# Patient Record
Sex: Female | Born: 1960 | Race: White | Hispanic: No | Marital: Married | Smoking: Current every day smoker
Health system: Southern US, Community
[De-identification: ages and names within clinical notes are randomized; demographics above are authoritative.]

## PROBLEM LIST (undated history)

## (undated) DIAGNOSIS — J449 Chronic obstructive pulmonary disease, unspecified: Secondary | ICD-10-CM

## (undated) DIAGNOSIS — R0600 Dyspnea, unspecified: Secondary | ICD-10-CM

## (undated) DIAGNOSIS — F32A Depression, unspecified: Secondary | ICD-10-CM

## (undated) DIAGNOSIS — Z923 Personal history of irradiation: Secondary | ICD-10-CM

## (undated) DIAGNOSIS — M810 Age-related osteoporosis without current pathological fracture: Secondary | ICD-10-CM

## (undated) DIAGNOSIS — T7840XA Allergy, unspecified, initial encounter: Secondary | ICD-10-CM

## (undated) DIAGNOSIS — C801 Malignant (primary) neoplasm, unspecified: Secondary | ICD-10-CM

## (undated) DIAGNOSIS — F419 Anxiety disorder, unspecified: Secondary | ICD-10-CM

## (undated) DIAGNOSIS — M797 Fibromyalgia: Secondary | ICD-10-CM

## (undated) DIAGNOSIS — K219 Gastro-esophageal reflux disease without esophagitis: Secondary | ICD-10-CM

## (undated) DIAGNOSIS — J439 Emphysema, unspecified: Secondary | ICD-10-CM

## (undated) DIAGNOSIS — M5136 Other intervertebral disc degeneration, lumbar region: Secondary | ICD-10-CM

## (undated) DIAGNOSIS — M419 Scoliosis, unspecified: Secondary | ICD-10-CM

## (undated) DIAGNOSIS — H269 Unspecified cataract: Secondary | ICD-10-CM

## (undated) DIAGNOSIS — C539 Malignant neoplasm of cervix uteri, unspecified: Secondary | ICD-10-CM

## (undated) DIAGNOSIS — F329 Major depressive disorder, single episode, unspecified: Secondary | ICD-10-CM

## (undated) DIAGNOSIS — M47812 Spondylosis without myelopathy or radiculopathy, cervical region: Secondary | ICD-10-CM

## (undated) DIAGNOSIS — M51369 Other intervertebral disc degeneration, lumbar region without mention of lumbar back pain or lower extremity pain: Secondary | ICD-10-CM

## (undated) DIAGNOSIS — G43019 Migraine without aura, intractable, without status migrainosus: Principal | ICD-10-CM

## (undated) DIAGNOSIS — M1990 Unspecified osteoarthritis, unspecified site: Secondary | ICD-10-CM

## (undated) DIAGNOSIS — I2510 Atherosclerotic heart disease of native coronary artery without angina pectoris: Secondary | ICD-10-CM

## (undated) HISTORY — DX: Chronic obstructive pulmonary disease, unspecified: J44.9

## (undated) HISTORY — PX: MULTIPLE TOOTH EXTRACTIONS: SHX2053

## (undated) HISTORY — PX: ABDOMINAL HYSTERECTOMY: SHX81

## (undated) HISTORY — DX: Age-related osteoporosis without current pathological fracture: M81.0

## (undated) HISTORY — PX: ULNAR NERVE TRANSPOSITION: SHX2595

## (undated) HISTORY — DX: Migraine without aura, intractable, without status migrainosus: G43.019

## (undated) HISTORY — DX: Major depressive disorder, single episode, unspecified: F32.9

## (undated) HISTORY — DX: Depression, unspecified: F32.A

## (undated) HISTORY — DX: Fibromyalgia: M79.7

## (undated) HISTORY — DX: Anxiety disorder, unspecified: F41.9

## (undated) HISTORY — PX: OTHER SURGICAL HISTORY: SHX169

## (undated) HISTORY — PX: COLONOSCOPY: SHX174

## (undated) HISTORY — DX: Unspecified cataract: H26.9

## (undated) HISTORY — PX: LUMBAR LAMINECTOMY: SHX95

## (undated) HISTORY — DX: Unspecified osteoarthritis, unspecified site: M19.90

## (undated) HISTORY — DX: Spondylosis without myelopathy or radiculopathy, cervical region: M47.812

## (undated) HISTORY — PX: JOINT REPLACEMENT: SHX530

## (undated) HISTORY — PX: TUBAL LIGATION: SHX77

## (undated) HISTORY — PX: CARPAL TUNNEL RELEASE: SHX101

## (undated) HISTORY — PX: SPINE SURGERY: SHX786

## (undated) HISTORY — DX: Malignant neoplasm of cervix uteri, unspecified: C53.9

## (undated) HISTORY — PX: NECK SURGERY: SHX720

## (undated) HISTORY — DX: Malignant (primary) neoplasm, unspecified: C80.1

## (undated) HISTORY — DX: Allergy, unspecified, initial encounter: T78.40XA

---

## 2001-08-16 ENCOUNTER — Ambulatory Visit (HOSPITAL_COMMUNITY): Admission: RE | Admit: 2001-08-16 | Discharge: 2001-08-16 | Payer: Self-pay | Admitting: Unknown Physician Specialty

## 2001-08-16 ENCOUNTER — Encounter: Payer: Self-pay | Admitting: Unknown Physician Specialty

## 2001-12-30 ENCOUNTER — Ambulatory Visit (HOSPITAL_COMMUNITY): Admission: RE | Admit: 2001-12-30 | Discharge: 2001-12-30 | Payer: Self-pay | Admitting: Family Medicine

## 2001-12-30 ENCOUNTER — Encounter: Payer: Self-pay | Admitting: Family Medicine

## 2003-05-16 ENCOUNTER — Encounter: Payer: Self-pay | Admitting: *Deleted

## 2003-05-16 ENCOUNTER — Emergency Department (HOSPITAL_COMMUNITY): Admission: EM | Admit: 2003-05-16 | Discharge: 2003-05-16 | Payer: Self-pay | Admitting: *Deleted

## 2004-03-17 ENCOUNTER — Inpatient Hospital Stay (HOSPITAL_COMMUNITY): Admission: RE | Admit: 2004-03-17 | Discharge: 2004-03-18 | Payer: Self-pay | Admitting: Specialist

## 2006-03-21 ENCOUNTER — Ambulatory Visit (HOSPITAL_BASED_OUTPATIENT_CLINIC_OR_DEPARTMENT_OTHER): Admission: RE | Admit: 2006-03-21 | Discharge: 2006-03-21 | Payer: Self-pay | Admitting: Orthopedic Surgery

## 2006-07-04 ENCOUNTER — Encounter (INDEPENDENT_AMBULATORY_CARE_PROVIDER_SITE_OTHER): Payer: Self-pay | Admitting: Specialist

## 2006-07-04 ENCOUNTER — Ambulatory Visit (HOSPITAL_COMMUNITY): Admission: RE | Admit: 2006-07-04 | Discharge: 2006-07-05 | Payer: Self-pay | Admitting: Specialist

## 2008-05-21 ENCOUNTER — Emergency Department (HOSPITAL_COMMUNITY): Admission: EM | Admit: 2008-05-21 | Discharge: 2008-05-21 | Payer: Self-pay | Admitting: Emergency Medicine

## 2009-03-03 ENCOUNTER — Ambulatory Visit: Admission: RE | Admit: 2009-03-03 | Discharge: 2009-03-03 | Payer: Self-pay | Admitting: Legal Medicine

## 2009-03-03 ENCOUNTER — Ambulatory Visit: Payer: Self-pay | Admitting: Vascular Surgery

## 2009-05-23 ENCOUNTER — Emergency Department (HOSPITAL_COMMUNITY): Admission: EM | Admit: 2009-05-23 | Discharge: 2009-05-24 | Payer: Self-pay | Admitting: Emergency Medicine

## 2010-03-11 ENCOUNTER — Encounter: Admission: RE | Admit: 2010-03-11 | Discharge: 2010-03-11 | Payer: Self-pay | Admitting: Specialist

## 2010-04-22 ENCOUNTER — Encounter: Admission: RE | Admit: 2010-04-22 | Discharge: 2010-04-22 | Payer: Self-pay | Admitting: Specialist

## 2010-05-25 ENCOUNTER — Ambulatory Visit (HOSPITAL_COMMUNITY): Admission: RE | Admit: 2010-05-25 | Discharge: 2010-05-27 | Payer: Self-pay | Admitting: Neurosurgery

## 2011-02-14 ENCOUNTER — Other Ambulatory Visit: Payer: Self-pay | Admitting: Neurosurgery

## 2011-02-14 DIAGNOSIS — M47812 Spondylosis without myelopathy or radiculopathy, cervical region: Secondary | ICD-10-CM

## 2011-02-14 DIAGNOSIS — M5412 Radiculopathy, cervical region: Secondary | ICD-10-CM

## 2011-02-17 ENCOUNTER — Ambulatory Visit
Admission: RE | Admit: 2011-02-17 | Discharge: 2011-02-17 | Disposition: A | Discharge status: Home / Self Care | Payer: BC Managed Care – PPO | Source: Ambulatory Visit | Attending: Neurosurgery | Admitting: Neurosurgery

## 2011-02-17 DIAGNOSIS — M47812 Spondylosis without myelopathy or radiculopathy, cervical region: Secondary | ICD-10-CM

## 2011-02-17 DIAGNOSIS — M5412 Radiculopathy, cervical region: Secondary | ICD-10-CM

## 2011-02-27 ENCOUNTER — Other Ambulatory Visit: Payer: Self-pay | Admitting: Neurosurgery

## 2011-02-27 DIAGNOSIS — M47812 Spondylosis without myelopathy or radiculopathy, cervical region: Secondary | ICD-10-CM

## 2011-02-27 DIAGNOSIS — M5412 Radiculopathy, cervical region: Secondary | ICD-10-CM

## 2011-03-08 ENCOUNTER — Other Ambulatory Visit: Payer: BC Managed Care – PPO

## 2011-03-13 ENCOUNTER — Ambulatory Visit
Admission: RE | Admit: 2011-03-13 | Discharge: 2011-03-13 | Disposition: A | Discharge status: Home / Self Care | Payer: BC Managed Care – PPO | Source: Ambulatory Visit | Attending: Neurosurgery | Admitting: Neurosurgery

## 2011-03-13 DIAGNOSIS — M47812 Spondylosis without myelopathy or radiculopathy, cervical region: Secondary | ICD-10-CM

## 2011-03-13 DIAGNOSIS — M5412 Radiculopathy, cervical region: Secondary | ICD-10-CM

## 2011-03-13 LAB — ABO/RH: ABO/RH(D): A POS

## 2011-03-13 LAB — TYPE AND SCREEN

## 2011-03-13 LAB — SURGICAL PCR SCREEN: Staphylococcus aureus: POSITIVE — AB

## 2011-03-30 ENCOUNTER — Other Ambulatory Visit: Payer: Self-pay | Admitting: Neurosurgery

## 2011-03-30 DIAGNOSIS — M47812 Spondylosis without myelopathy or radiculopathy, cervical region: Secondary | ICD-10-CM

## 2011-03-30 DIAGNOSIS — M5412 Radiculopathy, cervical region: Secondary | ICD-10-CM

## 2011-04-04 LAB — BASIC METABOLIC PANEL
BUN: 14 mg/dL (ref 6–23)
CO2: 22 mEq/L (ref 19–32)
Chloride: 107 mEq/L (ref 96–112)
Glucose, Bld: 72 mg/dL (ref 70–99)
Sodium: 138 mEq/L (ref 135–145)

## 2011-04-04 LAB — CBC
MCHC: 34.6 g/dL (ref 30.0–36.0)
Platelets: 284 10*3/uL (ref 150–400)
RBC: 3.89 MIL/uL (ref 3.87–5.11)
RDW: 14.7 % (ref 11.5–15.5)

## 2011-04-04 LAB — URINALYSIS, ROUTINE W REFLEX MICROSCOPIC
Bilirubin Urine: NEGATIVE
Protein, ur: NEGATIVE mg/dL
Specific Gravity, Urine: 1.013 (ref 1.005–1.030)

## 2011-04-04 LAB — DIFFERENTIAL
Basophils Absolute: 0.1 10*3/uL (ref 0.0–0.1)
Eosinophils Absolute: 0.1 10*3/uL (ref 0.0–0.7)
Eosinophils Relative: 1 % (ref 0–5)
Lymphocytes Relative: 22 % (ref 12–46)
Neutro Abs: 7.2 10*3/uL (ref 1.7–7.7)

## 2011-04-04 LAB — URINE MICROSCOPIC-ADD ON

## 2011-04-04 LAB — POCT PREGNANCY, URINE: Preg Test, Ur: NEGATIVE

## 2011-04-05 ENCOUNTER — Other Ambulatory Visit: Payer: Self-pay | Admitting: Gastroenterology

## 2011-04-07 ENCOUNTER — Ambulatory Visit
Admission: RE | Admit: 2011-04-07 | Discharge: 2011-04-07 | Disposition: A | Discharge status: Home / Self Care | Payer: BC Managed Care – PPO | Source: Ambulatory Visit | Attending: Gastroenterology | Admitting: Gastroenterology

## 2011-04-07 MED ORDER — IOHEXOL 300 MG/ML  SOLN
100.0000 mL | Freq: Once | INTRAMUSCULAR | Status: AC | PRN
  Administered 2011-04-07: 100 mL via INTRAVENOUS

## 2011-04-14 ENCOUNTER — Ambulatory Visit
Admission: RE | Admit: 2011-04-14 | Discharge: 2011-04-14 | Disposition: A | Discharge status: Home / Self Care | Payer: BC Managed Care – PPO | Source: Ambulatory Visit | Attending: Neurosurgery | Admitting: Neurosurgery

## 2011-04-14 DIAGNOSIS — M5412 Radiculopathy, cervical region: Secondary | ICD-10-CM

## 2011-04-14 DIAGNOSIS — M47812 Spondylosis without myelopathy or radiculopathy, cervical region: Secondary | ICD-10-CM

## 2011-05-12 NOTE — Op Note (Signed)
NAMEMILIA, WARTH                  ACCOUNT NO.:  [id number]   MEDICAL RECORD NO.:  [id number]          PATIENT TYPE:  OIB   LOCATION:  1608                         FACILITY:  [facility]   PHYSICIAN:  Jene Every, M.D.    DATE OF BIRTH:  Aug 15, 1961   DATE OF PROCEDURE:  07/04/2006  DATE OF DISCHARGE:                                 OPERATIVE REPORT   PREOPERATIVE DIAGNOSES:  1.  Spinal stenosis.  2.  Herniated nucleus pulposus at L4-5, L5-S1.   POSTOPERATIVE DIAGNOSES:  1.  Spinal stenosis.  2.  Herniated nucleus pulposus at L4-5, L5-S1.   PROCEDURE PERFORMED:  Decompression L4-5, L5-S1 right with hemilaminectomy  of L5, foraminotomies L4 and L5, microdiskectomy L4-5.   ANESTHESIA:  General.   ASSISTANT:  Roma Schanz, P.A.   BRIEF HISTORY INDICATION:  This is a 50 year old with right lower extremity  radicular pain L5 nerve root distribution, HNP noted on MRI.  She initially  responded to epidural steroid injections then had a worsening of her  condition and despite conservative treatment and an escalation of pain on  medication, she had continued pain and a positive straight leg raise and EHL  weakness.  Decreased sensation in the L5 dermatome.  MRI indicated  significant disk degeneration 4-5 and 5-1, a small fragment migrating caudal  from the 4-5 disk down to the foramen of 5.  She had minimal low back,  interestingly, had no fevers or chills.  I discussed decompression by  microdiskectomy and removal of the fragment, it was predominantly L5 leg  pain, but I did indicate she may require a decompression and a fusion at two  levels in the future, though she has disk degeneration at 3-4 and is a  smoker as well, and we elected again just to decompress it for 5, but I  indicated to Ms. Bain that decompression will most likely require evaluation  of L5 S1 as well as L4-5 due to the caudad migration of the fragment of 5.  Risks and benefits also discussed including bleeding,  infection, damage to  nerve structures, CSF leakage, epidural fibrosis, adjacent segment disease,  need for fusion in the future, and anesthetic complications, etc.   TECHNIQUE:  With the patient in the supine position, after induction of  adequate general anesthesia and 1 g of  Kefzol, she was placed prone on the  [place] frame, __________ position, all bony prominences were well padded.  The lumbar region was thoroughly prepped and draped in the usual sterile  fashion.  Two 18 gauge spinal needles were utilized to localize the 4-5 and  5-1 interspace, confirmed with an x-ray.  An incision was made from the  spinous process of 4 to just below 5.  Subcutaneous tissue was dissected.  Electrocautery was utilized to achieve hemostasis.  Dorsal lumbar fascia  identified, divided in line with the skin incision.  Just prior to this,  0.25% Marcaine with epinephrine was infiltrated in the paraspinous  musculature.  The paraspinous musculature elevated from the lamina L4 and  L5.  McCullough retractor was placed.  Operating microscope  was then  straightened, brought into the surgical field and a Penfield 4 was placed  and the interspace of 4-5 and 5-1 confirmed with x-ray.  Notable is a small  intraluminal space.  First, hemilaminotomy of the caudad edge of 5 was  performed with a 2 mm Kerrison.  Detaching the ligamentum flavum, there was  hypertrophic ligamentum and facet hypertrophy.  I performed a hemilaminotomy  of the caudad edge of 4 as well, removed the ligamentum flavum from the  interspace.  There was severe lateral recess stenosis noted due to a  combination of ligamentum flavum hypertrophy, facet hypertrophy and a  fragment that appeared to be migrating caudad.  To expose the fragment, we  placed neuropatties beneath the 5 remaining lamina and continued caudad with  the 2 mm Kerrison to expose the caudad migration.  This required the  hemilaminectomy at L5, just to trace the L5 nerve  root out.  I mobilized the  fragment with a nerve hook and a micropituitary and retrieved multiple  fragments from beneath the nerve root and out into the foramen of L5.  There  was epidural venous plexus and electrocautery was utilized to achieve  hemostasis.  At the disk space of 5 there was rupture as well and the  annular tear was noted, I completed this with an annulotomy and removed disk  material from the subannular, subposterior longitudinal ligament with a  nerve hook.  I retrieved the fragments and sent them to pathology.  This was  also retrieved from out of the foramen of L4.  The disk space was  significantly narrowed.  Some was retrieved from the disk space.  There was  no residual disk herniation after the diskectomy.  I did remove some of the  ligamentum flavum from out of the foramen of 4.  Following the foraminotomy  of 4 and 5, the hockey stick probe placed freely on the foramen of 4 and 5.  There was good excursion of the 5, at least 1 cm medial to the pedicle.  There was no stenosis cephalad.  We copiously irrigated the disk space with  antibiotic irrigation.  Ligamentum flavum was detached from the lateral edge  of the space of 5-1 as well.  We placed a hockey stick probe down the  foramen of S1.  It was found to be widely patent.  Some ligamentum flavum  inflowed within the 5 foramen and this was decompressed with a 2 mm  Kerrison, again over the root of 5, protecting the root at all times.  Again  the wound copiously irrigated.  All 4, 5 and S1 foramen were widely patent.  No evidence of active bleeding, no evidence of CSF leakage.  Thrombin-soaked  Gelfoam was placed in laminotomy defect.  McCullough retractor was removed,  paraspinous muscles inspected and with no active bleeding.  Dorsolumbar  fascia reapproximated with #1 Vicryl and interrupted figure-of-eight  sutures.  The subcutaneous tissue reapproximated with #2-0 Vicryl simple sutures.  Skin was  reapproximated with #4-0 subcuticular Prolene and  reinforced with Steri-Strips.  A sterile dressing applied.  Placed supine on  the hospital bed, extubated without difficulty and transported to the  recovery room in satisfactory condition.   The patient tolerated the procedure well, there were no complications.   SPECIMEN:  Disk material from L4-5 space.      Jene Every, M.D.  Electronically Signed     JB/MEDQ  D:  07/04/2006  T:  07/04/2006  Job:  [id number] 

## 2011-05-12 NOTE — Op Note (Signed)
[name], [name]                  ACCOUNT NO.:  [id number]   MEDICAL RECORD NO.:  [id number]          PATIENT TYPE:  AMB   LOCATION:  DSC                          FACILITY:  [facility]   PHYSICIAN:  Cindee Salt, M.D.       DATE OF BIRTH:  1961/09/04   DATE OF PROCEDURE:  03/21/2006  DATE OF DISCHARGE:                                 OPERATIVE REPORT   PREOPERATIVE DIAGNOSIS:  Fracture-dislocation, proximal interphalangeal  joint, left ring finger.   POSTOPERATIVE DIAGNOSIS:  Fracture-dislocation, proximal interphalangeal  joint, left ring finger.   OPERATION:  Hemi-hamate arthroplasty, left ring finger.   SURGEON:  Cindee Salt, M.D.   ASSISTANT:  Artist Pais. Mina Marble, M.D.   ANESTHESIA:  Axillary general.   HISTORY:  The patient is a 50 year old female who suffered a fracture-  dislocation to the PIP joint, left ring finger.  This went undiagnosed.  She  was seen approximately three months after injury.  She is admitted for hemi-  hamate arthroplasty, left ring finger.   PROCEDURE:  The patient was seen in the preoperative area, questions  answered, the area marked by both patient and surgeon, and she was taken to  the operating room after an axillary block was carried.  A general  anesthetic was then used.  She was prepped and draped using DuraPrep, supine  position, left arm free.  The limb was exsanguinated with an Esmarch  bandage.  A tourniquet placed high on the arm was inflated to 250 mmHg.  A  volar Brunner incision was made over the PIP joint, carried down through  subcutaneous tissue.  Neurovascular structures identified.  Significant  scarring near the volar plate was noted after opening the C3 pulley.  The  flexor tendons were then retracted.  The collateral ligaments were incised.  The volar plate was dissected free from the healed volar plate fracture.  This allowed the joint to be opened.  Significant scarring was present about  the entire volar aspect of the finger.   The neurovascular bundles where  released, allowing them to be retracted along with the flexor tendons, and  the joint was opened in a shotgun manner allowing visualization of the  healed volar fracture.  There were changes on the distal portion of the  proximal phalanx., But it was deemed to attempt reconstruction rather than  joint replacement.  The old fractured healed bone was removed with a small  rongeur.  The defect was then measured, found to be 1 x 5 x 3 mm in depth.  A separate incision was then made over the hamate-metacarpal articulation  transversely through the skin, the subcutaneous tissue was spread,  neurovascular structures identified and protected, retractors placed,  dissection carried between the extensor digiti quinti and extensor of the  little and ring fingers.  An incision was made longitudinally directly over  the hamate.  The hamate-metacarpal articulation was immediately apparent.  The junction between the ring and little finger metacarpal bases was  identified.  An oscillating saw was then used to cut a 1 x 5 x 3 mm  depth  graft.  The proximal aspect was then undermined making a trough allowing a  curved osteotome to be placed, and the graft was removed without difficulty.  This measured approximately 5 mm in depth.  A rongeur was then used to  remove the 2 mm, squaring this off for acceptance into the trough of the  middle phalanx.  This was placed and pinned with a 0.028 K-wire.  X-rays  confirmed positioning, and this was pinned so as to allow the articular  cartilage to be the equal but the bone to be unequal on x-ray.  This was  found to be acceptable position.  The finger was able to be put through a  range of motion, appeared stable.  Drill holes were then made for a modular  handset 1.1 mm screw.  Two 8 and one 9 mm screws were then inserted after  removal of the central K-wire.  This was removed as the last 9 mm screw.  X-  rays confirmed positioning  of the fracture fragment in good position.  The  articular surface looked very good and the joint articulated well with  approximately 100 degrees of flexion, full extension.  There was no  subluxation.  The distal aspect of the bone graft was trimmed to allow a  smoother contour to the flexor tendons.  These were repositioned.  The area  was then copiously irrigated with saline and the skin closed with  interrupted 5-0 nylon sutures.  The donor site periosteum was closed with  figure-of-eight 4-0 Vicryl and the skin with interrupted 5-0 nylon sutures.  Sterile compressive dressing, dorsal splint applied maintaining the finger  in 15 degrees of flexion at the PIP joint.  On deflation of the tourniquet,  finger immediately pinked.  She was taken to the recovery room for  observation in satisfactory condition.  She is discharged home to return to  the [facility] in one week on Vicodin and Medrol Dosepak.           ______________________________  Cindee Salt, M.D.     GK/MEDQ  D:  03/21/2006  T:  03/22/2006  Job:  [id number]

## 2011-05-12 NOTE — Op Note (Signed)
NAMECINDA, HARA                            ACCOUNT NO.:  [id number]   MEDICAL RECORD NO.:  [id number]                   PATIENT TYPE:  INP   LOCATION:  0001                                 FACILITY:  [facility]   PHYSICIAN:  Kerrin Champagne, M.D.                DATE OF BIRTH:  February 01, 1961   DATE OF PROCEDURE:  03/17/2004  DATE OF DISCHARGE:                                 OPERATIVE REPORT   PREOPERATIVE DIAGNOSES:  Left sided C5-6 and C6-7 foraminal entrapment of  the left C6 and C7 nerve roots due to spondylosis changes and degenerative  disk disease.   POSTOPERATIVE DIAGNOSES:  Left sided C5-6 and C6-7 foraminal entrapment of  the left C6 and C7 nerve roots due to spondylosis changes and degenerative  disk disease.   PROCEDURE:  Left C5-6 and left C6-7 [place] foraminotomies.   SURGEON:  Kerrin Champagne, M.D.   ASSISTANT:  Wende Neighbors, P.A.   ANESTHESIA:  GOT, Dr. Almeta Monas.   ESTIMATED BLOOD LOSS:  100 mL   DRAINS:  Foley to straight drain.   BRIEF CLINICAL NOTE:  This patient is a 50 year old female who presents with  chief complaint of severe neck pain with radiation in the left upper  extremity.  She has undergone attempts at conservative management and indeed  has undergone extensive workup.  Initially seen by Dr. Jene Every and  evaluated, underwent trigger point injection and workup of her shoulder;  however, with persistent pain, MRI scan showed apparent protrusion of the  disk at C3-4 but more significant findings of foraminal entrapment on the  left side C5-6 and C6-7.  The patient was felt to have left sided  neuroforaminal narrowing at C6-7 as well as C5-6.  She underwent EMG's and  nerve conduction studies which were inconclusive.  She had persistent  disabling axial cervical pain in the left C7 radiculopathy.  After attempts  at selective nerve root block and only temporized discomfort is brought to  the operating room to undergo left C5-6 and left C6-7  [place]  foraminotomies for decompression of the left C6 and C7 nerve roots.   FINDINGS:  The patient was found to have significant foraminal entrapment  affecting the left C6 and C7 nerve roots.   DESCRIPTION OF PROCEDURE:  After adequate general anesthesia, the patient in  a prone position, chest rolls, pressure points well-padded, a Foley catheter  placed prior to turning to a prone position and she was turned following  intubation. The neck in slight flexion of about 20-30 degrees, the face  balanced on Mayfield horseshoe well padded and well away from the orbits.  Shoulders placed backwards slightly into slight military posture with pads  under the anterior shoulders.  Draped circumferentially padding the elbows  and arms appropriately.  The patient had TED hose preoperatively to prevent  DVT.  Standard preoperative antibiotics of Ancef.  A  standard prep over the  posterior cervical spine extending from the lower occiput to T3, draped in  the usual manner, iodine Vidrape was used.  An incision in the midline from  the expected C7 spinous process cranially approximately 2-1/2 inches in  length after infiltration with Marcaine 0.5% with 1:200,000 epinephrine 7  mL.  The incision was carried sharply through the skin and subcutaneous  layers directly down to the ligament of nuchae and this was incised along  the left side of the spinous process at the expected C7, C6, C5 levels.  A  towel clip placed over the expected spinous process of C6 and an Allis clamp  over the spinous process of C5.  Intraoperative lateral radiograph was able  to demonstrate the Allis clamp on C5 and the towel clip on the C6 spinous  process. These areas were marked with colored #0 Vicryl stitch for continued  identification throughout the procedure. Carefully the paracervical muscles  were elevated off the left side of the lamina of C5, C6 and C7 and divided  off of their insertion into the inferior aspect of  the lamina of C5, C6 and  C7 exposing the left C5-6 and C6-7 posterior interlaminar regions.   Bleeders controlled using bipolar and monopolar electrocautery. McCullough  retractor inserted.  Loupe magnification and a knife was used during this  portion of the procedure. A 4 mm diamond tip bur was then used to carefully  perform small partial inferomedial laminotomy over the inferomedial aspect  of the lamina of C5 exposing the superior articular process of C6 on the  left side.  This was then carefully thinned and similarly the high speed bur  used to perform small opening over the inferomedial aspect of the lamina of  C6 at the C6-7 level.  Again taking this down to expose the superior  articular process of C7 on the left side at its superior lateral extent of  the superior lamina of C7.  A 1 mm Kerrison then able to be introduced and  used to debride a small portion of the lamina of C5 inferiorly and laterally  to the level of the C5-6 foramen.  A small portion of the superior aspect of  the lamina of C6 was resected as well to the border of the medial aspect of  the C6 pedicle.  The superior articular process of C6 then resected medially  over approximately 30-40% allowing for a decompression of the neuroforamen  on the left side at C5-6. Carefully the ligamentum flavum that had been  freed up with the partial laminectomies at each level was then lifted using  a small titanium nerve hook. The operating room microscope was draped and  brought in the field. Under direct visualization with lifting the ligamentum  flavum, the dural veins were cauterized and the ligamentum flavum carefully  elevated superiorly. This was still left in place, allowed to remain in  place as a protection from scar.  The epidural veins were then carefully  elevated, cauterized and divided over the lateral aspect of the thecal sac extending out over the left C6 nerve root dividing the fibrovascular leash,  which  was preventing the nerve from floating posterior to osteophytes within  the left C5-6 neuroforamen.   This then completed, a titanium nerve hook was then carefully used to probe  along the superomedial border of the C6 pedicle feeling the uncovertebral  osteophytes and noted no soft disk protrusion felt to be present.   The  epidural veins were cauterized using bipolar electrocautery. Thrombin  soaked Gelfoam and bone wax applied to the bleeding cancellous bone  surfaces.  This then completed, it was felt that adequate foraminotomy was  completed at C5-6, attention turned to the C6-7 level with the microscope.   The high speed bur was used to further thin the inferomedial aspect of the  inferior articular process of C6.  One and two millimeter Kerrison's then  used to perform foraminotomy on the left side resecting the superior  articular process of C7 on the left side over 30-40% of its medial portion  allowing for decompression of the left C6-7 neuroforamen. Carefully the  ligamentum flavum on the left side was then elevated using a titanium nerve  hook and bipolar electrocautery used to cauterize the epidural veins along  the left side carefully lifting and freeing the ligamentum flavum cranially.  The interval between the thecal sac and the epidural veins then developed  using titanium nerve hook and then cauterizing and dividing out over the  nerve root into the neural foramen resecting and dividing the fibrovascular  leash holding the nerve against the uncovertebral joint anteriorly.  Following this the C7 nerve root was felt to be adequately decompressed, a  large regular nerve hook could be passed easily out the neuroforamen over  the posterior aspect of the nerve root at C6 and C7.  Bleeders controlled  using electrocautery and then when these were completed bone wax applied to  bleeding cancellous bone surfaces and excess bone wax removed. Thrombin  soaked Gelfoam then placed  over both foraminotomy at these levels.  Irrigation performed.  Following irrigation, excess Gelfoam was removed.  The retractor removed, inspection demonstrated some small bleeders in the  areas of the attachment of the paracervical muscle and these were cauterized  appropriately. When no bleeding was evident, the closure was performed by  approximating the ligament of nuchae in the midline with interrupted #1  Ethibond sutures in inverted and simple fashion.  The layer just superficial  to the ligament of nuchae was also approximated with #1 Ethibond sutures.  The deep subcu layers approximated with a #0 Vicryl stitch using the UR-6  needle.  The subcu layers approximated with interrupted 2-0 Vicryl sutures,  and skin closed with inverted sutures of 4-0 Vicryl. Tincture of Benzoin and  Steri-Strips applied.   The patient had 4 x 4's affixed to the skin with Hypafix tape. She was then returned to a supine position, reactivated, extubated and returned to the  recovery room in satisfactory condition.  All instrument and sponge counts  were correct.                                               Kerrin Champagne, M.D.    Myra Rude  D:  03/17/2004  T:  03/17/2004  Job:  [id number]

## 2011-09-20 LAB — CBC
HCT: 37.7
Hemoglobin: 13
MCHC: 34.4
MCV: 94.3
Platelets: 370
RDW: 14.4

## 2011-09-20 LAB — DIFFERENTIAL
Basophils Absolute: 0
Basophils Relative: 1
Lymphocytes Relative: 28
Monocytes Absolute: 0.4
Monocytes Relative: 5
Neutro Abs: 4.9
Neutrophils Relative %: 61

## 2011-09-20 LAB — COMPREHENSIVE METABOLIC PANEL
Albumin: 3.3 — ABNORMAL LOW
Alkaline Phosphatase: 71
BUN: 6
Creatinine, Ser: 0.64
Glucose, Bld: 84
Total Protein: 5.5 — ABNORMAL LOW

## 2011-10-23 ENCOUNTER — Other Ambulatory Visit: Payer: Self-pay | Admitting: Family Medicine

## 2011-10-25 ENCOUNTER — Other Ambulatory Visit: Payer: BC Managed Care – PPO

## 2012-10-23 ENCOUNTER — Ambulatory Visit: Payer: BC Managed Care – PPO

## 2012-10-23 ENCOUNTER — Ambulatory Visit (INDEPENDENT_AMBULATORY_CARE_PROVIDER_SITE_OTHER): Payer: BC Managed Care – PPO | Admitting: Family Medicine

## 2012-10-23 VITALS — BP 128/78 | HR 85 | Temp 98.0°F | Resp 17 | Ht 64.5 in | Wt 139.0 lb

## 2012-10-23 DIAGNOSIS — M549 Dorsalgia, unspecified: Secondary | ICD-10-CM

## 2012-10-23 DIAGNOSIS — IMO0002 Reserved for concepts with insufficient information to code with codable children: Secondary | ICD-10-CM

## 2012-10-23 DIAGNOSIS — M542 Cervicalgia: Secondary | ICD-10-CM

## 2012-10-23 DIAGNOSIS — Z79899 Other long term (current) drug therapy: Secondary | ICD-10-CM

## 2012-10-23 DIAGNOSIS — Z23 Encounter for immunization: Secondary | ICD-10-CM

## 2012-10-23 LAB — POCT UA - MICROSCOPIC ONLY: Crystals, Ur, HPF, POC: NEGATIVE

## 2012-10-23 LAB — POCT URINALYSIS DIPSTICK
Bilirubin, UA: NEGATIVE
Leukocytes, UA: NEGATIVE
Spec Grav, UA: 1.025

## 2012-10-23 MED ORDER — KETOROLAC TROMETHAMINE 60 MG/2ML IM SOLN
60.0000 mg | Freq: Once | INTRAMUSCULAR | Status: AC
  Administered 2012-10-23: 60 mg via INTRAMUSCULAR

## 2012-10-23 MED ORDER — TETANUS-DIPHTH-ACELL PERTUSSIS 5-2.5-18.5 LF-MCG/0.5 IM SUSP: 0.5000 mL | Freq: Once | INTRAMUSCULAR | Status: DC

## 2012-10-23 MED ORDER — PREDNISONE 10 MG PO TABS: ORAL_TABLET | ORAL | Status: DC

## 2012-10-23 MED ORDER — TRAMADOL HCL 50 MG PO TABS: 50.0000 mg | ORAL_TABLET | Freq: Three times a day (TID) | ORAL | Status: DC | PRN

## 2012-10-23 NOTE — Progress Notes (Signed)
Subjective:    Patient ID: [name], female    DOB: [birth year], 51 y.o.   MRN: [id number] Chief Complaint  Patient presents with  . Fall  . Neck Pain    previous neck surgery  . Back Pain  . Flank Pain    HPI    Review of Systems    BP 128/78  Pulse 85  Temp(Src) 98 F (36.7 C) (Oral)  Resp 17  Ht 5' 4.5" (1.638 m)  Wt 139 lb (63.05 kg)  BMI 23.5 kg/m2  SpO2 96% Objective:   Physical Exam    [facility] reading (PRIMARY) by  Dr. Clelia Croft.     C-spine xray: loss of nml c-spine lordosis.  S/p lower c-spine fusion. No acute abnormalities seen. L-spine xray - multilever deg disc disease. No acute abnormality. UDS: + THC and opiates and benzos. Results for orders placed in visit on 10/23/12  POCT UA - MICROSCOPIC ONLY      Component Value Range   WBC, Ur, HPF, POC 2-4     RBC, urine, microscopic 1-7     Bacteria, U Microscopic trace     Mucus, UA trace     Epithelial cells, urine per micros 1-5     Crystals, Ur, HPF, POC neg     Casts, Ur, LPF, POC neg     Yeast, UA neg    POCT URINALYSIS DIPSTICK      Component Value Range   Color, UA amber     Clarity, UA clear     Glucose, UA neg     Bilirubin, UA neg     Ketones, UA trace     Spec Grav, UA 1.025     Blood, UA neg     pH, UA 6.0     Protein, UA neg     Urobilinogen, UA 1.0     Nitrite, UA neg     Leukocytes, UA Negative       Assessment & Plan:  Laceration - Plan: TDaP (BOOSTRIX) injection 0.5 mL  Neck pain - Plan: DG Cervical Spine Complete, ketorolac (TORADOL) injection 60 mg  Back pain - Plan: POCT UA - Microscopic Only, POCT urinalysis dipstick, DG Lumbar Spine Complete  Encounter for long-term (current) use of other medications  Meds ordered this encounter  Medications  . DISCONTD: SUMAtriptan (IMITREX) 5 MG/ACT nasal spray    Sig: Place 1 spray into the nose every 2 (two) hours as needed.  . gabapentin (NEURONTIN) 800 MG tablet    Sig: Take 800 mg by mouth 3 (three) times daily.  .  metaxalone (SKELAXIN) 800 MG tablet    Sig: Take 800 mg by mouth 3 (three) times daily.  . DULoxetine (CYMBALTA) 60 MG capsule    Sig: Take 60 mg by mouth daily.  Marland Kitchen zonisamide (ZONEGRAN) 100 MG capsule    Sig: Take 100 mg by mouth 2 (two) times daily.   Marland Kitchen LORazepam (ATIVAN) 2 MG tablet    Sig: Take 2 mg by mouth every 6 (six) hours as needed.  Marland Kitchen aspirin 81 MG tablet    Sig: Take 81 mg by mouth daily.  . budesonide-formoterol (SYMBICORT) 160-4.5 MCG/ACT inhaler    Sig: Inhale 2 puffs into the lungs 2 (two) times daily.  Marland Kitchen albuterol (PROVENTIL) (5 MG/ML) 0.5% nebulizer solution    Sig: Take 2.5 mg by nebulization every 6 (six) hours as needed.  . TDaP (BOOSTRIX) injection 0.5 mL    Sig:   . DISCONTD: predniSONE (DELTASONE)  10 MG tablet    Sig: Take 6 tabs po qd on d1 and 2, then 5 tabs po qd d3 and 4, 4 tabs po qd d5 and 6, 3 tabs po qd d7 and 8, 2 tabs po qd d9 and 10, 1 tab po qd d11 and 12    Dispense:  42 tablet    Refill:  0  . traMADol (ULTRAM) 50 MG tablet    Sig: Take 1 tablet (50 mg total) by mouth every 8 (eight) hours as needed for pain.    Dispense:  20 tablet    Refill:  0  . ketorolac (TORADOL) injection 60 mg    Sig:

## 2013-03-13 ENCOUNTER — Ambulatory Visit (INDEPENDENT_AMBULATORY_CARE_PROVIDER_SITE_OTHER): Payer: BC Managed Care – PPO | Admitting: Neurology

## 2013-03-13 ENCOUNTER — Encounter: Payer: Self-pay | Admitting: Neurology

## 2013-03-13 VITALS — BP 103/71 | HR 98 | Ht 63.75 in | Wt 133.0 lb

## 2013-03-13 DIAGNOSIS — G43019 Migraine without aura, intractable, without status migrainosus: Secondary | ICD-10-CM | POA: Insufficient documentation

## 2013-03-13 HISTORY — DX: Migraine without aura, intractable, without status migrainosus: G43.019

## 2013-03-13 MED ORDER — ONDANSETRON HCL 4 MG PO TABS: 4.0000 mg | ORAL_TABLET | Freq: Three times a day (TID) | ORAL | Status: DC | PRN

## 2013-03-13 NOTE — Progress Notes (Signed)
   Reason for visit: Headache  [name] is an 52 y.o. female  History of present illness:  [name] is a 52 year old right-handed white female with a history of intractable headaches. The patient indicates that she is having about 10 days without any headache every month. The patient believes that this is an improvement from what she was doing. The patient is getting some Botox injections, with the last injection on 01/13/2013. The patient may have headaches lasting up to 3 days. The patient may have nausea and vomiting with the headache. The patient is having some difficulty with sleeping. The patient returns for an evaluation.   ROS:  Out of a complete 14 system review of symptoms, the patient complains only of the following symptoms, and all other reviewed systems are negative.  Fatigue Diarrhea Constipation Easy bruising Feeling hot and cold Increased thirst Joint pain Aching muscles Allergies Runny nose Headache Insomnia    Blood pressure 103/71, pulse 98, height 5' 3.75" (1.619 m), weight 133 lb (60.328 kg).  Physical Exam  General: The patient is alert and cooperative at the time of the examination.  Skin: No significant peripheral edema is noted.   Neurologic Exam  Cranial nerves: Facial symmetry is present. Speech is normal, no aphasia or dysarthria is noted. Extraocular movements are full. Visual fields are full.  Motor: The patient has good strength in all 4 extremities.  Coordination: The patient has good finger-nose-finger and heel-to-shin bilaterally.  Gait and station: The patient has a normal gait. Tandem gait is normal. Romberg is negative. No drift is seen.  Reflexes: Deep tendon reflexes are symmetric.   Assessment/Plan:  One. Intractable headache  The patient will be maintained on Zonegran, and Imitrex. The patient was given a prescription for Zofran. The patient will continue with the Botox injections, and she will followup in 6  months.   Marlan Palau MD 03/13/2013 2:15 PM

## 2013-03-13 NOTE — Patient Instructions (Signed)
  Need to start Magnesium and riboflavin supplementation.

## 2013-03-27 ENCOUNTER — Telehealth: Payer: Self-pay | Admitting: *Deleted

## 2013-03-27 NOTE — Telephone Encounter (Signed)
Patient called stating she can't sleep and she thinks its the new muscle relaxer medication that is causing her the problem.

## 2013-03-28 NOTE — Telephone Encounter (Signed)
I called the patient multiple times on multiple days. I am unable to reach her. I have left messages. The patient will contact her office again and gives a number that she can be reached at if she still needs assistance.

## 2013-04-10 ENCOUNTER — Other Ambulatory Visit: Payer: Self-pay | Admitting: Neurology

## 2013-04-25 ENCOUNTER — Other Ambulatory Visit: Payer: Self-pay | Admitting: Neurology

## 2013-05-02 ENCOUNTER — Telehealth: Payer: Self-pay | Admitting: Neurology

## 2013-05-02 ENCOUNTER — Encounter: Payer: Self-pay | Admitting: Neurology

## 2013-05-02 NOTE — Telephone Encounter (Signed)
Patient called and wanted to provide information on efficacy of Botox. I called patient back and left message on her VM. I will try to contact her again on Monday or she can try to call me. Su Ley BS RN

## 2013-05-06 ENCOUNTER — Telehealth: Payer: Self-pay | Admitting: Neurology

## 2013-05-06 NOTE — Telephone Encounter (Signed)
I spoke with patient today to review effectiveness of Botox on migraine symptoms.   Patient reports that:  Frequency:      Before Botox: Patient was having about fifteen migraines per month that would last for days.    After Botox: She is having about half as many migraines. They may or may not last in to the next day.  Intensity:      Before Botox: Pain was sustained at a 10/10.     After Botox: Pain less than or equal to 5/10  Other Symptoms:     Before Botox: Nausea that would incapacitate her for days, keeping her in bed and unable to perform any    ADLs    After Botox: No nausea. Not confined to bed. May not be able to perform as fully as when no migraine but,    able to care for self.  When patient was taking Botox injections, she was feeling much better and thought that she might be able to manage with only her oral migraine medications. She realized that all of her previous symptoms returned with the previous severe intensity and duration. She recognized that the Botox injections significantly improved her overall health and quality of life. She returned to getting her Botox injections with very good results.   Dr. Janeal Holmes BS RN

## 2013-05-22 NOTE — Telephone Encounter (Signed)
This encounter was created in error - please disregard.

## 2013-05-26 ENCOUNTER — Telehealth: Payer: Self-pay | Admitting: Neurology

## 2013-05-26 MED ORDER — TRAZODONE HCL 100 MG PO TABS: 100.0000 mg | ORAL_TABLET | Freq: Every day | ORAL | Status: DC

## 2013-05-26 NOTE — Telephone Encounter (Signed)
Patient is requesting we prescribe Ativan.  Please advise.  Thank you.

## 2013-05-26 NOTE — Telephone Encounter (Signed)
I called patient. She was taken off of Ativan 2 weeks ago. The patient has problems sleeping. The patient was on a 2 mg tablet, taking 1 in the morning, and 2 in evening. The patient wants our office to prescribe the Ativan. I think it is best that she stay off the medication. I will call in trazodone for sleep.

## 2013-06-02 ENCOUNTER — Telehealth: Payer: Self-pay | Admitting: Neurology

## 2013-06-02 MED ORDER — ESZOPICLONE 3 MG PO TABS: 3.0000 mg | ORAL_TABLET | Freq: Every day | ORAL | Status: DC

## 2013-06-02 NOTE — Telephone Encounter (Signed)
I called the patient. The trazodone caused night mares. I will change her to lunesta.

## 2013-06-02 NOTE — Telephone Encounter (Signed)
I called pt and LMVM for her that received her message and Dr. Anne Hahn out today.  Will send message.

## 2013-06-03 ENCOUNTER — Telehealth: Payer: Self-pay | Admitting: Neurology

## 2013-06-03 MED ORDER — TIZANIDINE HCL 2 MG PO TABS: 2.0000 mg | ORAL_TABLET | Freq: Three times a day (TID) | ORAL | Status: DC

## 2013-06-03 NOTE — Telephone Encounter (Signed)
Needs her prescription called to the pharmacy.  Does not know the name of the medication, only knows it is a muscle relaxer.  Please call it to [facility] on [address].

## 2013-06-10 ENCOUNTER — Telehealth: Payer: Self-pay | Admitting: *Deleted

## 2013-06-10 MED ORDER — ZOLPIDEM TARTRATE 10 MG PO TABS: 10.0000 mg | ORAL_TABLET | Freq: Every evening | ORAL | Status: DC | PRN

## 2013-06-10 NOTE — Telephone Encounter (Signed)
Patient stated that she cannot tolerate Lunesta as it causes severe nausea.  She has stopped taking the [facility] and has slept only a couple of hours in the past 2 days. She would like to return to Lorazepam for her sleep problems.  She stated that she has no adverse reactions and she is able to sleep with Lorazepam.  Please advise.

## 2013-06-10 NOTE — Telephone Encounter (Signed)
I called patient. The patient could not tolerate the Lunesta secondary to the bad taste in the mouth. I'll try Ambien instead.

## 2013-06-10 NOTE — Telephone Encounter (Signed)
Message copied by Avie Echevaria on Tue Jun 10, 2013  4:21 PM ------      Message from: Wallace Going B      Created: Mon Jun 09, 2013  3:41 PM      Contact: PATIENT       SWITCHED MEDICATION TO ESZOPICLONE--LEAVES BAD  TASTE IN MOUTH FOR DAYS--MAKING PT SICK TO STOMACH ------

## 2013-06-18 ENCOUNTER — Encounter: Payer: Self-pay | Admitting: Neurology

## 2013-06-18 ENCOUNTER — Ambulatory Visit: Payer: BC Managed Care – PPO | Admitting: Neurology

## 2013-06-20 ENCOUNTER — Telehealth: Payer: Self-pay | Admitting: Neurology

## 2013-06-20 NOTE — Telephone Encounter (Signed)
Patient states she has had a migraine since Tuesday without no relive. Patient states its coming from the fall she took about a week ago which effected her neck. The patient wants to come in and have a xray done on her neck i let her know we don't do xray's in this office . I let the patient know Dr. Anne Hahn is out of the office and will not return un til Monday after noon, Patiented wanted to know can someone  [name] xray her here th e

## 2013-06-20 NOTE — Telephone Encounter (Signed)
Patient states she has had a migraine since Tuesday without no relive. Patient states its coming from the fall she took about a week ago which effected her neck. The patient wants to come in and have a xray done on her neck i let her know we don't do xray's in this office . I let the patient know Dr. Anne Hahn is out of the office and will not return until Monday after noon, Patient wanted to know can someone Karolee Ohs xray her here I let her know we do not xray patients here. The patient then stated can we see her for everything i let her know the doctors here will only she her for neurologic problems. She will go to urgent care to have a xray done and to get her headache addressed. Please advise on rather you would like to send her in a medication for pain.

## 2013-06-26 ENCOUNTER — Telehealth: Payer: Self-pay | Admitting: *Deleted

## 2013-06-26 DIAGNOSIS — M47812 Spondylosis without myelopathy or radiculopathy, cervical region: Secondary | ICD-10-CM

## 2013-06-26 NOTE — Telephone Encounter (Signed)
I called patient. The patient is having a lot of neck discomfort, she has gotten epidural steroid injections in the past, the last injection was in 2012. The patient has been seen by Dr. Franky Macho in the past for this. I will get another cervical epidural steroid injection set up.

## 2013-06-26 NOTE — Telephone Encounter (Signed)
Please advise 

## 2013-06-26 NOTE — Telephone Encounter (Signed)
Message copied by Hermenia Fiscal on Thu Jun 26, 2013 11:02 AM ------      Message from: Seth Bake      Created: Thu Jun 26, 2013 10:03 AM      Contact: patient       Wants to know if Dr. Anne Hahn can order an xray and a pain block shot.  Called on June 27.  Please call this morning.  In a lot of pain.  Has had a headache for over a week.  Would like to get shot today.   ------

## 2013-07-01 ENCOUNTER — Other Ambulatory Visit: Payer: Self-pay | Admitting: Neurology

## 2013-07-01 DIAGNOSIS — M47812 Spondylosis without myelopathy or radiculopathy, cervical region: Secondary | ICD-10-CM

## 2013-07-04 ENCOUNTER — Ambulatory Visit
Admission: RE | Admit: 2013-07-04 | Discharge: 2013-07-04 | Disposition: A | Discharge status: Home / Self Care | Payer: BC Managed Care – PPO | Source: Ambulatory Visit | Attending: Neurology | Admitting: Neurology

## 2013-07-04 DIAGNOSIS — M47812 Spondylosis without myelopathy or radiculopathy, cervical region: Secondary | ICD-10-CM

## 2013-07-04 MED ORDER — TRIAMCINOLONE ACETONIDE 40 MG/ML IJ SUSP (RADIOLOGY)
60.0000 mg | Freq: Once | INTRAMUSCULAR | Status: AC
  Administered 2013-07-04: 60 mg via EPIDURAL

## 2013-07-04 MED ORDER — IOHEXOL 300 MG/ML  SOLN
1.0000 mL | Freq: Once | INTRAMUSCULAR | Status: AC | PRN
  Administered 2013-07-04: 1 mL via EPIDURAL

## 2013-07-09 ENCOUNTER — Encounter: Payer: Self-pay | Admitting: Neurology

## 2013-07-09 ENCOUNTER — Ambulatory Visit (INDEPENDENT_AMBULATORY_CARE_PROVIDER_SITE_OTHER): Payer: BC Managed Care – PPO | Admitting: Neurology

## 2013-07-09 VITALS — Ht 63.0 in | Wt 128.0 lb

## 2013-07-09 DIAGNOSIS — G43019 Migraine without aura, intractable, without status migrainosus: Secondary | ICD-10-CM

## 2013-07-09 DIAGNOSIS — G43719 Chronic migraine without aura, intractable, without status migrainosus: Secondary | ICD-10-CM

## 2013-07-09 MED ORDER — ONABOTULINUMTOXINA 100 UNITS IJ SOLR
150.0000 [IU] | Freq: Once | INTRAMUSCULAR | Status: AC
  Administered 2013-07-09: 150 [IU] via INTRAMUSCULAR

## 2013-07-09 NOTE — Progress Notes (Signed)
History of Present Illness:    [name] is a 52 year old right-handed white female with a history of irritable bowel syndrome, fibromyalgia, and migraine headache. cervical spondylosis, with prior cervical spine surgery.   She is referred by Dr. Anne Green for evaluation of Botox injection  for her chronic chronic migraine prevention   She reported a history of migraine headache since 52 years old, increase over his past 6 years, she can have more than 20 migraine headaches in a month, she used up all her insurance allowed Imitrex tablet, and injection each month.   Her typical migraine are bilateral retro-orbital area severe pounding headache was associated light, noise sensitivity, nauseous, lasting half-day, or even longer.  Over the years, she has tried and failed multiple different preventative medications, Topamax, causing rash, beta blocker, with increased headaches, zonogram, Cymbalta, Wellbutrin, gabapentin, no significant improvement on her headaches.  UPDATE 01/13/2013:  she has mild improvement with previous BOTOX injection in 09/02/2012.  There was no signficant side effect noticed.  UPDATE July 16th 2014:   She reported significant improvement 80% improvement with previous Botox injection, but because of financial reasons, she was delayed coming back   Neurologic Exam  Mental Status: pleasant, awake, alert, cooperative to history, talking, and casual conversation. Cranial Nerves: Facial symmetry is present.  Speech is normal, no aphasia or dysarthria is noted.  Extraocular movements are full.  Visual fields are full. Pupils are equal, round, and reactive to light. Discs are flat bilaterally.   Motor: Patient has good strength in all 4 extremities. Sensory: Normal to light touch, pinprick, proprioception, and vibratory sensation. Coordination: Patient has good finger-to-nose and heel-to-shin bilaterally. Gait and Station: Patient has a normal gait.  Tandem gait is normal.  Romberg  is negative.  No drift is seen.   Reflexes: Deep tendon reflexes are symmetric.   Assessment and Plan: BOTOX injection was performed according to protocol by [employer]. 100 units of BOTOX was dissolved into 2 cc NS. (Lot [id number] Jan 2017)  Total of 150 units,     Corrugator 2 sites, 10 units Frontalis 4 sites,  20 units, Temporalis 8 sites,  40 units  Occipitalis 6 sites, 30 units Cervical Paraspinal, 4 sites, 20 units Trapezius, 6 sites, 30 units  Patient tolerate the injection well. Will return for repeat injection in 3 months.

## 2013-08-04 ENCOUNTER — Other Ambulatory Visit: Payer: Self-pay | Admitting: Neurology

## 2013-08-04 ENCOUNTER — Encounter: Payer: Self-pay | Admitting: Neurology

## 2013-08-04 ENCOUNTER — Ambulatory Visit (INDEPENDENT_AMBULATORY_CARE_PROVIDER_SITE_OTHER): Payer: BC Managed Care – PPO | Admitting: Neurology

## 2013-08-04 VITALS — BP 131/81 | HR 106 | Wt 125.0 lb

## 2013-08-04 DIAGNOSIS — M47812 Spondylosis without myelopathy or radiculopathy, cervical region: Secondary | ICD-10-CM | POA: Insufficient documentation

## 2013-08-04 DIAGNOSIS — G43019 Migraine without aura, intractable, without status migrainosus: Secondary | ICD-10-CM

## 2013-08-04 HISTORY — DX: Spondylosis without myelopathy or radiculopathy, cervical region: M47.812

## 2013-08-04 MED ORDER — TIZANIDINE HCL 4 MG PO TABS: 4.0000 mg | ORAL_TABLET | Freq: Three times a day (TID) | ORAL | Status: DC

## 2013-08-04 NOTE — Progress Notes (Signed)
Reason for visit: Chronic pain syndrome  [name] is an 52 y.o. female  History of present illness:  [name] is a 52 year old right-handed white female with a history of irritable bowel syndrome, fibromyalgia, migraine headache, and cervical spondylosis, status post surgery. The patient has been getting Botox injections for her frequent headaches. The patient has gained minimal benefit so far with these injections. The patient takes Imitrex if needed, and she indicates that this does help her headache. The patient has ongoing neck discomfort. The patient has previously been seen by Dr. Franky Macho, but she does not wish to go back to see him. The patient has chronic insomnia, and she has not responded to trazodone or to Lolo. The patient indicates that she does have some neck stiffness. The patient got an epidural steroid injection with transient benefit. The patient wants another injection.   Past Medical History  Diagnosis Date  . Arthritis   . Cancer   . Depression   . Anxiety   . Osteoporosis   . COPD (chronic obstructive pulmonary disease)   . Migraine without aura, with intractable migraine, so stated, without mention of status migrainosus 03/13/2013  . Fibromyalgia   . Cancer of cervix   . Cervical spondylosis without myelopathy 08/04/2013    Past Surgical History  Procedure Laterality Date  . Spine surgery    . Cesarean section    . Abdominal hysterectomy    . Tubal ligation    . Ulnar nerve transposition Left   . Lumbar laminectomy      Family History  Problem Relation Age of Onset  . Cancer Father   . Cancer Mother     Social history:  reports that she has been smoking Cigarettes.  She has a 20 pack-year smoking history. She has never used smokeless tobacco. She reports that she does not drink alcohol. Her drug history is not on file.  Allergies:  Allergies  Allergen Reactions  . Codeine Nausea Only  . Topamax (Topiramate) Hives  . Lyrica (Pregabalin)  Swelling    Medications:  Current Outpatient Prescriptions on File Prior to Visit  Medication Sig Dispense Refill  . albuterol (PROVENTIL) (5 MG/ML) 0.5% nebulizer solution Take 2.5 mg by nebulization every 6 (six) hours as needed.      . budesonide-formoterol (SYMBICORT) 160-4.5 MCG/ACT inhaler Inhale 2 puffs into the lungs 2 (two) times daily.      . DULoxetine (CYMBALTA) 60 MG capsule Take 60 mg by mouth daily.      . Magnesium Oxide -Mg Supplement 250 MG TABS Take 250 mg by mouth daily.      . ondansetron (ZOFRAN) 4 MG tablet Take 1 tablet (4 mg total) by mouth every 8 (eight) hours as needed for nausea.  30 tablet  3  . Riboflavin 400 MG TABS Take 400 mg by mouth daily.      . SUMAtriptan (IMITREX) 100 MG tablet take 1 tablet by mouth twice a day if needed as directed  9 tablet  6  . SUMAtriptan (IMITREX) 6 MG/0.5ML SOLN injection inject 1 dose subcutaneously for migraines may repeat in 1 hour IF migraines PERSISTS  4 vial  6  . vitamin C (ASCORBIC ACID) 500 MG tablet Take 500 mg by mouth daily.      Marland Kitchen zonisamide (ZONEGRAN) 100 MG capsule Take 2 capsules (200 mg total) by mouth every evening.  60 capsule  6  . zolpidem (AMBIEN) 10 MG tablet Take 1 tablet (10 mg total) by mouth  at bedtime as needed for sleep.  30 tablet  1   No current facility-administered medications on file prior to visit.    ROS:  Out of a complete 14 system review of symptoms, the patient complains only of the following symptoms, and all other reviewed systems are negative.  Weight loss, fatigue Ringing in the ears Easy bruising Feeling hot, cold Joint pain, muscle cramps Memory loss, confusion, headache, numbness, difficulty swallowing Depression, anxiety, insomnia, change in appetite, disinterest in activities, racing thoughts Restless legs  Blood pressure 131/81, pulse 106, weight 125 lb (56.7 kg).  Physical Exam  General: The patient is alert and cooperative at the time of the examination.The  patient has a flat affect.   Neuromuscular: The patient lacks about 10 of lateral rotation of the cervical spine bilaterally.   Skin: No significant peripheral edema is noted.   Neurologic Exam  Cranial nerves: Facial symmetry is present. Speech is normal, no aphasia or dysarthria is noted. Extraocular movements are full. Visual fields are full.  Motor: The patient has good strength in all 4 extremities.  Coordination: The patient has good finger-nose-finger and heel-to-shin bilaterally.  Gait and station: The patient has a normal gait. Tandem gait is normal. Romberg is negative. No drift is seen.  Reflexes: Deep tendon reflexes are symmetric.   Assessment/Plan:  One. Migraine headache  2. Fibromyalgia  3. Cervical spondylosis  The patient will be set up again for an epidural steroid injection of the cervical spine. The tizanidine will be increased to 4 mg 3 times daily. The patient has gone off of the gabapentin, and she indicates that this made no real difference with her pain. The patient likely has significant underlying depression. The patient will followup in 4 months. Botox injections will be continued.   Marlan Palau MD 08/04/2013 7:06 PM  [facility] [address] [place], [place] [address]  Phone [phone] Fax [phone]

## 2013-08-05 ENCOUNTER — Other Ambulatory Visit: Payer: Self-pay

## 2013-08-05 MED ORDER — ZOLPIDEM TARTRATE 10 MG PO TABS: 10.0000 mg | ORAL_TABLET | Freq: Every evening | ORAL | Status: DC | PRN

## 2013-08-05 NOTE — Telephone Encounter (Signed)
Rx signed and faxed.

## 2013-08-06 ENCOUNTER — Telehealth: Payer: Self-pay | Admitting: Neurology

## 2013-08-08 NOTE — Telephone Encounter (Signed)
Patient just wanted to update Dr. Anne Hahn that she takes 2 Lyrica 60mg  caps daily instead of 1 cap, and she has been having HA's since she was 52yrs old not since 4 yrs ago. No returned call needed.

## 2013-08-08 NOTE — Telephone Encounter (Signed)
Information noted. Lyrica does not come in a 60 mg tablet. I assume that she meant a 50 mg tablet.

## 2013-08-12 ENCOUNTER — Ambulatory Visit
Admission: RE | Admit: 2013-08-12 | Discharge: 2013-08-12 | Disposition: A | Discharge status: Home / Self Care | Payer: BC Managed Care – PPO | Source: Ambulatory Visit | Attending: Neurology | Admitting: Neurology

## 2013-08-12 DIAGNOSIS — M47812 Spondylosis without myelopathy or radiculopathy, cervical region: Secondary | ICD-10-CM

## 2013-08-12 MED ORDER — TRIAMCINOLONE ACETONIDE 40 MG/ML IJ SUSP (RADIOLOGY)
60.0000 mg | Freq: Once | INTRAMUSCULAR | Status: AC
  Administered 2013-08-12: 60 mg via EPIDURAL

## 2013-08-12 MED ORDER — IOHEXOL 300 MG/ML  SOLN
1.0000 mL | Freq: Once | INTRAMUSCULAR | Status: AC | PRN
  Administered 2013-08-12: 1 mL via EPIDURAL

## 2013-09-01 ENCOUNTER — Telehealth: Payer: Self-pay | Admitting: Neurology

## 2013-09-01 DIAGNOSIS — M47812 Spondylosis without myelopathy or radiculopathy, cervical region: Secondary | ICD-10-CM

## 2013-09-01 NOTE — Telephone Encounter (Signed)
I called the patient. The patient has had 2 cervical epidural steroid injection the summer. I'll get a third set up. Once this is done, the patient may have to wait 5 or 6 months to have injections done again.

## 2013-09-03 ENCOUNTER — Other Ambulatory Visit: Payer: Self-pay | Admitting: Neurology

## 2013-09-03 DIAGNOSIS — M47812 Spondylosis without myelopathy or radiculopathy, cervical region: Secondary | ICD-10-CM

## 2013-09-12 ENCOUNTER — Ambulatory Visit: Payer: BC Managed Care – PPO | Admitting: Neurology

## 2013-09-18 ENCOUNTER — Ambulatory Visit
Admission: RE | Admit: 2013-09-18 | Discharge: 2013-09-18 | Disposition: A | Discharge status: Home / Self Care | Payer: BC Managed Care – PPO | Source: Ambulatory Visit | Attending: Neurology | Admitting: Neurology

## 2013-09-18 DIAGNOSIS — M47812 Spondylosis without myelopathy or radiculopathy, cervical region: Secondary | ICD-10-CM

## 2013-09-18 MED ORDER — IOHEXOL 300 MG/ML  SOLN
1.0000 mL | Freq: Once | INTRAMUSCULAR | Status: AC | PRN
  Administered 2013-09-18: 1 mL via INTRAVENOUS

## 2013-09-18 MED ORDER — TRIAMCINOLONE ACETONIDE 40 MG/ML IJ SUSP (RADIOLOGY)
60.0000 mg | Freq: Once | INTRAMUSCULAR | Status: AC
  Administered 2013-09-18: 60 mg via EPIDURAL

## 2013-10-15 ENCOUNTER — Encounter: Payer: Self-pay | Admitting: Neurology

## 2013-10-15 ENCOUNTER — Ambulatory Visit (INDEPENDENT_AMBULATORY_CARE_PROVIDER_SITE_OTHER): Payer: BC Managed Care – PPO | Admitting: Neurology

## 2013-10-15 VITALS — BP 115/72 | HR 65 | Ht 63.0 in | Wt 131.0 lb

## 2013-10-15 DIAGNOSIS — G43909 Migraine, unspecified, not intractable, without status migrainosus: Secondary | ICD-10-CM

## 2013-10-15 DIAGNOSIS — G43719 Chronic migraine without aura, intractable, without status migrainosus: Secondary | ICD-10-CM

## 2013-10-15 MED ORDER — ONABOTULINUMTOXINA 100 UNITS IJ SOLR
150.0000 [IU] | Freq: Once | INTRAMUSCULAR | Status: AC
  Administered 2013-10-15: 150 [IU] via INTRAMUSCULAR

## 2013-10-15 NOTE — Progress Notes (Signed)
History of Present Illness:    [name] is a 52 year old right-handed white female with a history of irritable bowel syndrome, fibromyalgia, and migraine headache. cervical spondylosis, with prior cervical spine surgery.   She is referred by Dr. Anne Hahn for evaluation of Botox injection  for her chronic chronic migraine prevention   She reported a history of migraine headache since 52 years old, increase over his past 6 years, she can have more than 20 migraine headaches in a month, she used up all her insurance allowed Imitrex tablet, and injection each month.   Her typical migraine are bilateral retro-orbital area severe pounding headache was associated light, noise sensitivity, nauseous, lasting half-day, or even longer.  Over the years, she has tried and failed multiple different preventative medications, Topamax, causing rash, beta blocker, with increased headaches, zonogram, Cymbalta, Wellbutrin, gabapentin, no significant improvement on her headaches.  UPDATE 01/13/2013:  she has mild improvement with previous BOTOX injection in 09/02/2012.  There was no signficant side effect noticed.  UPDATE Oct 15 2013   She reported only mild improvement  with   Botox injection in July 2014, she has almost daily headaches in past two weeks.   Neurologic Exam  Mental Status: pleasant, awake, alert, cooperative to history, talking, and casual conversation. Cranial Nerves: Facial symmetry is present.  Speech is normal, no aphasia or dysarthria is noted.  Extraocular movements are full.  Visual fields are full. Pupils are equal, round, and reactive to light. Discs are flat bilaterally.   Motor: Patient has good strength in all 4 extremities. Sensory: Normal to light touch, pinprick, proprioception, and vibratory sensation. Coordination: Patient has good finger-to-nose and heel-to-shin bilaterally. Gait and Station: Patient has a normal gait.  Tandem gait is normal.  Romberg is negative.  No drift is  seen.   Reflexes: Deep tendon reflexes are symmetric.   Assessment and Plan: BOTOX injection was performed according to protocol by [employer]. 100 units of BOTOX was dissolved into 2 cc NS. (Lot [id number] Exp Nov 2016)  Total of 150 units,     Corrugator 2 sites, 10 units Frontalis 4 sites,  20 units, Temporalis 8 sites,  40 units  Occipitalis 6 sites, 30 units Cervical Paraspinal, 4 sites, 20 units Trapezius, 6 sites, 30 units  Patient tolerate the injection well. Will return for repeat injection in 3 months.

## 2013-10-23 ENCOUNTER — Telehealth: Payer: Self-pay | Admitting: Neurology

## 2013-10-23 DIAGNOSIS — M47812 Spondylosis without myelopathy or radiculopathy, cervical region: Secondary | ICD-10-CM

## 2013-10-23 DIAGNOSIS — G43019 Migraine without aura, intractable, without status migrainosus: Secondary | ICD-10-CM

## 2013-10-23 NOTE — Telephone Encounter (Signed)
Patient left message requesting a MRI or CT head? Because she wants to switch eye? Doctors and needs a recent scan for the referral, and she stated it has to be done at [facility] where she has an account.  She has had as many epidural as she can in last 6 months, and headaches are more frequent even with Botox.  She is switching to Dr. Channing Mutters? in [place] because her neck pain is unbearable.  I have tried to call patient to get clarity on which doctor she is seeing, but unable to reach patient.  [phone]

## 2013-10-23 NOTE — Telephone Encounter (Signed)
I called the patient and I left a message. If she has not had a recent MRI of the cervical spine, we will consider this. If a surgical amenable lesion is noted, a referral to Dr. Channing Mutters may be indicated. If not, chiropractic treatments may be recommended. The patient is not getting better with epidural steroid injections of the cervical spine and with Botox injections. The patient will contact me concerning the MRI evaluations.

## 2013-10-30 ENCOUNTER — Other Ambulatory Visit: Payer: Self-pay

## 2013-11-07 ENCOUNTER — Telehealth: Payer: Self-pay | Admitting: Neurology

## 2013-11-11 ENCOUNTER — Telehealth: Payer: Self-pay | Admitting: Neurology

## 2013-11-11 MED ORDER — MELOXICAM 15 MG PO TABS: 15.0000 mg | ORAL_TABLET | Freq: Every day | ORAL | Status: DC

## 2013-11-11 MED ORDER — SUMATRIPTAN SUCCINATE 6 MG/0.5ML ~~LOC~~ SOLN: 6.0000 mg | SUBCUTANEOUS | Status: DC | PRN

## 2013-11-11 MED ORDER — METHOCARBAMOL 500 MG PO TABS: 500.0000 mg | ORAL_TABLET | Freq: Three times a day (TID) | ORAL | Status: DC

## 2013-11-11 NOTE — Telephone Encounter (Signed)
I have just regained access to Epic.  Rx has been sent.

## 2013-11-11 NOTE — Telephone Encounter (Signed)
I called the patient. The patient wants a prescription for her meloxicam, I will call this in. She is not doing well with tizanidine, will switch to Robaxin at this point.

## 2013-11-19 ENCOUNTER — Telehealth: Payer: Self-pay | Admitting: Neurology

## 2013-11-19 NOTE — Telephone Encounter (Signed)
I called patient. The patient indicates that all time upsets her stomach. The patient has Mobic to take, and she is on Lyrica. We may need to increase the Lyrica. I cannot call in Any medication such as hydrocodone for pain. I left a message.

## 2013-11-20 ENCOUNTER — Other Ambulatory Visit: Payer: Self-pay | Admitting: Neurology

## 2013-11-29 ENCOUNTER — Other Ambulatory Visit: Payer: Self-pay | Admitting: Neurology

## 2013-12-04 ENCOUNTER — Encounter: Payer: Self-pay | Admitting: Nurse Practitioner

## 2013-12-04 ENCOUNTER — Ambulatory Visit (INDEPENDENT_AMBULATORY_CARE_PROVIDER_SITE_OTHER): Payer: BC Managed Care – PPO | Admitting: Nurse Practitioner

## 2013-12-04 VITALS — BP 122/87 | HR 109 | Ht 64.0 in | Wt 135.0 lb

## 2013-12-04 DIAGNOSIS — M47812 Spondylosis without myelopathy or radiculopathy, cervical region: Secondary | ICD-10-CM

## 2013-12-04 DIAGNOSIS — G43019 Migraine without aura, intractable, without status migrainosus: Secondary | ICD-10-CM

## 2013-12-04 MED ORDER — ZONISAMIDE 100 MG PO CAPS: 200.0000 mg | ORAL_CAPSULE | Freq: Every day | ORAL | Status: DC

## 2013-12-04 MED ORDER — TIZANIDINE HCL 4 MG PO TABS: 4.0000 mg | ORAL_TABLET | Freq: Three times a day (TID) | ORAL | Status: DC

## 2013-12-04 NOTE — Patient Instructions (Signed)
Stopped Robaxin due to stomach issues Restart tizanidine Botox every 3 months Continue Imitrex as ordered Continue Zonegran as ordered Followup with me in 4 months Given list of all migraine triggers

## 2013-12-04 NOTE — Progress Notes (Signed)
[facility]  PATIENT: [name] DOB: 1961/10/27   REASON FOR VISIT: For migraine   HISTORY OF PRESENT ILLNESS: [name], 52 year old female returns for followup she was last seen by Dr. Anne Green 08/04/2013 and had Botox by Dr. Terrace Green 10/22/ 2014. She felt the Botox is beneficial, Imitrex injections also beneficial. She went off of her tizanidine because she did not feel it was beneficial was tried on Robaxin and had stomach issues and is now back on Mobic. She wants to restart the Tizanidine.   HISTORY:  of irritable bowel syndrome, fibromyalgia, migraine headache, and cervical spondylosis, status post surgery. The patient has been getting Botox injections for her frequent headaches. The patient has gained minimal benefit so far with these injections. The patient takes Imitrex if needed, and she indicates that this does help her headache. The patient has ongoing neck discomfort. The patient has previously been seen by [name], but she does not wish to go back to see him. The patient has chronic insomnia, and she has not responded to trazodone or to [name]. The patient indicates that she does have some neck stiffness. The patient got an epidural steroid injection with transient benefit.  REVIEW OF SYSTEMS: Full 14 system review of systems performed and notable only for those listed, all others are neg:  Constitutional: fatigue Cardiovascular: N/A  Ear/Nose/Throat: neck stiffness Skin: N/A  Eyes: N/A  Respiratory: N/A  Gastroitestinal: Abdominal pain, diarrhea  Hematology/Lymphatic:  easy bruising  Endocrine: Excessive thirst Musculoskeletal:N/A  Allergy/Immunology: N/A  Neurological: Headache Psychiatric: Depression anxiety agitation  ALLERGIES: Allergies  Allergen Reactions  . Codeine Nausea Only  . Topamax [Topiramate] Hives  . Lyrica [Pregabalin] Swelling    HOME MEDICATIONS: Outpatient Prescriptions Prior to Visit  Medication Sig Dispense Refill  .  albuterol (PROVENTIL) (5 MG/ML) 0.5% nebulizer solution Take 2.5 mg by nebulization every 6 (six) hours as needed.      . budesonide-formoterol (SYMBICORT) 160-4.5 MCG/ACT inhaler Inhale 2 puffs into the lungs 2 (two) times daily.      . DULoxetine (CYMBALTA) 60 MG capsule Take 120 mg by mouth daily.       . hydrOXYzine (VISTARIL) 50 MG capsule Take 50 mg by mouth daily.      . Magnesium Oxide -Mg Supplement 250 MG TABS Take 250 mg by mouth daily.      . meloxicam (MOBIC) 15 MG tablet Take 1 tablet (15 mg total) by mouth daily.  30 tablet  1  . methocarbamol (ROBAXIN) 500 MG tablet Take 1 tablet (500 mg total) by mouth 3 (three) times daily.  90 tablet  1  . mirtazapine (REMERON) 15 MG tablet Take 15 mg by mouth daily.      . ondansetron (ZOFRAN) 4 MG tablet Take 1 tablet (4 mg total) by mouth every 8 (eight) hours as needed for nausea.  30 tablet  3  . pregabalin (LYRICA) 50 MG capsule Take 50 mg by mouth 2 (two) times daily.      . Riboflavin 400 MG TABS Take 400 mg by mouth daily.      . SUMAtriptan (IMITREX) 100 MG tablet take 1 tablet by mouth twice a day if needed as directed  9 tablet  6  . SUMAtriptan (IMITREX) 6 MG/0.5ML SOLN injection Inject 0.5 mLs (6 mg total) into the skin as needed for migraine or headache. May repeat in 2 hours if headache persists or recurs.  4 vial  6  . vitamin C (ASCORBIC ACID) 500  MG tablet Take 500 mg by mouth daily.      Marland Kitchen zonisamide (ZONEGRAN) 100 MG capsule take 2 capsules by mouth every evening  60 capsule  6  . zolpidem (AMBIEN) 10 MG tablet Take 1 tablet (10 mg total) by mouth at bedtime as needed for sleep.  30 tablet  3   No facility-administered medications prior to visit.    PAST MEDICAL HISTORY: Past Medical History  Diagnosis Date  . Arthritis   . Cancer   . Depression   . Anxiety   . Osteoporosis   . COPD (chronic obstructive pulmonary disease)   . Migraine without aura, with intractable migraine, so stated, without mention of status  migrainosus 03/13/2013  . Fibromyalgia   . Cancer of cervix   . Cervical spondylosis without myelopathy 08/04/2013    PAST SURGICAL HISTORY: Past Surgical History  Procedure Laterality Date  . Spine surgery    . Cesarean section    . Abdominal hysterectomy    . Tubal ligation    . Ulnar nerve transposition Left   . Lumbar laminectomy      FAMILY HISTORY: Family History  Problem Relation Age of Onset  . Cancer Father   . Cancer Mother     SOCIAL HISTORY: History   Social History  . Marital Status: Married    Spouse Name: [name]     Number of Children: 1  . Years of Education: 9   Occupational History  . Not on file.   Social History Main Topics  . Smoking status: Current Every Day Smoker -- 0.50 packs/day for 40 years    Types: Cigarettes  . Smokeless tobacco: Never Used  . Alcohol Use: No  . Drug Use: No  . Sexual Activity: Not on file   Other Topics Concern  . Not on file   Social History Narrative   Patient lives at home [name] her husband.    Patient has 1 child.    Patient has 9 th grade education.    Patient is currently not working.            PHYSICAL EXAM  Filed Vitals:   12/04/13 1002  BP: 122/87  Pulse: 109  Height: 5\' 4"  (1.626 m)  Weight: 135 lb (61.236 kg)   Body mass index is 23.16 kg/(m^2).  Generalized: Well developed, in no acute distress  Head: normocephalic and atraumatic,. Oropharynx benign  Neck:  no carotid bruits  Cardiac: Regular rate rhythm, no murmur  Musculoskeletal: No deformity   Neurological examination   Mentation: Alert oriented to time, place, history taking. Follows all commands speech and language fluent  Cranial nerve II-XII: Pupils were equal round reactive to light extraocular movements were full, visual field were full on confrontational test. Facial sensation and strength were normal. hearing was intact to finger rubbing bilaterally. Uvula tongue midline. head turning and shoulder shrug were normal and  symmetric.Tongue protrusion into cheek strength was normal. Motor: normal bulk and tone, full strength in the BUE, BLE, fine finger movements normal, no pronator drift. No focal weakness Coordination: finger-nose-finger, heel-to-shin bilaterally, no dysmetria Reflexes: Brachioradialis 2/2, biceps 2/2, triceps 2/2, patellar 2/2, Achilles 2/2, plantar responses were flexor bilaterally. Gait and Station: Rising up from seated position without assistance, normal stance,  moderate stride, good arm swing, smooth turning, able to perform tiptoe, and heel walking without difficulty. Tandem gait is steady  DIAGNOSTIC DATA (LABS, IMAGING, TESTING) -None to review   ASSESSMENT AND PLAN  52 y.o. year old  female  has a past medical history of Arthritis;  Depression; Anxiety; Osteoporosis; COPD (chronic obstructive pulmonary disease); Migraine without aura, with intractable migraine, so stated, without mention of status migrainosus (03/13/2013); Fibromyalgia;  and Cervical spondylosis without myelopathy (08/04/2013). here to follow up. Stopped Robaxin due to stomach issues   Restart tizanidine, will refill Botox every 3 months Continue Imitrex as ordered does not need refills  Continue Zonegran as ordered, will refill Followup with me in 4 months Given list of all migraine triggers  Nilda Riggs, [facility], [facility], APRN  [facility] [address] [place], [place] [address] [phone]

## 2013-12-04 NOTE — Progress Notes (Signed)
I have read the note, and I agree with the clinical assessment and plan.  [name] KEITH   

## 2014-01-21 ENCOUNTER — Encounter: Payer: Self-pay | Admitting: Neurology

## 2014-01-21 ENCOUNTER — Ambulatory Visit (INDEPENDENT_AMBULATORY_CARE_PROVIDER_SITE_OTHER): Payer: BC Managed Care – PPO | Admitting: Neurology

## 2014-01-21 VITALS — BP 111/73 | HR 94 | Ht 64.0 in | Wt 143.0 lb

## 2014-01-21 DIAGNOSIS — G43019 Migraine without aura, intractable, without status migrainosus: Secondary | ICD-10-CM

## 2014-01-21 DIAGNOSIS — G43719 Chronic migraine without aura, intractable, without status migrainosus: Secondary | ICD-10-CM

## 2014-01-21 MED ORDER — ONABOTULINUMTOXINA 100 UNITS IJ SOLR
150.0000 [IU] | Freq: Once | INTRAMUSCULAR | Status: AC
  Administered 2014-01-21: 150 [IU] via INTRAMUSCULAR

## 2014-01-21 NOTE — Progress Notes (Signed)
History of Present Illness:    [name] is a 53 year old right-handed white female with a history of irritable bowel syndrome, fibromyalgia, and migraine headache. cervical spondylosis, with prior cervical spine surgery.   She is referred by Dr. Jannifer Franklin for evaluation of Botox injection  for her chronic chronic migraine prevention   She reported a history of migraine headache since 53 years old, increase over his past 6 years, she can have more than 20 migraine headaches in a month, she used up all her insurance allowed Imitrex tablet, and injection each month.   Her typical migraine are bilateral retro-orbital area severe pounding headache was associated light, noise sensitivity, nauseous, lasting half-day, or even longer.  Over the years, she has tried and failed multiple different preventative medications, Topamax, causing rash, beta blocker, with increased headaches, zonogram, Cymbalta, Wellbutrin, gabapentin, no significant improvement on her headaches.  She began BOTOX injection since 2013. She noticed moderate to significant improvement. There was no signficant side effect noticed.  UPDATE January 28th 2015   She reported only moderate to significant improvement  with   Botox injection in October 2014, her headache is less, shorter lasting, responding well to Imitrex injection or tablets.   Neurologic Exam  Mental Status: pleasant, awake, alert, cooperative to history, talking, and casual conversation. Cranial Nerves: Facial symmetry is present.  Speech is normal, no aphasia or dysarthria is noted.  Extraocular movements are full.  Visual fields are full. Pupils are equal, round, and reactive to light. Discs are flat bilaterally.   Motor: Patient has good strength in all 4 extremities. Sensory: Normal to light touch, pinprick, proprioception, and vibratory sensation. Coordination: Patient has good finger-to-nose and heel-to-shin bilaterally. Gait and Station: Patient has a normal  gait.  Tandem gait is normal.  Romberg is negative.  No drift is seen.   Reflexes: Deep tendon reflexes are symmetric.   Assessment and Plan: BOTOX injection was performed according to protocol by [employer]. (Lot [id number], Exp August 2017, 100 units of BOTOX /2 cc NS. )  Total of 150 units,     Corrugator 2 sites, 10 units Frontalis 4 sites,  20 units, Temporalis 8 sites,  40 units  Occipitalis 6 sites, 30 units Cervical Paraspinal, 4 sites, 20 units Trapezius, 6 sites, 30 units  Patient tolerate the injection well. Will return for repeat injection in 3 months.

## 2014-01-26 ENCOUNTER — Other Ambulatory Visit: Payer: Self-pay

## 2014-01-26 DIAGNOSIS — Z1231 Encounter for screening mammogram for malignant neoplasm of breast: Secondary | ICD-10-CM

## 2014-02-10 ENCOUNTER — Ambulatory Visit: Payer: BC Managed Care – PPO

## 2014-03-16 ENCOUNTER — Other Ambulatory Visit: Payer: Self-pay

## 2014-03-16 DIAGNOSIS — Z803 Family history of malignant neoplasm of breast: Secondary | ICD-10-CM

## 2014-03-16 DIAGNOSIS — Z1231 Encounter for screening mammogram for malignant neoplasm of breast: Secondary | ICD-10-CM

## 2014-03-19 ENCOUNTER — Ambulatory Visit: Payer: BC Managed Care – PPO

## 2014-03-19 ENCOUNTER — Other Ambulatory Visit: Payer: Self-pay | Admitting: Family Medicine

## 2014-03-19 DIAGNOSIS — Z139 Encounter for screening, unspecified: Secondary | ICD-10-CM

## 2014-03-26 ENCOUNTER — Ambulatory Visit: Payer: BC Managed Care – PPO

## 2014-03-27 ENCOUNTER — Ambulatory Visit: Payer: BC Managed Care – PPO

## 2014-04-02 ENCOUNTER — Ambulatory Visit
Admission: RE | Admit: 2014-04-02 | Discharge: 2014-04-02 | Disposition: A | Discharge status: Home / Self Care | Payer: BC Managed Care – PPO | Source: Ambulatory Visit | Attending: Family Medicine | Admitting: Family Medicine

## 2014-04-02 DIAGNOSIS — Z139 Encounter for screening, unspecified: Secondary | ICD-10-CM

## 2014-04-09 ENCOUNTER — Telehealth: Payer: Self-pay | Admitting: Nurse Practitioner

## 2014-04-09 ENCOUNTER — Ambulatory Visit: Payer: BC Managed Care – PPO | Admitting: Nurse Practitioner

## 2014-04-09 NOTE — Telephone Encounter (Signed)
No showed for scheduled appointment 

## 2014-04-12 ENCOUNTER — Other Ambulatory Visit: Payer: Self-pay | Admitting: Neurology

## 2014-04-13 NOTE — Telephone Encounter (Signed)
Rx signed and faxed.

## 2014-04-28 ENCOUNTER — Ambulatory Visit: Payer: BC Managed Care – PPO

## 2014-04-29 ENCOUNTER — Ambulatory Visit: Payer: BC Managed Care – PPO

## 2014-05-06 ENCOUNTER — Ambulatory Visit (INDEPENDENT_AMBULATORY_CARE_PROVIDER_SITE_OTHER): Payer: BC Managed Care – PPO | Admitting: Neurology

## 2014-05-06 ENCOUNTER — Encounter: Payer: Self-pay | Admitting: Neurology

## 2014-05-06 VITALS — BP 137/88 | HR 93 | Ht 64.0 in | Wt 141.0 lb

## 2014-05-06 DIAGNOSIS — G43719 Chronic migraine without aura, intractable, without status migrainosus: Secondary | ICD-10-CM

## 2014-05-06 DIAGNOSIS — G43019 Migraine without aura, intractable, without status migrainosus: Secondary | ICD-10-CM

## 2014-05-06 DIAGNOSIS — M47812 Spondylosis without myelopathy or radiculopathy, cervical region: Secondary | ICD-10-CM

## 2014-05-06 MED ORDER — ONABOTULINUMTOXINA 100 UNITS IJ SOLR
150.0000 [IU] | Freq: Once | INTRAMUSCULAR | Status: AC
  Administered 2014-05-06: 150 [IU] via INTRAMUSCULAR

## 2014-05-06 MED ORDER — MELOXICAM 15 MG PO TABS: 15.0000 mg | ORAL_TABLET | Freq: Every day | ORAL | Status: DC

## 2014-05-06 MED ORDER — SUMATRIPTAN SUCCINATE 100 MG PO TABS: 100.0000 mg | ORAL_TABLET | ORAL | Status: DC | PRN

## 2014-05-06 MED ORDER — SUMATRIPTAN SUCCINATE 6 MG/0.5ML ~~LOC~~ SOLN: 6.0000 mg | SUBCUTANEOUS | Status: DC | PRN

## 2014-05-06 NOTE — Progress Notes (Signed)
History of Present Illness:    [name] is a 53 year old right-handed white female with a history of irritable bowel syndrome, fibromyalgia, and migraine headache. cervical spondylosis, with prior cervical spine surgery.   She is referred by Dr. Jannifer Franklin for evaluation of Botox injection  for her chronic chronic migraine prevention   She reported a history of migraine headache since 53 years old, increase over his past 6 years, she can have more than 20 migraine headaches in a month, she used up all her insurance allowed Imitrex tablet, and injection each month.   Her typical migraine are bilateral retro-orbital area severe pounding headache was associated light, noise sensitivity, nauseous, lasting half-day, or even longer.  Over the years, she has tried and failed multiple different preventative medications, Topamax, causing rash, beta blocker, with increased headaches, zonogram, Cymbalta, Wellbutrin, gabapentin, no significant improvement on her headaches.  She began BOTOX injection since 2013. She noticed moderate to significant improvement. There was no signficant side effect noticed.  UPDATE May 13th 2015   Last injection was in Jan 2015, she reported significant improvement for 2 and 1/2 month, she noticed recurrent of her headache in past one month, she is taking something almost daily for headaches, either  Imitrex tablet, or Imitrex injection, she is also taking Mobic 15 mg every day for joint pain     Neurologic Exam  Mental Status: pleasant, awake, alert, cooperative to history, talking, and casual conversation. Cranial Nerves: Facial symmetry is present.  Speech is normal, no aphasia or dysarthria is noted.  Extraocular movements are full.  Visual fields are full. Pupils are equal, round, and reactive to light. Discs are flat bilaterally.   Motor: Patient has good strength in all 4 extremities. Sensory: Normal to light touch, pinprick, proprioception, and vibratory  sensation. Coordination: Patient has good finger-to-nose and heel-to-shin bilaterally. Gait and Station: Patient has a normal gait.  Tandem gait is normal.  Romberg is negative.  No drift is seen.   Reflexes: Deep tendon reflexes are symmetric.   Assessment and Plan: BOTOX injection was performed according to protocol by [employer].   Total of 150 units,       Frontalis 6 sites,30 units, Temporalis 8 sites,  40 units  Occipitalis 6 sites, 30 units Cervical Paraspinal, 4 sites, 20 units Trapezius, 6 sites, 30 units  Patient tolerate the injection well. Will return for repeat injection in 3 months.

## 2014-05-06 NOTE — Addendum Note (Signed)
Addended by: Marcial Pacas on: 05/06/2014 02:31 PM   Modules accepted: Orders

## 2014-06-14 ENCOUNTER — Other Ambulatory Visit: Payer: Self-pay | Admitting: Neurology

## 2014-07-15 ENCOUNTER — Ambulatory Visit
Admission: RE | Admit: 2014-07-15 | Discharge: 2014-07-15 | Disposition: A | Discharge status: Home / Self Care | Payer: BC Managed Care – PPO | Source: Ambulatory Visit

## 2014-07-15 DIAGNOSIS — Z0289 Encounter for other administrative examinations: Secondary | ICD-10-CM

## 2014-07-15 DIAGNOSIS — Z1231 Encounter for screening mammogram for malignant neoplasm of breast: Secondary | ICD-10-CM

## 2014-07-15 DIAGNOSIS — Z803 Family history of malignant neoplasm of breast: Secondary | ICD-10-CM

## 2014-10-20 ENCOUNTER — Other Ambulatory Visit: Payer: Self-pay

## 2014-10-20 MED ORDER — TIZANIDINE HCL 4 MG PO TABS: 4.0000 mg | ORAL_TABLET | Freq: Three times a day (TID) | ORAL | Status: DC

## 2014-11-29 ENCOUNTER — Other Ambulatory Visit: Payer: Self-pay

## 2014-11-29 NOTE — Telephone Encounter (Signed)
error 

## 2014-12-29 ENCOUNTER — Other Ambulatory Visit: Payer: Self-pay

## 2014-12-29 MED ORDER — TIZANIDINE HCL 4 MG PO TABS: 4.0000 mg | ORAL_TABLET | Freq: Three times a day (TID) | ORAL | Status: DC

## 2014-12-29 MED ORDER — ZONISAMIDE 100 MG PO CAPS: 200.0000 mg | ORAL_CAPSULE | Freq: Every day | ORAL | Status: DC

## 2014-12-29 MED ORDER — SUMATRIPTAN SUCCINATE 100 MG PO TABS: 100.0000 mg | ORAL_TABLET | ORAL | Status: DC | PRN

## 2015-01-11 ENCOUNTER — Other Ambulatory Visit: Payer: Self-pay | Admitting: Neurology

## 2015-01-28 ENCOUNTER — Other Ambulatory Visit: Payer: Self-pay | Admitting: Neurology

## 2015-01-28 NOTE — Telephone Encounter (Signed)
Patient has not been seen for regular OV in over 1 year.  No showed last OV scheduled.  Called, got no answer.  Left message.

## 2015-02-21 ENCOUNTER — Other Ambulatory Visit: Payer: Self-pay | Admitting: Neurology

## 2015-03-02 ENCOUNTER — Other Ambulatory Visit: Payer: Self-pay | Admitting: Neurology

## 2015-06-29 ENCOUNTER — Other Ambulatory Visit: Payer: Self-pay | Admitting: Neurology

## 2015-11-16 ENCOUNTER — Ambulatory Visit: Payer: Self-pay | Admitting: Neurology

## 2018-10-17 ENCOUNTER — Ambulatory Visit: Payer: 59 | Admitting: Physician Assistant

## 2018-10-17 ENCOUNTER — Encounter: Payer: Self-pay | Admitting: Physician Assistant

## 2018-10-17 ENCOUNTER — Other Ambulatory Visit: Payer: Self-pay

## 2018-10-17 ENCOUNTER — Ambulatory Visit (INDEPENDENT_AMBULATORY_CARE_PROVIDER_SITE_OTHER): Payer: 59

## 2018-10-17 VITALS — BP 136/82 | HR 96 | Temp 99.0°F | Resp 16 | Ht 63.0 in | Wt 143.0 lb

## 2018-10-17 DIAGNOSIS — M25551 Pain in right hip: Secondary | ICD-10-CM

## 2018-10-17 NOTE — Patient Instructions (Addendum)
Your x-ray shows arthritis of your hip.   You will receive a phone call to schedule an appointment with orthopedics.   Thank you for coming in today. I hope you feel we met your needs.  Feel free to call PCP if you have any questions or further requests.  Please consider signing up for MyChart if you do not already have it, as this is a great way to communicate with me.  Best,  Whitney McVey, PA-C  IF you received an x-ray today, you will receive an invoice from [facility]. Please contact [facility] at [phone] with questions or concerns regarding your invoice.   IF you received labwork today, you will receive an invoice from [facility]. Please contact [facility] at [phone] with questions or concerns regarding your invoice.   Our billing staff will not be able to assist you with questions regarding bills from these companies.  You will be contacted with the lab results as soon as they are available. The fastest way to get your results is to activate your My Chart account. Instructions are located on the last page of this paperwork. If you have not heard from [place] regarding the results in 2 weeks, please contact this office.

## 2018-10-17 NOTE — Progress Notes (Signed)
[name]  MRN: [id number] DOB: 1961/05/05  PCP: Leonard Downing, MD  Subjective:  Pt is a 57 year old female who presents to clinic for right hip pain x 3 weeks. She is here today with her husband.  Pain is of her groin and radiates to low right back No MOI. She is using cane to walk.  She has been dragging/not picking up her feet as well in the past several weeks. She has fallen 2x in the past few weeks.   Sees Dr. Deirdre Evener neurology q 3 months. Next appt is next month.  She takes oxycodone for pain. Rx from Neurologist.   Review of Systems  Genitourinary: Negative for dysuria, flank pain, frequency, urgency and vaginal discharge.  Musculoskeletal: Positive for arthralgias (right hip).    Patient Active Problem List   Diagnosis Date Noted  . Cervical spondylosis without myelopathy 08/04/2013  . Migraine without aura, with intractable migraine, so stated, without mention of status migrainosus 03/13/2013    Current Outpatient Medications on File Prior to Visit  Medication Sig Dispense Refill  . albuterol (PROVENTIL) (5 MG/ML) 0.5% nebulizer solution Take 2.5 mg by nebulization every 6 (six) hours as needed.    . budesonide-formoterol (SYMBICORT) 160-4.5 MCG/ACT inhaler Inhale 2 puffs into the lungs 2 (two) times daily.    . diclofenac (VOLTAREN) 75 MG EC tablet Take 75 mg by mouth 2 (two) times daily.    Eduard Roux (AIMOVIG, 140 MG DOSE, [place]) Inject into the skin.    . hydrOXYzine (VISTARIL) 50 MG capsule Take 50 mg by mouth daily.    Marland Kitchen lamoTRIgine (LAMICTAL) 100 MG tablet Take 100 mg by mouth daily.    . nortriptyline (PAMELOR) 75 MG capsule Take 75 mg by mouth at bedtime.    Marland Kitchen omeprazole (PRILOSEC) 20 MG capsule Take 20 mg by mouth daily.    Marland Kitchen oxyCODONE (OXY IR/ROXICODONE) 5 MG immediate release tablet     . promethazine (PHENERGAN) 25 MG tablet Take 25 mg by mouth every 6 (six) hours as needed for nausea or vomiting.    . SUMAtriptan (IMITREX) 100 MG tablet  TAKE 1 TABLET (100 MG TOTAL) BY MOUTH AS NEEDED FOR MIGRAINE. 9 tablet 0  . SUMAtriptan (IMITREX) 6 MG/0.5ML SOLN injection Inject 0.5 mLs (6 mg total) into the skin as needed for migraine or headache. May repeat in 2 hours if headache persists or recurs. 8 vial 12  . BUTRANS 15 MCG/HR PTWK     . diazepam (VALIUM) 5 MG tablet     . DULoxetine (CYMBALTA) 60 MG capsule Take 120 mg by mouth daily.     . fentaNYL (DURAGESIC - DOSED MCG/HR) 25 MCG/HR patch     . Magnesium Oxide -Mg Supplement 250 MG TABS Take 250 mg by mouth daily.    . meloxicam (MOBIC) 15 MG tablet Take 1 tablet (15 mg total) by mouth daily. (Patient not taking: Reported on 10/17/2018) 30 tablet 12  . ondansetron (ZOFRAN) 4 MG tablet Take 1 tablet (4 mg total) by mouth every 8 (eight) hours as needed for nausea. (Patient not taking: Reported on 10/17/2018) 30 tablet 3  . Riboflavin 400 MG TABS Take 400 mg by mouth daily.    Marland Kitchen tiZANidine (ZANAFLEX) 4 MG tablet Take 1 tablet (4 mg total) by mouth 3 (three) times daily. 90 tablet 0  . vitamin C (ASCORBIC ACID) 500 MG tablet Take 500 mg by mouth daily.    Marland Kitchen zonisamide (ZONEGRAN) 100 MG capsule TAKE  2 CAPSULES (200 MG TOTAL) BY MOUTH DAILY. 30 capsule 0   No current facility-administered medications on file prior to visit.     Allergies  Allergen Reactions  . Codeine Nausea Only  . Topamax [Topiramate] Hives  . Lyrica [Pregabalin] Swelling     Objective:  BP 136/82 (BP Location: Left Arm, Patient Position: Sitting, Cuff Size: Normal)   Pulse 96   Temp 99 F (37.2 C) (Oral)   Resp 16   Ht 5\' 3"  (1.6 m)   Wt 143 lb (64.9 kg)   SpO2 99%   BMI 25.33 kg/m   Physical Exam Vitals signs reviewed.  Constitutional:      Appearance: Normal appearance.  Abdominal:     Tenderness: There is no right CVA tenderness or left CVA tenderness.  Musculoskeletal:     Right hip: She exhibits tenderness and bony tenderness. She exhibits normal range of motion and normal strength.      Lumbar back: She exhibits normal range of motion, no tenderness and no bony tenderness.  Neurological:     Mental Status: She is alert.     Dg Hip Unilat W Or W/o Pelvis 2-3 Views Right  Result Date: 10/17/2018 CLINICAL DATA:  RIGHT hip pain for 3 weeks. No known injury. Initial encounter. EXAM: DG HIP (WITH OR WITHOUT PELVIS) 2-3V RIGHT COMPARISON:  None. FINDINGS: Mild SUPERIOR joint space narrowing and juxta-articular sclerosis of the RIGHT hip noted. No acute fracture, subluxation or dislocation. No suspicious focal bony lesions are identified. Degenerative changes in the LOWER lumbar spine are noted. IMPRESSION: 1. No acute abnormality 2. Mild degenerative changes in the RIGHT hip 3. Degenerative changes in the LOWER lumbar spine. Electronically Signed   By: Margarette Canada M.D.   On: 10/17/2018 14:57    Assessment and Plan :  1. Right hip pain - pt presents with worsening right hip pain. No red flags. X-ray significant for DDD of right hip and lumbar spine. Plan to refer to ortho for evaluation.  - DG HIP UNILAT W OR W/O PELVIS 2-3 VIEWS RIGHT; Future - Ambulatory referral to Orthopedic Surgery   Mercer Pod, PA-C  [facility] 10/17/2018 2:20 PM  Please note: Portions of this report may have been transcribed using dragon voice recognition software. Every effort was made to ensure accuracy; however, inadvertent computerized transcription errors may be present.

## 2018-11-01 ENCOUNTER — Ambulatory Visit (INDEPENDENT_AMBULATORY_CARE_PROVIDER_SITE_OTHER): Payer: 59 | Admitting: Orthopaedic Surgery

## 2018-11-04 ENCOUNTER — Ambulatory Visit (INDEPENDENT_AMBULATORY_CARE_PROVIDER_SITE_OTHER): Payer: 59 | Admitting: Orthopaedic Surgery

## 2018-11-18 ENCOUNTER — Ambulatory Visit (INDEPENDENT_AMBULATORY_CARE_PROVIDER_SITE_OTHER): Payer: 59 | Admitting: Orthopaedic Surgery

## 2018-12-02 ENCOUNTER — Encounter (INDEPENDENT_AMBULATORY_CARE_PROVIDER_SITE_OTHER): Payer: Self-pay

## 2018-12-02 ENCOUNTER — Encounter (INDEPENDENT_AMBULATORY_CARE_PROVIDER_SITE_OTHER): Payer: Self-pay | Admitting: Orthopaedic Surgery

## 2018-12-02 ENCOUNTER — Ambulatory Visit (INDEPENDENT_AMBULATORY_CARE_PROVIDER_SITE_OTHER): Payer: 59 | Admitting: Orthopaedic Surgery

## 2018-12-02 VITALS — BP 122/84 | HR 103 | Ht 64.0 in | Wt 143.0 lb

## 2018-12-02 DIAGNOSIS — M25551 Pain in right hip: Secondary | ICD-10-CM

## 2018-12-02 NOTE — Progress Notes (Signed)
Office Visit Note   Patient: [name]           Date of Birth: 01-19-1961           MRN: [id number] Visit Date: 12/02/2018              Requested by: Dorise Hiss, PA-C [place], [place] [address] PCP: Leonard Downing, MD   Assessment & Plan: Visit Diagnoses:  1. Pain in right hip   2. Pain of right hip joint     Plan: Primary osteoarthritis right hip.  Long discussion regarding her arthritis and treatment options including use of NSAIDs.  Will order intra-articular cortisone injection and re evaluate in 1 month.  Possible that some of her pain could be referred from her back Follow-Up Instructions: Return in about 1 month (around 01/02/2019).   Orders:  Orders Placed This Encounter  Procedures  . DL FLUORO GUIDED NEEDLE PLC ASPIRATION / INJECTTION/LOC   No orders of the defined types were placed in this encounter.     Procedures: No procedures performed   Clinical Data: No additional findings.   Subjective: Chief Complaint  Patient presents with  . Right Hip - Pain  . Hip Pain    Right hip pain, pain and burning sensation in groin. No radiating pain. Previously had 3 falls before pain started 2 months ago. Dilaudid.   [name] is accompanied by her husband and here for evaluation of right groin pain.  She first noted insidious onset about 2 months ago 3 or trauma.  She has had films of her right hip through her primary care physician revealing mild degenerative changes in the right hip.  There are mild degenerative changes in the lumbar spine.  She has tried over-the-counter NSAIDs with good relief.  No numbness or tingling. Did review the films of her right hip on the PACS system.  There were some cystic changes on both sides of the right hip joint with some mild narrowing superiorly.  There are also areas of subchondral sclerosis.  There is similar findings on the left side to a lesser extent which is asymptomatic.  There are degenerative  changes of the lumbar spine  HPI  Review of Systems   Objective: Vital Signs: BP 122/84 (BP Location: Left Arm, Patient Position: Sitting, Cuff Size: Normal)   Pulse (!) 103   Ht 5\' 4"  (1.626 m)   Wt 143 lb (64.9 kg)   BMI 24.55 kg/m   Physical Exam  Constitutional: She is oriented to person, place, and time. She appears well-developed and well-nourished.  HENT:  Mouth/Throat: Oropharynx is clear and moist.  Eyes: Pupils are equal, round, and reactive to light. EOM are normal.  Pulmonary/Chest: Effort normal.  Neurological: She is alert and oriented to person, place, and time.  Skin: Skin is warm and dry.  Psychiatric: She has a normal mood and affect. Her behavior is normal.    Ortho Exam awake alert and oriented x3.  Comfortable sitting.  Walks with a very minimal limp referable to her right hip.  She does have some pain in the extreme of external rotation of her right hip.  No thigh pain.  No knee pain.  Straight leg raise negative.  Neurovascular exam intact.  Specialty Comments:  No specialty comments available.  Imaging: No results found.   PMFS History: Patient Active Problem List   Diagnosis Date Noted  . Cervical spondylosis without myelopathy 08/04/2013  . Migraine without aura,  with intractable migraine, so stated, without mention of status migrainosus 03/13/2013   Past Medical History:  Diagnosis Date  . Anxiety   . Arthritis   . Cancer ([place])   . Cancer of cervix ([place])   . Cervical spondylosis without myelopathy 08/04/2013  . COPD (chronic obstructive pulmonary disease) ([place])   . Depression   . Fibromyalgia   . Migraine without aura, with intractable migraine, so stated, without mention of status migrainosus 03/13/2013  . Osteoporosis     Family History  Problem Relation Age of Onset  . Cancer Father   . Cancer Mother     Past Surgical History:  Procedure Laterality Date  . ABDOMINAL HYSTERECTOMY    . CESAREAN SECTION    . LUMBAR LAMINECTOMY      . SPINE SURGERY    . TUBAL LIGATION    . ULNAR NERVE TRANSPOSITION Left    Social History   Occupational History    Comment: Does not work  Tobacco Use  . Smoking status: Current Every Day Smoker    Packs/day: 0.50    Years: 40.00    Pack years: 20.00    Types: Cigarettes  . Smokeless tobacco: Never Used  Substance and Sexual Activity  . Alcohol use: No  . Drug use: No  . Sexual activity: Not on file     Garald Balding, MD   Note - This record has been created using [employer].  Chart creation errors have been sought, but may not always  have been located. Such creation errors do not reflect on  the standard of medical care.

## 2018-12-16 ENCOUNTER — Ambulatory Visit
Admission: RE | Admit: 2018-12-16 | Discharge: 2018-12-16 | Disposition: A | Discharge status: Home / Self Care | Payer: 59 | Source: Ambulatory Visit | Attending: Orthopaedic Surgery | Admitting: Orthopaedic Surgery

## 2018-12-16 DIAGNOSIS — M25551 Pain in right hip: Secondary | ICD-10-CM

## 2018-12-16 MED ORDER — IOPAMIDOL (ISOVUE-M 200) INJECTION 41%
1.0000 mL | Freq: Once | INTRAMUSCULAR | Status: AC
  Administered 2018-12-16: 1 mL via INTRA_ARTICULAR

## 2018-12-16 MED ORDER — METHYLPREDNISOLONE ACETATE 40 MG/ML INJ SUSP (RADIOLOG
120.0000 mg | Freq: Once | INTRAMUSCULAR | Status: AC
  Administered 2018-12-16: 120 mg via INTRA_ARTICULAR

## 2018-12-31 ENCOUNTER — Ambulatory Visit (INDEPENDENT_AMBULATORY_CARE_PROVIDER_SITE_OTHER): Payer: 59 | Admitting: Orthopaedic Surgery

## 2019-03-26 ENCOUNTER — Telehealth (INDEPENDENT_AMBULATORY_CARE_PROVIDER_SITE_OTHER): Payer: Self-pay | Admitting: Orthopaedic Surgery

## 2019-03-26 NOTE — Telephone Encounter (Signed)
[name] left a voicemail stating she had right hip injection at [facility] in December.  [name] requested a return call to let her know if Dr. Durward Fortes can send another referral or if she needs to be seen in the office first.

## 2019-03-27 ENCOUNTER — Other Ambulatory Visit: Payer: Self-pay | Admitting: *Deleted

## 2019-03-27 DIAGNOSIS — M25551 Pain in right hip: Secondary | ICD-10-CM

## 2019-03-27 NOTE — Telephone Encounter (Signed)
Order placed for right hip injection at [place], [facility] for [place], I called patient. Patient is on Medrol dose pack for another illness. Dr. Durward Fortes advised patient to take Aleve or Advil.

## 2019-10-16 ENCOUNTER — Institutional Professional Consult (permissible substitution): Payer: 59 | Admitting: Pulmonary Disease

## 2019-10-28 ENCOUNTER — Other Ambulatory Visit: Payer: Self-pay

## 2019-10-28 ENCOUNTER — Ambulatory Visit (INDEPENDENT_AMBULATORY_CARE_PROVIDER_SITE_OTHER): Payer: 59

## 2019-10-28 ENCOUNTER — Ambulatory Visit: Payer: 59 | Admitting: Pulmonary Disease

## 2019-10-28 ENCOUNTER — Encounter: Payer: Self-pay | Admitting: Pulmonary Disease

## 2019-10-28 DIAGNOSIS — J449 Chronic obstructive pulmonary disease, unspecified: Secondary | ICD-10-CM | POA: Diagnosis not present

## 2019-10-28 DIAGNOSIS — Z720 Tobacco use: Secondary | ICD-10-CM | POA: Insufficient documentation

## 2019-10-28 MED ORDER — BUPROPION HCL ER (XL) 150 MG PO TB24: 150.0000 mg | ORAL_TABLET | Freq: Two times a day (BID) | ORAL | 0 refills | Status: DC

## 2019-10-28 MED ORDER — SPIRIVA RESPIMAT 1.25 MCG/ACT IN AERS: 2.0000 | INHALATION_SPRAY | Freq: Every day | RESPIRATORY_TRACT | 0 refills | Status: DC

## 2019-10-28 NOTE — Progress Notes (Signed)
Subjective:    Patient ID: [name], female    DOB: 1961-11-13, 58 y.o.   MRN: [id number]  HPI  Chief Complaint  Patient presents with  . Consult    Consult for COPD. Reports he was dx with COPD about 5 years ago. She reports coughing up alot of mucous and alot fo wheezing. Using symbicort daily and ventolin. Reports she smokes but she does not inhaler and she vapes from time to time.    58 year old smoker presents for evaluation of COPD to establish care. She reports repeated episodes of bronchitis every 6 to 8 weeks for the past 6 months, she has required Z-Pak and steroids to overcome this.  Steroids to work but she does not like the feeling it gives her.  She denies sputum production, reports class II dyspnea, sometimes on daily activities such as making of her bed. She smokes about a pack per day and states that she does not inhale.  Her husband also smokes and they smoke inside the house.  She has been maintained on a regimen of Symbicort for many years.  She has chronic pain in her neck and back due to which she is disabled for the last 8 years.  She was recently taken off oxycodone and changed to Dilaudid.  She is not taking fentanyl patch and Butrans patches anymore and I discontinued this from the [facility].   She is hesitant to take the flu shot.  She is somewhat concerned about lung cancer. She is also maintain albuterol nebs which she thinks helps but does not feel that albuterol MDI helps much       Past Medical History:  Diagnosis Date  . Anxiety   . Arthritis   . Cancer ([place])   . Cancer of cervix ([place])   . Cervical spondylosis without myelopathy 08/04/2013  . COPD (chronic obstructive pulmonary disease) ([place])   . Depression   . Fibromyalgia   . Migraine without aura, with intractable migraine, so stated, without mention of status migrainosus 03/13/2013  . Osteoporosis    Past Surgical History:  Procedure Laterality Date  . ABDOMINAL HYSTERECTOMY    . CESAREAN  SECTION    . LUMBAR LAMINECTOMY    . SPINE SURGERY    . TUBAL LIGATION    . ULNAR NERVE TRANSPOSITION Left     Allergies  Allergen Reactions  . Codeine Nausea Only  . Topamax [Topiramate] Hives  . Lyrica [Pregabalin] Swelling    Social History   Socioeconomic History  . Marital status: Married    Spouse name: Francee Piccolo   . Number of children: 1  . Years of education: 16  . Highest education level: Not on file  Occupational History    Comment: Does not work  Social Needs  . Financial resource strain: Not on file  . Food insecurity    Worry: Not on file    Inability: Not on file  . Transportation needs    Medical: Not on file    Non-medical: Not on file  Tobacco Use  . Smoking status: Current Every Day Smoker    Packs/day: 0.50    Years: 40.00    Pack years: 20.00    Types: Cigarettes  . Smokeless tobacco: Never Used  Substance and Sexual Activity  . Alcohol use: No  . Drug use: No  . Sexual activity: Not on file  Lifestyle  . Physical activity    Days per week: Not on file    Minutes per  session: Not on file  . Stress: Not on file  Relationships  . Social Herbalist on phone: Not on file    Gets together: Not on file    Attends religious service: Not on file    Active member of club or organization: Not on file    Attends meetings of clubs or organizations: Not on file    Relationship status: Not on file  . Intimate partner violence    Fear of current or ex partner: Not on file    Emotionally abused: Not on file    Physically abused: Not on file    Forced sexual activity: Not on file  Other Topics Concern  . Not on file  Social History Narrative   Patient lives at home Francee Piccolo her husband.    Patient has 1 child.    Patient has 9 th grade education.    Patient is currently not currently working.               Family History  Problem Relation Age of Onset  . Cancer Father   . Cancer Mother      Review of Systems Constitutional:  negative for anorexia, fevers and sweats  Eyes: negative for irritation, redness and visual disturbance  Ears, nose, mouth, throat, and face: negative for earaches, epistaxis, nasal congestion and sore throat  Respiratory: negative for sputum and wheezing occasional white sputum Cardiovascular: negative for chest pain, lower extremity edema, orthopnea, palpitations and syncope  Gastrointestinal: negative for abdominal pain, constipation, diarrhea, melena, nausea and vomiting  Genitourinary:negative for dysuria, frequency and hematuria  Hematologic/lymphatic: negative for bleeding, easy bruising and lymphadenopathy  Musculoskeletal:negative for arthralgias, muscle weakness and stiff joints  Neurological: negative for coordination problems, gait problems and weakness positive for headaches Endocrine: negative for diabetic symptoms including polydipsia, polyuria and weight loss     Objective:   Physical Exam  Gen. Pleasant, well-nourished, in no distress, anxious affect ENT - no pallor,icterus, no post nasal drip Neck: No JVD, no thyromegaly, no carotid bruits Lungs: no use of accessory muscles, no dullness to percussion, clear without rales or rhonchi  Cardiovascular: Rhythm regular, heart sounds  normal, no murmurs or gallops, no peripheral edema Abdomen: soft and non-tender, no hepatosplenomegaly, BS normal. Musculoskeletal: No deformities, no cyanosis or clubbing Neuro:  alert, non focal       Assessment & Plan:

## 2019-10-28 NOTE — Assessment & Plan Note (Signed)
Smoking cessation is the most important intervention for you. Trial of Wellbutrin 150 twice daily # 30 tabs -call for refill if this works or help to cut down smoking.

## 2019-10-28 NOTE — Patient Instructions (Signed)
Smoking cessation is the most important intervention for you. Trial of Wellbutrin 150 twice daily # 30 tabs -call for refill if this works or help to cut down smoking.  Flu shot advised  Trial of Spiriva once daily-call for prescription if this works.  Chest x-ray today.  Referred to lung cancer screening program

## 2019-10-28 NOTE — Assessment & Plan Note (Signed)
Flu shot advised  Trial of Spiriva once daily-call for prescription if this works.  Chest x-ray today.  Referred to lung cancer screening program PFTs

## 2019-11-25 ENCOUNTER — Telehealth: Payer: Self-pay | Admitting: Acute Care

## 2019-11-25 ENCOUNTER — Telehealth: Payer: Self-pay | Admitting: Pulmonary Disease

## 2019-11-25 MED ORDER — SPIRIVA RESPIMAT 1.25 MCG/ACT IN AERS: 2.0000 | INHALATION_SPRAY | Freq: Every day | RESPIRATORY_TRACT | 5 refills | Status: DC

## 2019-11-25 NOTE — Telephone Encounter (Signed)
I called and spoke with the patient and she states that [facility] told her husband when he went to get her scripts that all three(Ventolin, Symbicort and Spiriva) has interactions when taken together. I advised her that this is fine as the doctor is aware of this when they are prescribed and would not place her in any danger. I advised her that the Ventolin is as needed. She verbalized understanding and states that she didn't think it was a problem but wanted to double check. Nothing further is needed.

## 2019-11-25 NOTE — Telephone Encounter (Signed)
Pt needs to speak a nurse about her inhaler medications. Stated that the pharmacy told her that there might be a drug interaction with some of the meds. Please advise

## 2019-11-25 NOTE — Telephone Encounter (Signed)
Called and spoke with pt who is requesting to have an Rx of spiriva sent to pharmacy due to it working well for her. Verified pt's preferred pharmacy and sent Rx in. Nothing further needed.

## 2019-11-26 NOTE — Telephone Encounter (Signed)
LMTC x 1  

## 2019-11-26 NOTE — Telephone Encounter (Signed)
[place] x 1 - will close this message and refer to other telephone note from 11/25/19

## 2019-12-01 ENCOUNTER — Telehealth: Payer: Self-pay | Admitting: Primary Care

## 2019-12-01 NOTE — Telephone Encounter (Signed)
LMTC x 1 - Will close this message and refer to referral notes 

## 2019-12-02 NOTE — Telephone Encounter (Signed)
LMTC x 1  

## 2019-12-08 NOTE — Telephone Encounter (Signed)
[name] does this pt need to be scheduled for lung cancer screening? Please advise.

## 2019-12-08 NOTE — Telephone Encounter (Signed)
LMTC x 1 - Will close this message and refer to referral notes 

## 2019-12-09 ENCOUNTER — Telehealth: Payer: Self-pay | Admitting: Primary Care

## 2019-12-09 DIAGNOSIS — F17210 Nicotine dependence, cigarettes, uncomplicated: Secondary | ICD-10-CM

## 2019-12-10 NOTE — Telephone Encounter (Signed)
Spoke with pt and scheduled SDMV 12/29/19 1:30 CT ordered Nothing further needed

## 2019-12-29 ENCOUNTER — Encounter: Payer: 59 | Admitting: Acute Care

## 2019-12-29 ENCOUNTER — Inpatient Hospital Stay: Admission: RE | Admit: 2019-12-29 | Payer: 59 | Source: Ambulatory Visit

## 2020-01-01 ENCOUNTER — Telehealth: Payer: Self-pay | Admitting: Acute Care

## 2020-01-01 NOTE — Telephone Encounter (Signed)
LMTC x 1  

## 2020-01-05 NOTE — Telephone Encounter (Signed)
Spoke with pt and rescheduled [facility] 01/19/20 2:30.  CT will be rescheduled Nothing further needed

## 2020-01-16 ENCOUNTER — Telehealth: Payer: Self-pay | Admitting: Acute Care

## 2020-01-16 NOTE — Telephone Encounter (Signed)
Called pt today to do her COVID screening. States that she has been exposed to someone who tested positive for COVID. Pt is going today to be tested.  Judson Roch - please advise what we need to do about her SDMV and Lung screening CT. Thanks.

## 2020-01-19 ENCOUNTER — Encounter: Payer: Self-pay | Admitting: Acute Care

## 2020-01-19 ENCOUNTER — Telehealth (INDEPENDENT_AMBULATORY_CARE_PROVIDER_SITE_OTHER): Payer: Managed Care, Other (non HMO) | Admitting: Acute Care

## 2020-01-19 ENCOUNTER — Inpatient Hospital Stay: Admission: RE | Admit: 2020-01-19 | Payer: 59 | Source: Ambulatory Visit

## 2020-01-19 DIAGNOSIS — F17210 Nicotine dependence, cigarettes, uncomplicated: Secondary | ICD-10-CM | POA: Diagnosis not present

## 2020-01-19 NOTE — Telephone Encounter (Signed)
Spoke to pt and advised that we will change her shared decision visit today to a virtual visit with a text link to connect.  Pt reports that her Covid test came back negative but her last exposure to the covid positive person was on 01/14/20.  I advised pt that we will reschedule her CT appt for 10 days after this exposure and she will get a call regarding this appt.  Pt verbalized understanding. Nothing further needed at this time.

## 2020-01-19 NOTE — Progress Notes (Signed)
Shared Decision Making Visit Lung Cancer Screening Program [phone])   Eligibility:  Age 59 y.o.  Pack Years Smoking History Calculation 34 pack year smoking history (# packs/per year x # years smoked)  Recent History of coughing up blood  no  Unexplained weight loss? no ( >Than 15 pounds within the last 6 months )  Prior History Lung / other cancer no (Diagnosis within the last 5 years already requiring surveillance chest CT Scans).  Smoking Status Current Smoker  Former Smokers: Years since quit: NA  Quit Date: NA  Visit Components:  Discussion included one or more decision making aids. yes  Discussion included risk/benefits of screening. yes  Discussion included potential follow up diagnostic testing for abnormal scans. yes  Discussion included meaning and risk of over diagnosis. yes  Discussion included meaning and risk of False Positives. yes  Discussion included meaning of total radiation exposure. yes  Counseling Included:  Importance of adherence to annual lung cancer LDCT screening. yes  Impact of comorbidities on ability to participate in the program. yes  Ability and willingness to under diagnostic treatment. yes  Smoking Cessation Counseling:  Current Smokers:   Discussed importance of smoking cessation. yes  Information about tobacco cessation classes and interventions provided to patient. yes  Patient provided with "ticket" for LDCT Scan. yes  Symptomatic Patient. no  Counseling  Diagnosis Code: Tobacco Use Z72.0  Asymptomatic Patient yes  Counseling (Intermediate counseling: > three minutes counseling) [id number]  Former Smokers:   Discussed the importance of maintaining cigarette abstinence. yes  Diagnosis Code: Personal History of Nicotine Dependence. [id number]  Information about tobacco cessation classes and interventions provided to patient. Yes  Patient provided with "ticket" for LDCT Scan. yes  Written Order for Lung Cancer  Screening with LDCT placed in Epic. Yes (CT Chest Lung Cancer Screening Low Dose W/O CM) [id number] Z12.2-Screening of respiratory organs Z87.891-Personal history of nicotine dependence  This visit was done as a virtual visit as patient had a COVID exposure 01/14/2020.  Her scan will be delayed until 2 weeks post exposure date , unless patient develop symptoms,and a positive COVID test,  in which case it will be delayed until patient is symptom free x 10 days after testing negative for COVID.   I have spent 25 minutes of face to face time with [name] discussing the risks and benefits of lung cancer screening. We viewed a power point together that explained in detail the above noted topics. We paused at intervals to allow for questions to be asked and answered to ensure understanding.We discussed that the single most powerful action that she can take to decrease her risk of developing lung cancer is to quit smoking. We discussed whether or not she is ready to commit to setting a quit date. We discussed options for tools to aid in quitting smoking including nicotine replacement therapy, non-nicotine medications, support groups, Quit Smart classes, and behavior modification. We discussed that often times setting smaller, more achievable goals, such as eliminating 1 cigarette a day for a week and then 2 cigarettes a day for a week can be helpful in slowly decreasing the number of cigarettes smoked. This allows for a sense of accomplishment as well as providing a clinical benefit. I gave her the " Be Stronger Than Your Excuses" card with contact information for community resources, classes, free nicotine replacement therapy, and access to mobile apps, text messaging, and on-line smoking cessation help. I have also given her my card and contact information in  the event she needs to contact me. We discussed the time and location of the scan, and that either Doroteo Glassman RN or I will call with the results within  24-48 hours of receiving them. I have offered her  a copy of the power point we viewed  as a resource in the event they need reinforcement of the concepts we discussed today in the office. The patient verbalized understanding of all of  the above and had no further questions upon leaving the office. They have my contact information in the event they have any further questions.  I spent 4 minutes counseling on smoking cessation and the health risks of continued tobacco abuse.  I explained to the patient that there has been a high incidence of coronary artery disease noted on these exams. I explained that this is a non-gated exam therefore degree or severity cannot be determined. This patient is not on statin therapy. I have asked the patient to follow-up with their PCP regarding any incidental finding of coronary artery disease and management with diet or medication as their PCP  feels is clinically indicated. The patient verbalized understanding of the above and had no further questions upon completion of the visit.   Scan will be scheduled in 10 days if she remains symptom free.  She had a negative COVID test 1/20 /2021   Lindsay Spatz, NP 01/19/2020 2:52 PM

## 2020-01-19 NOTE — Patient Instructions (Signed)

## 2020-01-19 NOTE — Telephone Encounter (Signed)
The SDMV  Can be virtual. We need to know date of exposure and she will need to wait 10 days after exposure before she can get the CT scan. Please determine date of exposure. Thanks

## 2020-01-24 ENCOUNTER — Other Ambulatory Visit (HOSPITAL_COMMUNITY): Payer: 59

## 2020-01-26 ENCOUNTER — Other Ambulatory Visit (HOSPITAL_COMMUNITY)
Admission: RE | Admit: 2020-01-26 | Discharge: 2020-01-26 | Disposition: A | Discharge status: Home / Self Care | Payer: Managed Care, Other (non HMO) | Source: Ambulatory Visit | Attending: Pulmonary Disease | Admitting: Pulmonary Disease

## 2020-01-26 DIAGNOSIS — Z20822 Contact with and (suspected) exposure to covid-19: Secondary | ICD-10-CM | POA: Diagnosis not present

## 2020-01-26 DIAGNOSIS — Z01812 Encounter for preprocedural laboratory examination: Secondary | ICD-10-CM | POA: Diagnosis present

## 2020-01-26 LAB — SARS CORONAVIRUS 2 (TAT 6-24 HRS): SARS Coronavirus 2: NEGATIVE

## 2020-01-28 ENCOUNTER — Ambulatory Visit (INDEPENDENT_AMBULATORY_CARE_PROVIDER_SITE_OTHER): Payer: Managed Care, Other (non HMO) | Admitting: Pulmonary Disease

## 2020-01-28 ENCOUNTER — Other Ambulatory Visit: Payer: Self-pay

## 2020-01-28 ENCOUNTER — Ambulatory Visit: Payer: 59 | Admitting: Primary Care

## 2020-01-28 ENCOUNTER — Ambulatory Visit: Payer: 59 | Admitting: Pulmonary Disease

## 2020-01-28 ENCOUNTER — Ambulatory Visit: Payer: Self-pay | Admitting: Pulmonary Disease

## 2020-01-28 DIAGNOSIS — J449 Chronic obstructive pulmonary disease, unspecified: Secondary | ICD-10-CM

## 2020-01-28 LAB — PULMONARY FUNCTION TEST
DL/VA % pred: 79 %
DL/VA: 3.37 ml/min/mmHg/L
DLCO unc % pred: 76 %
DLCO unc: 15.22 ml/min/mmHg
FEF 25-75 Post: 1.08 L/sec
FEF 25-75 Pre: 0.88 L/sec
FEF2575-%Change-Post: 23 %
FEF2575-%Pred-Post: 46 %
FEF2575-%Pred-Pre: 37 %
FEV1-%Change-Post: 5 %
FEV1-%Pred-Post: 64 %
FEV1-%Pred-Pre: 60 %
FEV1-Post: 1.62 L
FEV1-Pre: 1.53 L
FEV1FVC-%Change-Post: -3 %
FEV1FVC-%Pred-Pre: 86 %
FEV6-%Change-Post: 9 %
FEV6-%Pred-Post: 78 %
FEV6-%Pred-Pre: 71 %
FEV6-Post: 2.45 L
FEV6-Pre: 2.24 L
FEV6FVC-%Change-Post: 0 %
FEV6FVC-%Pred-Post: 101 %
FEV6FVC-%Pred-Pre: 102 %
FVC-%Change-Post: 9 %
FVC-%Pred-Post: 77 %
FVC-%Pred-Pre: 70 %
FVC-Post: 2.5 L
FVC-Pre: 2.27 L
Post FEV1/FVC ratio: 65 %
Post FEV6/FVC ratio: 98 %
Pre FEV1/FVC ratio: 67 %
Pre FEV6/FVC Ratio: 99 %
RV % pred: 142 %
RV: 2.79 L
TLC % pred: 108 %
TLC: 5.42 L

## 2020-01-28 NOTE — Progress Notes (Signed)
PFT done today. 

## 2020-02-04 ENCOUNTER — Ambulatory Visit (INDEPENDENT_AMBULATORY_CARE_PROVIDER_SITE_OTHER)
Admission: RE | Admit: 2020-02-04 | Discharge: 2020-02-04 | Disposition: A | Discharge status: Home / Self Care | Payer: Managed Care, Other (non HMO) | Source: Ambulatory Visit | Attending: Acute Care | Admitting: Acute Care

## 2020-02-04 ENCOUNTER — Other Ambulatory Visit: Payer: Self-pay

## 2020-02-04 DIAGNOSIS — Z87891 Personal history of nicotine dependence: Secondary | ICD-10-CM

## 2020-02-04 DIAGNOSIS — F17210 Nicotine dependence, cigarettes, uncomplicated: Secondary | ICD-10-CM

## 2020-02-04 NOTE — Progress Notes (Signed)
LMTCB

## 2020-02-05 ENCOUNTER — Telehealth: Payer: Self-pay | Admitting: Pulmonary Disease

## 2020-02-05 NOTE — Telephone Encounter (Signed)
Received call from [place] at [facility] with call report on lung CA screening CT from 02/04/2020  Impression:   IMPRESSION: 1. Lung-RADS 4A, suspicious. Follow up low-dose chest CT without contrast in 3 months (please use the following order, "CT CHEST LCS NODULE FOLLOW-UP W/O CM") is recommended. Alternatively, PET may be considered when there is a solid component 52mm or larger. 2. Aortic Atherosclerosis (ICD10-I70.0) and Emphysema (ICD10-J43.9).  These results will be called to the ordering clinician or representative by the Radiologist Assistant, and communication documented in the PACS or zVision Dashboard.   Electronically Signed   By: Ilona Sorrel M.D.   On: 02/05/2020 08:13  Forwarding to Judson Roch to be made aware, marked as urgent

## 2020-02-05 NOTE — Telephone Encounter (Signed)
SG please review.

## 2020-02-06 NOTE — Progress Notes (Signed)
LMTCB

## 2020-02-09 ENCOUNTER — Ambulatory Visit: Payer: Managed Care, Other (non HMO) | Admitting: Acute Care

## 2020-02-09 ENCOUNTER — Encounter: Payer: Self-pay | Admitting: Acute Care

## 2020-02-09 ENCOUNTER — Other Ambulatory Visit: Payer: Self-pay

## 2020-02-09 ENCOUNTER — Other Ambulatory Visit: Payer: Self-pay | Admitting: Acute Care

## 2020-02-09 VITALS — BP 126/84 | HR 99 | Temp 97.2°F | Ht 64.5 in | Wt 147.8 lb

## 2020-02-09 DIAGNOSIS — Z23 Encounter for immunization: Secondary | ICD-10-CM

## 2020-02-09 DIAGNOSIS — J449 Chronic obstructive pulmonary disease, unspecified: Secondary | ICD-10-CM | POA: Diagnosis not present

## 2020-02-09 DIAGNOSIS — R9389 Abnormal findings on diagnostic imaging of other specified body structures: Secondary | ICD-10-CM | POA: Diagnosis not present

## 2020-02-09 DIAGNOSIS — F17210 Nicotine dependence, cigarettes, uncomplicated: Secondary | ICD-10-CM | POA: Diagnosis not present

## 2020-02-09 DIAGNOSIS — Z720 Tobacco use: Secondary | ICD-10-CM

## 2020-02-09 DIAGNOSIS — Z0000 Encounter for general adult medical examination without abnormal findings: Secondary | ICD-10-CM | POA: Insufficient documentation

## 2020-02-09 MED ORDER — AZITHROMYCIN 250 MG PO TABS: ORAL_TABLET | ORAL | 0 refills | Status: DC

## 2020-02-09 MED ORDER — FLUTTER DEVI: 0 refills | Status: DC

## 2020-02-09 MED ORDER — SPIRIVA RESPIMAT 1.25 MCG/ACT IN AERS: 2.0000 | INHALATION_SPRAY | Freq: Every day | RESPIRATORY_TRACT | 5 refills | Status: DC

## 2020-02-09 NOTE — Telephone Encounter (Signed)
This patient has an appointment with me today. Thanks so much

## 2020-02-09 NOTE — Assessment & Plan Note (Signed)
Flu shot today 

## 2020-02-09 NOTE — Progress Notes (Addendum)
History of Present Illness [name] is a 59 y.o. female current every day smokier with COPD and chronic bronchitis. She is followed by Dr. Elsworth Soho.    02/09/2020  Follow up PFT's and LDCT Pt. Presents for follow up. She was last seen for a virtual Shared Decision Making Visit  on 01/19/2020. She presents for follow up of PFT's . She states she has not been taking her Symbicort x 1 week. She is almost out of her Spiriva. She does have her Ventolin. She states she uses her rescue 2-3 times daily.She states she cannot afford the Symbicort she is on.She continues to smoke. She has stopped her Wellbutrin, which was prescribed to assist with smoking cessation.  She states this made her dizzy.  She states she is coughing up secretions. She states they are clear. She does not have fever. She has exertional dyspnea. She does not want a pred taper. Upon auscultation she has rhonchi bilaterally , I suspect early flare. We discussed the importance of using maintenance inhaler every day without fail.. Additionally we discussed that she needs to really work on quitting smoking as the results of smoking are now impacting her ability to complete her acts of daily living.   We discussed that her LDCT scan was abnormal. We discussed that it was read as a Lung RADS 4 A : suspicious findings, either short term follow up in 3 months or alternatively  PET Scan evaluation may be considered when there is a solid component of  8 mm or larger. We will schedule  a follow up scan in 3 months. She verbalized understanding and and is in agreement with a 3 month follow up.   Test Results: 02/04/2020: Low Dose CT Chest Lungs/Pleura: No pneumothorax. No pleural effusion. Mild centrilobular emphysema with mild diffuse bronchial wall thickening. No acute consolidative airspace disease or lung masses. There are several solid and non solid pulmonary nodules scattered in both lungs, largest in the peripheral right upper lobe measuring  8.3 mm in volume derived mean diameter (series 3/image 97).  Lung-RADS 4A, suspicious. Follow up low-dose chest CT without contrast in 3 months (please use the following order, "CT CHEST LCS NODULE FOLLOW-UP W/O CM") is recommended. Alternatively, PET may be considered when there is a solid component 2mm or larger. Aortic Atherosclerosis (ICD10-I70.0) and Emphysema (ICD10-J43.9).  I have reviewed this scan with Dr. Erskine Emery whi isin agreement with a 3 month follow up scan.   PFT's 01/28/2020 Moderate Obstruction, Normal DLCO , no significant BD response     CBC Latest Ref Rng & Units 05/25/2010 05/23/2009 05/21/2008  WBC 4.0 - 10.5 K/uL 7.9 10.3 7.9  Hemoglobin 12.0 - 15.0 g/dL 14.0 12.9 13.0  Hematocrit 36.0 - 46.0 % 40.0 37.3 37.7  Platelets 150 - 400 K/uL 269 284 370    BMP Latest Ref Rng & Units 05/23/2009 05/21/2008  Glucose 70 - 99 mg/dL 72 84  BUN 6 - 23 mg/dL 14 6  Creatinine 0.4 - 1.2 mg/dL 0.72 0.64  Sodium 135 - 145 mEq/L 138 137  Potassium 3.5 - 5.1 mEq/L 3.0(L) 3.1(L)  Chloride 96 - 112 mEq/L 107 108  CO2 19 - 32 mEq/L 22 22  Calcium 8.4 - 10.5 mg/dL 8.9 8.0(L)    BNP No results found for: BNP  ProBNP No results found for: PROBNP  PFT    Component Value Date/Time   FEV1PRE 1.53 01/28/2020 0913   FEV1POST 1.62 01/28/2020 0913   FVCPRE 2.27 01/28/2020  0913   FVCPOST 2.50 01/28/2020 0913   TLC 5.42 01/28/2020 0913   DLCOUNC 15.22 01/28/2020 0913   PREFEV1FVCRT 67 01/28/2020 0913   PSTFEV1FVCRT 65 01/28/2020 0913    CT CHEST LUNG CA SCREEN LOW DOSE W/O CM  Result Date: 02/05/2020 CLINICAL DATA:  59 year old asymptomatic female current smoker with 34 pack-year smoking history. EXAM: CT CHEST WITHOUT CONTRAST LOW-DOSE FOR LUNG CANCER SCREENING TECHNIQUE: Multidetector CT imaging of the chest was performed following the standard protocol without IV contrast. COMPARISON:  None. FINDINGS: Cardiovascular: Normal heart size. No significant pericardial  effusion/thickening. Mildly atherosclerotic nonaneurysmal thoracic aorta. Normal caliber pulmonary arteries. Mediastinum/Nodes: No discrete thyroid nodules. Unremarkable esophagus. No pathologically enlarged axillary, mediastinal or hilar lymph nodes, noting limited sensitivity for the detection of hilar adenopathy on this noncontrast study. Lungs/Pleura: No pneumothorax. No pleural effusion. Mild centrilobular emphysema with mild diffuse bronchial wall thickening. No acute consolidative airspace disease or lung masses. There are several solid and non solid pulmonary nodules scattered in both lungs, largest in the peripheral right upper lobe measuring 8.3 mm in volume derived mean diameter (series 3/image 97). Upper abdomen: No acute abnormality. Musculoskeletal: No aggressive appearing focal osseous lesions. Partially visualized surgical hardware from ACDF. Mild thoracic spondylosis. IMPRESSION: 1. Lung-RADS 4A, suspicious. Follow up low-dose chest CT without contrast in 3 months (please use the following order, "CT CHEST LCS NODULE FOLLOW-UP W/O CM") is recommended. Alternatively, PET may be considered when there is a solid component 59mm or larger. 2. Aortic Atherosclerosis (ICD10-I70.0) and Emphysema (ICD10-J43.9). These results will be called to the ordering clinician or representative by the Radiologist Assistant, and communication documented in the PACS or zVision Dashboard. Electronically Signed   By: Ilona Sorrel M.D.   On: 02/05/2020 08:13     Past medical hx Past Medical History:  Diagnosis Date  . Anxiety   . Arthritis   . Cancer ([place])   . Cancer of cervix ([place])   . Cervical spondylosis without myelopathy 08/04/2013  . COPD (chronic obstructive pulmonary disease) ([place])   . Depression   . Fibromyalgia   . Migraine without aura, with intractable migraine, so stated, without mention of status migrainosus 03/13/2013  . Osteoporosis      Social History   Tobacco Use  . Smoking status:  Current Every Day Smoker    Packs/day: 0.75    Years: 46.00    Pack years: 34.50    Types: Cigarettes  . Smokeless tobacco: Never Used  Substance Use Topics  . Alcohol use: No  . Drug use: No    Ms.Resurreccion reports that she has been smoking cigarettes. She has a 34.50 pack-year smoking history. She has never used smokeless tobacco. She reports that she does not drink alcohol or use drugs.  Tobacco Cessation: Current Every Day Smoker with a 34.5 pack year smoking history  Past surgical hx, Family hx, Social hx all reviewed.  Current Outpatient Medications on File Prior to Visit  Medication Sig  . albuterol (PROVENTIL) (5 MG/ML) 0.5% nebulizer solution Take 2.5 mg by nebulization every 6 (six) hours as needed.  . budesonide-formoterol (SYMBICORT) 160-4.5 MCG/ACT inhaler Inhale 2 puffs into the lungs 2 (two) times daily.  . diclofenac (VOLTAREN) 75 MG EC tablet Take 75 mg by mouth 2 (two) times daily.  Eduard Roux (AIMOVIG, 140 MG DOSE, [place]) Inject into the skin.  Marland Kitchen HYDROmorphone (DILAUDID) 4 MG tablet Take by mouth every 4 (four) hours as needed for severe pain.  . hydrOXYzine (VISTARIL) 50 MG capsule  Take 50 mg by mouth daily.  Marland Kitchen lamoTRIgine (LAMICTAL) 100 MG tablet Take 100 mg by mouth daily.  . Magnesium Oxide -Mg Supplement 250 MG TABS Take 250 mg by mouth daily.  . meloxicam (MOBIC) 15 MG tablet Take 1 tablet (15 mg total) by mouth daily.  . nortriptyline (PAMELOR) 75 MG capsule Take 75 mg by mouth at bedtime.  Marland Kitchen omeprazole (PRILOSEC) 20 MG capsule Take 20 mg by mouth daily.  . ondansetron (ZOFRAN) 4 MG tablet Take 1 tablet (4 mg total) by mouth every 8 (eight) hours as needed for nausea.  . promethazine (PHENERGAN) 25 MG tablet Take 25 mg by mouth every 6 (six) hours as needed for nausea or vomiting.  . Riboflavin 400 MG TABS Take 400 mg by mouth daily.  . SUMAtriptan (IMITREX) 100 MG tablet TAKE 1 TABLET (100 MG TOTAL) BY MOUTH AS NEEDED FOR MIGRAINE.  Marland Kitchen SUMAtriptan (IMITREX) 6  MG/0.5ML SOLN injection Inject 0.5 mLs (6 mg total) into the skin as needed for migraine or headache. May repeat in 2 hours if headache persists or recurs.  Marland Kitchen tiZANidine (ZANAFLEX) 4 MG tablet Take 1 tablet (4 mg total) by mouth 3 (three) times daily.  . vitamin C (ASCORBIC ACID) 500 MG tablet Take 500 mg by mouth daily.  Marland Kitchen zonisamide (ZONEGRAN) 100 MG capsule TAKE 2 CAPSULES (200 MG TOTAL) BY MOUTH DAILY.  Marland Kitchen buPROPion (WELLBUTRIN XL) 150 MG 24 hr tablet Take 1 tablet (150 mg total) by mouth 2 (two) times daily.   No current facility-administered medications on file prior to visit.     Allergies  Allergen Reactions  . Codeine Nausea Only  . Topamax [Topiramate] Hives  . Lyrica [Pregabalin] Swelling    Review Of Systems:  Constitutional:   No  weight loss, night sweats,  Fevers, chills, fatigue, or  lassitude.  HEENT:   No headaches,  Difficulty swallowing,  Tooth/dental problems, or  Sore throat,                No sneezing, itching, ear ache, nasal congestion, post nasal drip,   CV:  No chest pain,  Orthopnea, PND, swelling in lower extremities, anasarca, dizziness, palpitations, syncope.   GI  No heartburn, indigestion, abdominal pain, nausea, vomiting, diarrhea, change in bowel habits, loss of appetite, bloody stools.   Resp: + shortness of breath with exertion less at rest.  + baseline  excess mucus, + productive cough,  No non-productive cough,  No coughing up of blood.  No change in color of mucus.  Occasional  wheezing.  No chest wall deformity  Skin: no rash or lesions.  GU: no dysuria, change in color of urine, no urgency or frequency.  No flank pain, no hematuria   MS:  No joint pain or swelling.  No decreased range of motion.  No back pain.  Psych:  No change in mood or affect. No depression or anxiety.  No memory loss.   Vital Signs BP 126/84 (BP Location: Left Arm, Cuff Size: Normal)   Pulse 99   Temp (!) 97.2 F (36.2 C) (Temporal)   Ht 5' 4.5" (1.638 m)   Wt  147 lb 12.8 oz (67 kg)   SpO2 97%   BMI 24.98 kg/m    Physical Exam:  General- No distress,  A&Ox3, pleasant  ENT: No sinus tenderness, TM clear, pale nasal mucosa, no oral exudate,no post nasal drip, no LAN Cardiac: S1, S2, regular rate and rhythm, no murmur Chest: No wheeze/ coarse rhonchi  throughout, ; no accessory muscle use, no nasal flaring, no sternal retractions Abd.: Soft Non-tender, ND, BS +, Body mass index is 24.98 kg/m. Ext: No clubbing cyanosis, edema Neuro:  normal strength, MAE x 4, A&O x 3 Skin: No rashes, No lesions , warm and dry Psych: normal mood and behavior   Assessment/Plan  COPD with chronic bronchitis and emphysema (HCC) Suspect minor flare Continues to smoke Ran out of Symbicort x 1 week Plan We will send in a prescription today for your Spiriva. Call your insurance company to ensure you are on the preferred inhaler for your coverage.  Please work on quitting smoking. This is the single most powerful action you can take to decrease your risk of lung cancer and heart disease.  We will prescribe a z pack today. Use as prescribed. Start Mucinex 1200 mg once daily for mucus clearance.  Take with a full glass of water We will prescribe  a flutter valve Use twice daily, four puffs each time.  Watch U tube for instructions on use . Note your daily symptoms > remember "red flags" for COPD:  Increase in cough, increase in sputum production, increase in shortness of breath or activity intolerance. If you notice these symptoms, please call to be seen.  Flu shot today. ( regular dose)  Follow up in 3 months with Cj Beecher or Dr. Elsworth Soho Please contact office for sooner follow up if symptoms do not improve or worsen or seek emergency care     Tobacco abuse Counseled on quitting smoking Gave patient the Be Stronger than your excuses card.  Abnormal screening CT of chest Lung RADS 4 A : suspicious findings, either short term follow up in 3 months or  alternatively  PET Scan evaluation may be considered when there is a solid component of  8 mm or larger. Plan Scan reviewed with Dr. Erskine Emery We will schedule a follow up CT in 3 months   Healthcare maintenance Flu shot today  We have asked the patient to contact her insurance company to see which inhaler is preferred on her plan to replace the Symbicort that she can no longer afford. She is able to get her Spriiva. We will order the preferred medication to replace the Symbicort as soon as she lets [place] know what it is.   Magdalen Spatz, NP 02/09/2020  4:38 PM

## 2020-02-09 NOTE — Assessment & Plan Note (Signed)
Suspect minor flare Continues to smoke Ran out of Symbicort x 1 week Plan We will send in a prescription today for your Spiriva. Call your insurance company to ensure you are on the preferred inhaler for your coverage.  Please work on quitting smoking. This is the single most powerful action you can take to decrease your risk of lung cancer and heart disease.  We will prescribe a z pack today. Use as prescribed. Start Mucinex 1200 mg once daily for mucus clearance.  Take with a full glass of water We will prescribe  a flutter valve Use twice daily, four puffs each time.  Watch U tube for instructions on use . Note your daily symptoms > remember "red flags" for COPD:  Increase in cough, increase in sputum production, increase in shortness of breath or activity intolerance. If you notice these symptoms, please call to be seen.  Flu shot today. ( regular dose)  Follow up in 3 months with Lavelle Berland or Dr. Elsworth Soho Please contact office for sooner follow up if symptoms do not improve or worsen or seek emergency care

## 2020-02-09 NOTE — Patient Instructions (Addendum)
It is good to see you today.  We will send in a prescription today for your Spiriva. Call your insurance company to ensure you are on the preferred inhaler for your coverage.  Please work on quitting smoking. This is the single most powerful action you can take to decrease your risk of lung cancer and heart disease.  Tour CT scan was read as a Lung RADS 4 A : suspicious findings, either short term follow up in 3 months or alternatively  PET Scan evaluation may be considered when there is a solid component of  8 mm or larger. We will schedule you for a 3 month follow up scan. I will discuss your scan with one of the doctors, and if they feel we need to follow up sooner we will call you.   We will prescribe a z pack today. Use as prescribed. Start Mucinex 1200 mg once daily for mucus clearance.  Take with a full glass of water We will prescribe  a flutter valve Use twice daily, four puffs each time.  Watch U tube for instructions on use . Note your daily symptoms > remember "red flags" for COPD:  Increase in cough, increase in sputum production, increase in shortness of breath or activity intolerance. If you notice these symptoms, please call to be seen.  Flu shot today. ( regular dose)  Follow up in 3 months with Megha Agnes or Dr. Elsworth Soho Please contact office for sooner follow up if symptoms do not improve or worsen or seek emergency care

## 2020-02-09 NOTE — Assessment & Plan Note (Signed)
Lung RADS 4 A : suspicious findings, either short term follow up in 3 months or alternatively  PET Scan evaluation may be considered when there is a solid component of  8 mm or larger. Plan Scan reviewed with Dr. Erskine Emery We will schedule a follow up CT in 3 months

## 2020-02-09 NOTE — Assessment & Plan Note (Signed)
Counseled on quitting smoking Gave patient the Be Stronger than your excuses card.

## 2020-02-10 ENCOUNTER — Telehealth: Payer: Self-pay | Admitting: Acute Care

## 2020-02-10 NOTE — Telephone Encounter (Signed)
Which strength, 100 or 200?

## 2020-02-10 NOTE — Telephone Encounter (Signed)
Received a PA request from [facility] [address]. I attempted the PA but the form asked if the patient had tried or failed any of the alternatives. The only alternative listed on CoverMyMeds was Incruse.   When I checked the formulary online, the following medications are covered: [name], Incruse, Symbicort, Trelegy, Breztri, Serevent, Flovent, Advair HFA, Pulmicort Flexhaler, Flovent Diskus, Bevespi and [employer].   Per her chart, it looks like she has already tried Symbicort 121mcg.   [name], please advise if you would like for me to attempt the PA or if you would like to start her on one of the covered medications mentioned above. Thank you!

## 2020-02-10 NOTE — Telephone Encounter (Signed)
Try Memory Dance

## 2020-02-11 MED ORDER — BREO ELLIPTA 200-25 MCG/INH IN AEPB: 1.0000 | INHALATION_SPRAY | Freq: Every day | RESPIRATORY_TRACT | 5 refills | Status: DC

## 2020-02-11 NOTE — Telephone Encounter (Signed)
Rx has been sent in per Sarah. 

## 2020-02-11 NOTE — Telephone Encounter (Signed)
200. Thanks

## 2020-02-12 NOTE — Progress Notes (Signed)
Please place order for a 3 month follow up scan. I saw the patient in the office and gave her the results. Please fax a copy to the PCP also. Thanks so much

## 2020-02-16 ENCOUNTER — Other Ambulatory Visit: Payer: Self-pay | Admitting: Family Medicine

## 2020-02-16 ENCOUNTER — Other Ambulatory Visit: Payer: Self-pay | Admitting: *Deleted

## 2020-02-16 DIAGNOSIS — Z1231 Encounter for screening mammogram for malignant neoplasm of breast: Secondary | ICD-10-CM

## 2020-02-16 DIAGNOSIS — F17210 Nicotine dependence, cigarettes, uncomplicated: Secondary | ICD-10-CM

## 2020-02-24 ENCOUNTER — Other Ambulatory Visit: Payer: Self-pay

## 2020-02-24 ENCOUNTER — Telehealth: Payer: Self-pay | Admitting: Critical Care Medicine

## 2020-02-24 NOTE — Telephone Encounter (Signed)
PA request received from [facility]  Drug requested: Breo 200 CMM Key: [id number] Tried/failed: Spiriva 1.25, Symbicort Covered alternatives:  PA request has been sent to plan, and a determination is expected within 1-2 days.   Routing to [place] for follow-up. Dr. Elsworth Soho patient not Dr. Carlis Abbott per encounter

## 2020-02-25 NOTE — Telephone Encounter (Signed)
Pt. Is already on Spiriva. Incruse is the same medication class as Spiriva.Keenan Bachelor does not replace Breo. The Breo was to replace the Symbicort she is not able to get. Please have her call her insurance company to see what their preferred drug is for LABA / ICS . That is what she needs. Then we can prescribe that. Thanks

## 2020-02-25 NOTE — Telephone Encounter (Signed)
Attempted to call pt but unable to reach. Left message for pt to return call. 

## 2020-02-25 NOTE — Telephone Encounter (Signed)
Patient calling back to inform that her insurance will cover the Incruse inhaler. [facility] choice [facility] [address] [facility] [place]. Pt can be reached at [phone].

## 2020-02-25 NOTE — Telephone Encounter (Signed)
Message forwarded to app of the day, Eric Form, NP  Judson Roch,   This patient was last seen in clinic by you on 02/09/20. Dx: COPD with chronic bronchitis and emphysema, abnormal chest CT, current smoker.  She was given a prescription for Breo on 02/11/20. But not covered based the response from the pharmacy. PA was submitted but has not been approved yet.  However, the patient contacted the office to state insurance will cover Incruse. Will this be an appropriate alternative for the patient?

## 2020-03-02 ENCOUNTER — Telehealth: Payer: Self-pay | Admitting: Critical Care Medicine

## 2020-03-02 NOTE — Telephone Encounter (Signed)
Message from previous encounter 3/2 states: Nena Polio, CMA  Note   Message forwarded to app of the day, Eric Form, NP  Judson Roch,   This patient was last seen in clinic by you on 02/09/20. Dx: COPD with chronic bronchitis and emphysema, abnormal chest CT, current smoker.  She was given a prescription for Breo on 02/11/20. But not covered based the response from the pharmacy. PA was submitted but has not been approved yet.  However, the patient contacted the office to state insurance will cover Incruse. Will this be an appropriate alternative for the patient?      Magdalen Spatz, NP    2:56 P Note   Pt. Is already on Spiriva. Incruse is the same medication class as Spiriva.Keenan Bachelor does not replace Breo. The Breo was to replace the Symbicort she is not able to get. Please have her call her insurance company to see what their preferred drug is for LABA / ICS . That is what she needs. Then we can prescribe that. Thanks     Called and spoke with pt letting her know the info stated by Judson Roch. While speaking with pt, pt stated that she is not on Spiriva due to not being able to afford it. Pt also stated that she is on Symbicort not Breo due to Symbicort working well for her.  Dr. Carlis Abbott, please advise on all this for pt in regards to her inhalers.

## 2020-03-02 NOTE — Telephone Encounter (Signed)
Sure. Still need to solve what to replace Breo with.  LPC

## 2020-03-02 NOTE — Telephone Encounter (Signed)
Agree with Judson Roch. No Incruse if she is staying on Spiriva.   [place], Symbicort are her options.  LPC

## 2020-03-02 NOTE — Telephone Encounter (Signed)
Pt could not afford Spiriva. Are you okay if we change pt's Rx to Incruse?

## 2020-03-02 NOTE — Telephone Encounter (Signed)
Pt states she has symbicort and states that it has been working well for her

## 2020-03-05 MED ORDER — INCRUSE ELLIPTA 62.5 MCG/INH IN AEPB: 1.0000 | INHALATION_SPRAY | Freq: Every day | RESPIRATORY_TRACT | 5 refills | Status: DC

## 2020-03-05 NOTE — Telephone Encounter (Signed)
I have sent Rx for incruse to pharmacy for pt. Called and spoke with pt letting her know this had been done and she verbalized understanding. Nothing further needed.

## 2020-03-05 NOTE — Telephone Encounter (Signed)
Pt  Returning a phone call to Dr. Carlis Abbott regarding her inhalers. Pt can be reached at [phone].

## 2020-03-22 ENCOUNTER — Ambulatory Visit: Payer: Managed Care, Other (non HMO)

## 2020-04-07 ENCOUNTER — Telehealth: Payer: Self-pay | Admitting: Pulmonary Disease

## 2020-04-07 MED ORDER — DOXYCYCLINE HYCLATE 100 MG PO TABS: 100.0000 mg | ORAL_TABLET | Freq: Two times a day (BID) | ORAL | 0 refills | Status: DC

## 2020-04-07 NOTE — Telephone Encounter (Signed)
Yes I sent in Doxycycline

## 2020-04-07 NOTE — Telephone Encounter (Signed)
Spoke with pt, she is out of town and she thinks she has bronchitis again. She has a ST, severe cough, and congestion. It started a couple days ago. Denies fever but does feel hot and cold. She doesn't wear oxygen but she is SOB more than normal. She does admit to not using her inhalers as much. Her cough doesn't produce much sputum. Can we send in an ABX? I changed her pharmacy to a pharmacy in [place] per pt request. Please advise.

## 2020-04-07 NOTE — Telephone Encounter (Signed)
Called pt and advised message from the provider. Pt understood and verbalized understanding. Nothing further is needed.    

## 2020-04-21 ENCOUNTER — Ambulatory Visit: Payer: Managed Care, Other (non HMO)

## 2020-04-26 ENCOUNTER — Other Ambulatory Visit: Payer: Self-pay | Admitting: Orthopaedic Surgery

## 2020-05-05 NOTE — Patient Instructions (Signed)
DUE TO COVID-19 ONLY ONE VISITOR IS ALLOWED TO COME WITH YOU AND STAY IN THE WAITING ROOM ONLY DURING PRE OP AND PROCEDURE DAY OF SURGERY. THE 1 VISITOR MAY VISIT WITH YOU AFTER SURGERY IN YOUR PRIVATE ROOM DURING VISITING HOURS ONLY!  YOU NEED TO HAVE A COVID 19 TEST ON: 05/14/20 @      , THIS TEST MUST BE DONE BEFORE SURGERY, COME  [place], [place] , [address].  ([place]) ONCE YOUR COVID TEST IS COMPLETED, PLEASE BEGIN THE QUARANTINE INSTRUCTIONS AS OUTLINED IN YOUR HANDOUT.                MODENE CLOER    Your procedure is scheduled on: 05/18/20   Report to [facility] Main  Entrance   Report to short stay at: 5:30 AM     Call this number if you have problems the morning of surgery [phone]    Remember:   NO SOLID FOOD AFTER MIDNIGHT THE NIGHT PRIOR TO SURGERY. NOTHING BY MOUTH EXCEPT CLEAR LIQUIDS UNTIL: 4:30 am . PLEASE FINISH ENSURE DRINK PER SURGEON ORDER  WHICH NEEDS TO BE COMPLETED AT : 4:30 am.   CLEAR LIQUID DIET   Foods Allowed                                                                     Foods Excluded  Coffee and tea, regular and decaf                             liquids that you cannot  Plain Jell-O any favor except red or purple                                           see through such as: Fruit ices (not with fruit pulp)                                     milk, soups, orange juice  Iced Popsicles                                    All solid food Carbonated beverages, regular and diet                                    Cranberry, grape and apple juices Sports drinks like Gatorade Lightly seasoned clear broth or consume(fat free) Sugar, honey syrup  Sample Menu Breakfast                                Lunch                                     Supper Cranberry juice  Beef broth                            Chicken broth Jell-O                                     Grape juice                            Apple juice Coffee or tea                        Jell-O                                      Popsicle                                                Coffee or tea                        Coffee or tea  _____________________________________________________________________   BRUSH YOUR TEETH MORNING OF SURGERY AND RINSE YOUR MOUTH OUT, NO CHEWING GUM CANDY OR MINTS.     Take these medicines the morning of surgery with A SIP OF WATER: pantoprazole,sumitriptan as needed.Use inhalers as usual.                                 You may not have any metal on your body including hair pins and              piercings  Do not wear jewelry, make-up, lotions, powders or perfumes, deodorant             Do not wear nail polish on your fingernails.  Do not shave  48 hours prior to surgery.             Do not bring valuables to the hospital. [place].  Contacts, dentures or bridgework may not be worn into surgery.  Leave suitcase in the car. After surgery it may be brought to your room.     Patients discharged the day of surgery will not be allowed to drive home. IF YOU ARE HAVING SURGERY AND GOING HOME THE SAME DAY, YOU MUST HAVE AN ADULT TO DRIVE YOU HOME AND BE WITH YOU FOR 24 HOURS. YOU MAY GO HOME BY TAXI OR UBER OR ORTHERWISE, BUT AN ADULT MUST ACCOMPANY YOU HOME AND STAY WITH YOU FOR 24 HOURS.  Name and phone number of your driver:  Special Instructions: N/A              Please read over the following fact sheets you were given: _____________________________________________________________________             [facility] - Preparing for Surgery Before surgery, you can play an important role.  Because skin is not sterile, your skin needs to be as free of germs as possible.  You can reduce  the number of germs on your skin by washing with CHG (chlorahexidine gluconate) soap before surgery.  CHG is an antiseptic cleaner which kills germs and bonds  with the skin to continue killing germs even after washing. Please DO NOT use if you have an allergy to CHG or antibacterial soaps.  If your skin becomes reddened/irritated stop using the CHG and inform your nurse when you arrive at Short Stay. Do not shave (including legs and underarms) for at least 48 hours prior to the first CHG shower.  You may shave your face/neck. Please follow these instructions carefully:  1.  Shower with CHG Soap the night before surgery and the  morning of Surgery.  2.  If you choose to wash your hair, wash your hair first as usual with your  normal  shampoo.  3.  After you shampoo, rinse your hair and body thoroughly to remove the  shampoo.                           4.  Use CHG as you would any other liquid soap.  You can apply chg directly  to the skin and wash                       Gently with a scrungie or clean washcloth.  5.  Apply the CHG Soap to your body ONLY FROM THE NECK DOWN.   Do not use on face/ open                           Wound or open sores. Avoid contact with eyes, ears mouth and genitals (private parts).                       Wash face,  Genitals (private parts) with your normal soap.             6.  Wash thoroughly, paying special attention to the area where your surgery  will be performed.  7.  Thoroughly rinse your body with warm water from the neck down.  8.  DO NOT shower/wash with your normal soap after using and rinsing off  the CHG Soap.                9.  Pat yourself dry with a clean towel.            10.  Wear clean pajamas.            11.  Place clean sheets on your bed the night of your first shower and do not  sleep with pets. Day of Surgery : Do not apply any lotions/deodorants the morning of surgery.  Please wear clean clothes to the hospital/surgery center.  FAILURE TO FOLLOW THESE INSTRUCTIONS MAY RESULT IN THE CANCELLATION OF YOUR SURGERY PATIENT SIGNATURE_________________________________  NURSE  SIGNATURE__________________________________  ________________________________________________________________________   [name]  An incentive spirometer is a tool that can help keep your lungs clear and active. This tool measures how well you are filling your lungs with each breath. Taking long deep breaths may help reverse or decrease the chance of developing breathing (pulmonary) problems (especially infection) following:  A long period of time when you are unable to move or be active. BEFORE THE PROCEDURE   If the spirometer includes an indicator to show your best effort, your nurse or respiratory therapist will set it to  a desired goal.  If possible, sit up straight or lean slightly forward. Try not to slouch.  Hold the incentive spirometer in an upright position. INSTRUCTIONS FOR USE  1. Sit on the edge of your bed if possible, or sit up as far as you can in bed or on a chair. 2. Hold the incentive spirometer in an upright position. 3. Breathe out normally. 4. Place the mouthpiece in your mouth and seal your lips tightly around it. 5. Breathe in slowly and as deeply as possible, raising the piston or the ball toward the top of the column. 6. Hold your breath for 3-5 seconds or for as long as possible. Allow the piston or ball to fall to the bottom of the column. 7. Remove the mouthpiece from your mouth and breathe out normally. 8. Rest for a few seconds and repeat Steps 1 through 7 at least 10 times every 1-2 hours when you are awake. Take your time and take a few normal breaths between deep breaths. 9. The spirometer may include an indicator to show your best effort. Use the indicator as a goal to work toward during each repetition. 10. After each set of 10 deep breaths, practice coughing to be sure your lungs are clear. If you have an incision (the cut made at the time of surgery), support your incision when coughing by placing a pillow or rolled up towels firmly  against it. Once you are able to get out of bed, walk around indoors and cough well. You may stop using the incentive spirometer when instructed by your caregiver.  RISKS AND COMPLICATIONS  Take your time so you do not get dizzy or light-headed.  If you are in pain, you may need to take or ask for pain medication before doing incentive spirometry. It is harder to take a deep breath if you are having pain. AFTER USE  Rest and breathe slowly and easily.  It can be helpful to keep track of a log of your progress. Your caregiver can provide you with a simple table to help with this. If you are using the spirometer at home, follow these instructions: [place] IF:   You are having difficultly using the spirometer.  You have trouble using the spirometer as often as instructed.  Your pain medication is not giving enough relief while using the spirometer.  You develop fever of 100.5 F (38.1 C) or higher. SEEK IMMEDIATE MEDICAL CARE IF:   You cough up bloody sputum that had not been present before.  You develop fever of 102 F (38.9 C) or greater.  You develop worsening pain at or near the incision site. MAKE SURE YOU:   Understand these instructions.  Will watch your condition.  Will get help right away if you are not doing well or get worse. Document Released: 04/23/2007 Document Revised: 03/04/2012 Document Reviewed: 06/24/2007 [facility] Patient Information 2014 [place], [place].   ________________________________________________________________________

## 2020-05-06 ENCOUNTER — Encounter (HOSPITAL_COMMUNITY)
Admission: RE | Admit: 2020-05-06 | Discharge: 2020-05-06 | Disposition: A | Discharge status: Home / Self Care | Payer: Managed Care, Other (non HMO) | Source: Ambulatory Visit | Attending: Orthopaedic Surgery | Admitting: Orthopaedic Surgery

## 2020-05-06 NOTE — Progress Notes (Signed)
Several attempts to call pt. Were done since 1:00 pm. Pt. did not answer her phone.The emergency contact did not answer neither.

## 2020-05-07 ENCOUNTER — Telehealth: Payer: Self-pay | Admitting: Pulmonary Disease

## 2020-05-07 NOTE — Telephone Encounter (Signed)
Checked in Dr. Bari Mantis papers and did not see a surgical clearance form.  Number listed for [place] to call is the fax number. Called [facility] at [phone] to speak with Wells Guiles but unable to reach. Left her a detailed message to have the surgical clearance form refaxed in Dr. Bari Mantis attn.

## 2020-05-10 ENCOUNTER — Encounter (HOSPITAL_COMMUNITY): Payer: Self-pay

## 2020-05-10 ENCOUNTER — Other Ambulatory Visit: Payer: Self-pay

## 2020-05-10 NOTE — Patient Instructions (Signed)
DUE TO COVID-19 ONLY ONE VISITOR IS ALLOWED TO COME WITH YOU AND STAY IN THE WAITING ROOM ONLY DURING PRE OP AND PROCEDURE DAY OF SURGERY. THE 1 VISITOR MAY VISIT WITH YOU AFTER SURGERY IN YOUR PRIVATE ROOM DURING VISITING HOURS ONLY!  YOU NEED TO HAVE A COVID 19 TEST ON: 05/14/20 @ 1:00 pm, THIS TEST MUST BE DONE BEFORE SURGERY, COME  [place], [place] , [address].  ([place]) ONCE YOUR COVID TEST IS COMPLETED, PLEASE BEGIN THE QUARANTINE INSTRUCTIONS AS OUTLINED IN YOUR HANDOUT.                [name]     Your procedure is scheduled on: 05/18/20   Report to [facility] Main  Entrance   Report to short stay at: 5:30 AM     Call this number if you have problems the morning of surgery [phone]    Remember:   NO SOLID FOOD AFTER MIDNIGHT THE NIGHT PRIOR TO SURGERY. NOTHING BY MOUTH EXCEPT CLEAR LIQUIDS UNTIL: 4:30 am . PLEASE FINISH ENSURE DRINK PER SURGEON ORDER  WHICH NEEDS TO BE COMPLETED AT: 4:30 am .   CLEAR LIQUID DIET   Foods Allowed                                                                     Foods Excluded  Coffee and tea, regular and decaf                             liquids that you cannot  Plain Jell-O any favor except red or purple                                           see through such as: Fruit ices (not with fruit pulp)                                     milk, soups, orange juice  Iced Popsicles                                    All solid food Carbonated beverages, regular and diet                                    Cranberry, grape and apple juices Sports drinks like Gatorade Lightly seasoned clear broth or consume(fat free) Sugar, honey syrup  Sample Menu Breakfast                                Lunch                                     Supper Cranberry juice  Beef broth                            Chicken broth Jell-O                                     Grape juice                            Apple juice Coffee or tea                        Jell-O                                      Popsicle                                                Coffee or tea                        Coffee or tea  _____________________________________________________________________   BRUSH YOUR TEETH MORNING OF SURGERY AND RINSE YOUR MOUTH OUT, NO CHEWING GUM CANDY OR MINTS.     Take these medicines the morning of surgery with A SIP OF WATER: pantoprazole.Sumatriptan as needed.Use inhalers as usual.                                 You may not have any metal on your body including hair pins and              piercings  Do not wear jewelry, make-up, lotions, powders or perfumes, deodorant             Do not wear nail polish on your fingernails.  Do not shave  48 hours prior to surgery.            Do not bring valuables to the hospital. [place].  Contacts, dentures or bridgework may not be worn into surgery.  Leave suitcase in the car. After surgery it may be brought to your room.     Patients discharged the day of surgery will not be allowed to drive home. IF YOU ARE HAVING SURGERY AND GOING HOME THE SAME DAY, YOU MUST HAVE AN ADULT TO DRIVE YOU HOME AND BE WITH YOU FOR 24 HOURS. YOU MAY GO HOME BY TAXI OR UBER OR ORTHERWISE, BUT AN ADULT MUST ACCOMPANY YOU HOME AND STAY WITH YOU FOR 24 HOURS.  Name and phone number of your driver:  Special Instructions: N/A              Please read over the following fact sheets you were given: _____________________________________________________________________             [facility] - Preparing for Surgery Before surgery, you can play an important role.  Because skin is not sterile, your skin needs to be as free of germs as possible.  You can reduce the  number of germs on your skin by washing with CHG (chlorahexidine gluconate) soap before surgery.  CHG is an antiseptic cleaner which kills germs and  bonds with the skin to continue killing germs even after washing. Please DO NOT use if you have an allergy to CHG or antibacterial soaps.  If your skin becomes reddened/irritated stop using the CHG and inform your nurse when you arrive at Short Stay. Do not shave (including legs and underarms) for at least 48 hours prior to the first CHG shower.  You may shave your face/neck. Please follow these instructions carefully:  1.  Shower with CHG Soap the night before surgery and the  morning of Surgery.  2.  If you choose to wash your hair, wash your hair first as usual with your  normal  shampoo.  3.  After you shampoo, rinse your hair and body thoroughly to remove the  shampoo.                           4.  Use CHG as you would any other liquid soap.  You can apply chg directly  to the skin and wash                       Gently with a scrungie or clean washcloth.  5.  Apply the CHG Soap to your body ONLY FROM THE NECK DOWN.   Do not use on face/ open                           Wound or open sores. Avoid contact with eyes, ears mouth and genitals (private parts).                       Wash face,  Genitals (private parts) with your normal soap.             6.  Wash thoroughly, paying special attention to the area where your surgery  will be performed.  7.  Thoroughly rinse your body with warm water from the neck down.  8.  DO NOT shower/wash with your normal soap after using and rinsing off  the CHG Soap.                9.  Pat yourself dry with a clean towel.            10.  Wear clean pajamas.            11.  Place clean sheets on your bed the night of your first shower and do not  sleep with pets. Day of Surgery : Do not apply any lotions/deodorants the morning of surgery.  Please wear clean clothes to the hospital/surgery center.  FAILURE TO FOLLOW THESE INSTRUCTIONS MAY RESULT IN THE CANCELLATION OF YOUR SURGERY PATIENT SIGNATURE_________________________________  NURSE  SIGNATURE__________________________________  ________________________________________________________________________   [name]  An incentive spirometer is a tool that can help keep your lungs clear and active. This tool measures how well you are filling your lungs with each breath. Taking long deep breaths may help reverse or decrease the chance of developing breathing (pulmonary) problems (especially infection) following:  A long period of time when you are unable to move or be active. BEFORE THE PROCEDURE   If the spirometer includes an indicator to show your best effort, your nurse or respiratory therapist will set it to a  desired goal.  If possible, sit up straight or lean slightly forward. Try not to slouch.  Hold the incentive spirometer in an upright position. INSTRUCTIONS FOR USE  1. Sit on the edge of your bed if possible, or sit up as far as you can in bed or on a chair. 2. Hold the incentive spirometer in an upright position. 3. Breathe out normally. 4. Place the mouthpiece in your mouth and seal your lips tightly around it. 5. Breathe in slowly and as deeply as possible, raising the piston or the ball toward the top of the column. 6. Hold your breath for 3-5 seconds or for as long as possible. Allow the piston or ball to fall to the bottom of the column. 7. Remove the mouthpiece from your mouth and breathe out normally. 8. Rest for a few seconds and repeat Steps 1 through 7 at least 10 times every 1-2 hours when you are awake. Take your time and take a few normal breaths between deep breaths. 9. The spirometer may include an indicator to show your best effort. Use the indicator as a goal to work toward during each repetition. 10. After each set of 10 deep breaths, practice coughing to be sure your lungs are clear. If you have an incision (the cut made at the time of surgery), support your incision when coughing by placing a pillow or rolled up towels firmly  against it. Once you are able to get out of bed, walk around indoors and cough well. You may stop using the incentive spirometer when instructed by your caregiver.  RISKS AND COMPLICATIONS  Take your time so you do not get dizzy or light-headed.  If you are in pain, you may need to take or ask for pain medication before doing incentive spirometry. It is harder to take a deep breath if you are having pain. AFTER USE  Rest and breathe slowly and easily.  It can be helpful to keep track of a log of your progress. Your caregiver can provide you with a simple table to help with this. If you are using the spirometer at home, follow these instructions: [place] IF:   You are having difficultly using the spirometer.  You have trouble using the spirometer as often as instructed.  Your pain medication is not giving enough relief while using the spirometer.  You develop fever of 100.5 F (38.1 C) or higher. SEEK IMMEDIATE MEDICAL CARE IF:   You cough up bloody sputum that had not been present before.  You develop fever of 102 F (38.9 C) or greater.  You develop worsening pain at or near the incision site. MAKE SURE YOU:   Understand these instructions.  Will watch your condition.  Will get help right away if you are not doing well or get worse. Document Released: 04/23/2007 Document Revised: 03/04/2012 Document Reviewed: 06/24/2007 [facility] Patient Information 2014 [place], [place].   ________________________________________________________________________

## 2020-05-10 NOTE — Progress Notes (Signed)
PCP - Dr. Claris Gower. LOV: 02/09/20 Cardiologist -   Chest x-ray -  EKG -  Stress Test -  ECHO -  Cardiac Cath -   Sleep Study -  CPAP -   Fasting Blood Sugar -  Checks Blood Sugar _____ times a day  Blood Thinner Instructions: Aspirin Instructions: Last Dose:  Anesthesia review: Pt. Was advised by RN to avoid smoking before surgery.Pt. verbalized her understanding on this matter.  Patient denies shortness of breath, fever, cough and chest pain at PAT appointment   Patient verbalized understanding of instructions that were given to them at the PAT appointment. Patient was also instructed that they will need to review over the PAT instructions again at home before surgery.

## 2020-05-11 ENCOUNTER — Encounter (HOSPITAL_COMMUNITY)
Admission: RE | Admit: 2020-05-11 | Discharge: 2020-05-11 | Disposition: A | Discharge status: Home / Self Care | Payer: Managed Care, Other (non HMO) | Source: Ambulatory Visit | Attending: Family Medicine | Admitting: Family Medicine

## 2020-05-11 HISTORY — DX: Dyspnea, unspecified: R06.00

## 2020-05-11 NOTE — Telephone Encounter (Signed)
I have surgical clearance form. I will call pt to make appt to come in to see Dr. Elsworth Soho or APP

## 2020-05-13 ENCOUNTER — Telehealth: Payer: Self-pay | Admitting: Pulmonary Disease

## 2020-05-13 NOTE — Telephone Encounter (Signed)
[name] have you seen this form? Please advise, thanks

## 2020-05-14 ENCOUNTER — Encounter (HOSPITAL_COMMUNITY): Payer: Managed Care, Other (non HMO)

## 2020-05-14 ENCOUNTER — Other Ambulatory Visit (HOSPITAL_COMMUNITY): Payer: Managed Care, Other (non HMO)

## 2020-05-14 NOTE — Telephone Encounter (Signed)
I have tried to fax the surgical clearance many times and the line is busy so it will not go through. I called rebecca and made her aware and advised her to call me back.

## 2020-05-17 NOTE — Telephone Encounter (Signed)
I refaxed the form to [phone]

## 2020-05-17 NOTE — Telephone Encounter (Signed)
[phone] - FAX #1 - no issues as far as Wells Guiles knows  [phone] - FAX # [phone]

## 2020-05-18 ENCOUNTER — Ambulatory Visit (HOSPITAL_COMMUNITY)
Admission: RE | Admit: 2020-05-18 | Payer: Managed Care, Other (non HMO) | Source: Ambulatory Visit | Admitting: Orthopaedic Surgery

## 2020-05-18 ENCOUNTER — Encounter (HOSPITAL_COMMUNITY): Admission: RE | Payer: Self-pay | Source: Ambulatory Visit

## 2020-05-18 SURGERY — ARTHROPLASTY, HIP, TOTAL, ANTERIOR APPROACH
Anesthesia: Spinal | Site: Hip | Laterality: Right

## 2020-05-31 ENCOUNTER — Telehealth: Payer: Self-pay | Admitting: Acute Care

## 2020-05-31 MED ORDER — PREDNISONE 10 MG PO TABS
ORAL_TABLET | ORAL | 0 refills | Status: DC
Start: 2020-05-31 — End: 2020-06-23

## 2020-05-31 MED ORDER — DOXYCYCLINE HYCLATE 100 MG PO TABS
100.0000 mg | ORAL_TABLET | Freq: Every day | ORAL | 0 refills | Status: DC
Start: 2020-05-31 — End: 2021-01-04

## 2020-05-31 NOTE — Telephone Encounter (Signed)
Called and spoke with patient she states her symptoms started about a week and a half ago, She is having sinus drainage with productive cough with clear sputum and drainage. States she is also wheezing. Patient says she gets Bronchitis about every 3 months.   Dr. Elsworth Soho please advise patient is asking for something to be sent into pharmacy

## 2020-05-31 NOTE — Telephone Encounter (Signed)
Doxycycline 100 daily for 7 days Prednisone 10 mg tabs  Take 2 tabs daily with food x 5ds, then 1 tab daily with food x 5ds then STOP

## 2020-05-31 NOTE — Telephone Encounter (Signed)
Called and spoke with Patient.  Dr Bari Mantis recommendations given. Understanding stated.  Prescriptions sent to requested [facility] pharmacy.  Nothing further at this time.

## 2020-06-23 ENCOUNTER — Other Ambulatory Visit: Payer: Self-pay

## 2020-06-23 ENCOUNTER — Ambulatory Visit: Payer: Managed Care, Other (non HMO) | Admitting: Acute Care

## 2020-06-23 ENCOUNTER — Encounter: Payer: Self-pay | Admitting: Acute Care

## 2020-06-23 VITALS — BP 122/82 | HR 95 | Temp 97.3°F | Ht 64.0 in | Wt 132.2 lb

## 2020-06-23 DIAGNOSIS — J449 Chronic obstructive pulmonary disease, unspecified: Secondary | ICD-10-CM | POA: Diagnosis not present

## 2020-06-23 DIAGNOSIS — Z122 Encounter for screening for malignant neoplasm of respiratory organs: Secondary | ICD-10-CM

## 2020-06-23 DIAGNOSIS — F17210 Nicotine dependence, cigarettes, uncomplicated: Secondary | ICD-10-CM | POA: Diagnosis not present

## 2020-06-23 MED ORDER — PREDNISONE 10 MG (21) PO TBPK
ORAL_TABLET | ORAL | 0 refills | Status: DC
Start: 2020-06-23 — End: 2020-07-13

## 2020-06-23 MED ORDER — DOXYCYCLINE HYCLATE 100 MG PO TABS
100.0000 mg | ORAL_TABLET | Freq: Two times a day (BID) | ORAL | 0 refills | Status: DC
Start: 2020-06-23 — End: 2021-01-04

## 2020-06-23 NOTE — Progress Notes (Signed)
History of Present Illness [name] is a 59 y.o. female with current every day smoker with COPD and chronic bronchitis. She continues to smoke. She is followed by  Dr. Elsworth Soho.   06/29/2020 Pt. Presents for 3 month follow up.  She states she has been using her Symbicort . She states this has been on this for several years. She states this is helpful. She states she is using her rescue inhaler at least once daily. She has had an increase in her secretions and they have become discolored. Pt.  is smoking 2 packs per day. She has been counseled on quitting.  Pt is due for her 3 month follow up Low Dose Ct. She states she is not currently planning on quitting. Her secretions are green to yellow. . She does have a productive cough. Secretions are thick. She denies any fever, chest pain, hemoptysis or orthopnea.   1Test Results: PFT's 01/28/2020 Moderate Obstruction, Normal DLCO , no significant BD response         CBC Latest Ref Rng & Units 05/25/2010 05/23/2009 05/21/2008  WBC 4.0 - 10.5 K/uL 7.9 10.3 7.9  Hemoglobin 12.0 - 15.0 g/dL 14.0 12.9 13.0  Hematocrit 36 - 46 % 40.0 37.3 37.7  Platelets 150 - 400 K/uL 269 284 370    BMP Latest Ref Rng & Units 05/23/2009 05/21/2008  Glucose 70 - 99 mg/dL 72 84  BUN 6 - 23 mg/dL 14 6  Creatinine 0.40 - 1.20 mg/dL 0.72 0.64  Sodium 135 - 145 mEq/L 138 137  Potassium 3.5 - 5.1 mEq/L 3.0(L) 3.1(L)  Chloride 96 - 112 mEq/L 107 108  CO2 19 - 32 mEq/L 22 22  Calcium 8.4 - 10.5 mg/dL 8.9 8.0(L)    BNP No results found for: BNP  ProBNP No results found for: PROBNP  PFT    Component Value Date/Time   FEV1PRE 1.53 01/28/2020 0913   FEV1POST 1.62 01/28/2020 0913   FVCPRE 2.27 01/28/2020 0913   FVCPOST 2.50 01/28/2020 0913   TLC 5.42 01/28/2020 0913   DLCOUNC 15.22 01/28/2020 0913   PREFEV1FVCRT 67 01/28/2020 0913   PSTFEV1FVCRT 65 01/28/2020 0913    No results found.   Past medical hx Past Medical History:  Diagnosis Date  . Anxiety    . Arthritis   . Cancer ([place])   . Cancer of cervix ([place])   . Cervical spondylosis without myelopathy 08/04/2013  . COPD (chronic obstructive pulmonary disease) ([place])   . Depression   . Dyspnea    on exertion  . Fibromyalgia   . Migraine without aura, with intractable migraine, so stated, without mention of status migrainosus 03/13/2013  . Osteoporosis      Social History   Tobacco Use  . Smoking status: Current Every Day Smoker    Packs/day: 3.00    Years: 46.00    Pack years: 138.00    Types: Cigarettes  . Smokeless tobacco: Never Used  Vaping Use  . Vaping Use: Never used  Substance Use Topics  . Alcohol use: Yes    Comment: occas.  . Drug use: No    Ms.[name] reports that she has been smoking cigarettes. She has a 138.00 pack-year smoking history. She has never used smokeless tobacco. She reports current alcohol use. She reports that she does not use drugs.  Tobacco Cessation: I have spent four  minutes counseling patient on smoking cessation this visit. Patient verbalizes understanding of their  choice to continue smoking and the negative health  consequences including worsening of COPD, risk of lung cancer , stroke and heart disease.Marland Kitchen    Past surgical hx, Family hx, Social hx all reviewed.  Current Outpatient Medications on File Prior to Visit  Medication Sig  . albuterol (VENTOLIN HFA) 108 (90 Base) MCG/ACT inhaler Inhale 1-2 puffs into the lungs every 6 (six) hours as needed for wheezing or shortness of breath.  . budesonide-formoterol (SYMBICORT) 160-4.5 MCG/ACT inhaler Inhale 2 puffs into the lungs 2 (two) times daily.  . cholecalciferol (VITAMIN D3) 25 MCG (1000 UNIT) tablet Take 1,000 Units by mouth daily.  . diclofenac (VOLTAREN) 75 MG EC tablet Take 75 mg by mouth 2 (two) times daily.  Marland Kitchen docusate sodium (COLACE) 100 MG capsule Take 100 mg by mouth daily.  Marland Kitchen doxycycline (VIBRA-TABS) 100 MG tablet Take 1 tablet (100 mg total) by mouth daily.  Eduard Roux  (AIMOVIG, 140 MG DOSE, [place]) Inject 140 mg into the skin every 30 (thirty) days.   Marland Kitchen HYDROmorphone (DILAUDID) 4 MG tablet Take 4 mg by mouth every 6 (six) hours as needed for severe pain.   Marland Kitchen lamoTRIgine (LAMICTAL) 100 MG tablet Take 100 mg by mouth 2 (two) times daily.   Marland Kitchen LINZESS 145 MCG CAPS capsule Take 145 mcg by mouth daily as needed for constipation.  . Magnesium Oxide -Mg Supplement 250 MG TABS Take 250 mg by mouth daily.  . nortriptyline (PAMELOR) 75 MG capsule Take 75-150 mg by mouth at bedtime.   . pantoprazole (PROTONIX) 40 MG tablet Take 40 mg by mouth daily.  . promethazine (PHENERGAN) 25 MG tablet Take 25 mg by mouth every 6 (six) hours as needed for nausea or vomiting.  Marland Kitchen Respiratory Therapy Supplies (FLUTTER) DEVI Use as directed.  . SUMAtriptan (IMITREX) 100 MG tablet TAKE 1 TABLET (100 MG TOTAL) BY MOUTH AS NEEDED FOR MIGRAINE. (Patient taking differently: Take 50 mg by mouth every 2 (two) hours as needed for migraine. )  . tiZANidine (ZANAFLEX) 4 MG tablet Take 1 tablet (4 mg total) by mouth 3 (three) times daily. (Patient taking differently: Take 4 mg by mouth daily. )  . traZODone (DESYREL) 150 MG tablet Take 150 mg by mouth at bedtime.  . vitamin C (ASCORBIC ACID) 500 MG tablet Take 500 mg by mouth daily.   No current facility-administered medications on file prior to visit.     Allergies  Allergen Reactions  . Codeine Nausea Only  . Topamax [Topiramate] Hives  . Lyrica [Pregabalin] Swelling    Review Of Systems:  Constitutional:   No  weight loss, night sweats,  Fevers, chills, fatigue, or  lassitude.  HEENT:   No headaches,  Difficulty swallowing,  Tooth/dental problems, or  Sore throat,                No sneezing, itching, ear ache, nasal congestion, post nasal drip,   CV:  No chest pain,  Orthopnea, PND, swelling in lower extremities, anasarca, dizziness, palpitations, syncope.   GI  No heartburn, indigestion, abdominal pain, nausea, vomiting, diarrhea,  change in bowel habits, loss of appetite, bloody stools.   Resp: + shortness of breath with exertion or at rest.  + excess mucus, + productive cough,  No non-productive cough,  No coughing up of blood.  + change in color of mucus.  + wheezing.  No chest wall deformity  Skin: no rash or lesions.  GU: no dysuria, change in color of urine, no urgency or frequency.  No flank pain, no hematuria  MS:  No joint pain or swelling.  No decreased range of motion.  No back pain.  Psych:  No change in mood or affect. No depression or anxiety.  No memory loss.   Vital Signs BP 122/82 (BP Location: Left Arm, Cuff Size: Normal)   Pulse 95   Temp (!) 97.3 F (36.3 C) (Oral)   Ht 5\' 4"  (1.626 m)   Wt 132 lb 3.2 oz (60 kg)   SpO2 96%   BMI 22.69 kg/m    Physical Exam:  General- No distress,  A&Ox3, pleasant ENT: No sinus tenderness, TM clear, pale nasal mucosa, no oral exudate,no post nasal drip, no LAN Cardiac: S1, S2, regular rate and rhythm, no murmur Chest: No wheeze/ rales/ dullness; no accessory muscle use, no nasal flaring, no sternal retractions Abd.: Soft Non-tender, ND, BS +, Body mass index is 22.69 kg/m. Ext: No clubbing cyanosis, edema Neuro:  normal strength, MAE x 4. A&O x 3 Skin: No rashes, No lesions, warm and dry Psych: normal mood and behavior   Assessment/Plan  COPD Exacerbation Plan Continue Symbicort 2 puffs twice daily  Rinse mouth after use. Doxycycline 100 mg twice daily x 7 days Avoid sunlight unless you are wearing sunblock Prednisone taper; 10 mg tablets: 4 tabs x 2 days, 3 tabs x 2 days, 2 tabs x 2 days 1 tab x 2 days then stop. Take with breakfast. Continue albuterol for rescue as needed for breakthrough shortness of breath or wheezing. Use inhaler or nebs Note your daily symptoms > remember "red flags" for COPD:  Increase in cough, increase in sputum production, increase in shortness of breath or activity intolerance. If you notice these symptoms,  please call to be seen. Consider escalation to  Trelegy at follow up    Follow up in 2 weeks with Judson Roch NP ( Tele visit)   Needs Surgical Clearance for knee surgery in August Plan We will clear your for surgery closer to the time of the operation. ( Scheduled for at least August or September.  We need to make sure your pulmonary status is optimized prior to surgery.  Call us once you have a date of surgery, and we will set you up to be seen for surgical clearance.   Lung Cancer Screening Plan Past Due for her 3 month follow up.  Will send to PCP to schedule  Magdalen Spatz, NP 06/29/2020  11:57 AM

## 2020-06-23 NOTE — Patient Instructions (Addendum)
It is good to see you today Continue Symbicort 2 puffs twice daily  Rinse mouth after use. Doxycycline 100 mg twice daily x 7 days Avoid sunlight unless you are wearing sunblock Prednisone taper; 10 mg tablets: 4 tabs x 2 days, 3 tabs x 2 days, 2 tabs x 2 days 1 tab x 2 days then stop. Take with breakfast. Continue albuterol for rescue as needed for breakthrough shortness of breath or wheezing. Use inhaler or nebs Note your daily symptoms > remember "red flags" for COPD:  Increase in cough, increase in sputum production, increase in shortness of breath or activity intolerance. If you notice these symptoms, please call to be seen.    Follow up in 2 weeks with Judson Roch NP ( Tele visit)  We will clear your for surgery closer to the time of the operation. ( Scheduled for at least August or September.  We need to make sure your pulmonary status is optimized prior to surgery.  Call us once you have a date of surgery, and we will set you up to be seen for surgical clearance.   Please schedule 3 month follow up Low Dose CT Rodena Piety). Please see if we can schedule this today. 4 month follow up with Judson Roch NP or Dr. Elsworth Soho .  Please contact office for sooner follow up if symptoms do not improve or worsen or seek emergency care

## 2020-06-29 ENCOUNTER — Encounter: Payer: Self-pay | Admitting: Acute Care

## 2020-07-06 ENCOUNTER — Other Ambulatory Visit: Payer: Self-pay

## 2020-07-06 ENCOUNTER — Ambulatory Visit (INDEPENDENT_AMBULATORY_CARE_PROVIDER_SITE_OTHER)
Admission: RE | Admit: 2020-07-06 | Discharge: 2020-07-06 | Disposition: A | Discharge status: Home / Self Care | Payer: Managed Care, Other (non HMO) | Source: Ambulatory Visit | Attending: Acute Care | Admitting: Acute Care

## 2020-07-06 DIAGNOSIS — F17210 Nicotine dependence, cigarettes, uncomplicated: Secondary | ICD-10-CM

## 2020-07-08 NOTE — Progress Notes (Signed)
Please call patient and let them  know their  low dose Ct was read as a Lung RADS 2: nodules that are benign in appearance and behavior with a very low likelihood of becoming a clinically active cancer due to size or lack of growth. Recommendation per radiology is for a repeat LDCT in 12 months. .Please let them  know we will order and schedule their  annual screening scan for 06/2021. Please let them  know there was notation of CAD on their  scan.  Please remind the patient  that this is a non-gated exam therefore degree or severity of disease  cannot be determined. Please have them  follow up with their PCP regarding potential risk factor modification, dietary therapy or pharmacologic therapy if clinically indicated. Pt.  is not  currently on statin therapy. Please place order for annual  screening scan for  06/2021 and fax results to PCP. Thanks so much.

## 2020-07-09 ENCOUNTER — Telehealth: Payer: Self-pay | Admitting: Acute Care

## 2020-07-09 DIAGNOSIS — F17210 Nicotine dependence, cigarettes, uncomplicated: Secondary | ICD-10-CM

## 2020-07-12 NOTE — Telephone Encounter (Signed)
Magdalen Spatz, NP  07/08/2020 11:14 AM EDT     Please call patient and let them know their low dose Ct was read as a Lung RADS 2: nodules that are benign in appearance and behavior with a very low likelihood of becoming a clinically active cancer due to size or lack of growth. Recommendation per radiology is for a repeat LDCT in 12 months. .Please let them know we will order and schedule their annual screening scan for 06/2021. Please let them know there was notation of CAD on their scan. Please remind the patient that this is a non-gated exam therefore degree or severity of disease cannot be determined. Please have them follow up with their PCP regarding potential risk factor modification, dietary therapy or pharmacologic therapy if clinically indicated. Pt. is not currently on statin therapy. Please place order for annual screening scan for 06/2021 and fax results to PCP. Thanks so much.    Called and spoke with pt letting her know the results of the CT and stated to her that we will repeat CT in 1 year per radiology recommendations. Pt verbalized understanding. Routing to lung nodule pool so the order can be placed to have CT repeated in 1 year.

## 2020-07-12 NOTE — Telephone Encounter (Signed)
Patient is returning phone call for CT scan results. Patient phone number is [phone].

## 2020-07-12 NOTE — Progress Notes (Signed)
Virtual Visit via Telephone Note  I connected with [name] on 07/12/20 at 10:30 AM EDT by telephone and verified that I am speaking with the correct person using two identifiers.  Location: Patient: Home Provider: Office   I discussed the limitations, risks, security and privacy concerns of performing an evaluation and management service by telephone and the availability of in person appointments. I also discussed with the patient that there may be a patient responsible charge related to this service. The patient expressed understanding and agreed to proceed.   History of Present Illness: 59 year old female, current everyday smoker.  Past medical history significant for COPD/chronic bronchitis.  Patient of Dr. Elsworth Soho, last seen by pulmonary nurse practitioner on 06/23/2020 and treated for COPD exacerbation with doxycycline and prednisone taper.  Maintained on Symbicort 2 puffs twice daily.  Low-dose CT showed lung RADS 2, nodules that are benign in appearance and behavior with very low likelihood of becoming clinically active cancer due to size or lack of growth.  She needs follow-up low-dose CT in 12 months.  07/13/2020 Patient contacted today for 2-week follow-up visit following COPD exacerbation. Patient is doing pretty well. Prednisone did not help much this go around, states that she was confusion with instructions on how to take it. She was prescribed a prednisone dose pack. She ended up taking 20mg  every 6 hours. She reports some congested cough. She is getting up clear mucus .States that mucinex has been helping the most. LDCT chest showed mild centrilobular emphysema, similar bilateral pulmonary nodules largest measuring 5.2mm. She has not had covid vaccine but plans on getting it. She is down to 1/2 pack cigarettes a day. She is holding off on knee surgery until August when she gets on Medicare. She will call to set up pre-up apt closer to date.    Observations/Objective:  - Appears  well; no overt shortness of breath, wheezing or cough  Pulmonary function testing: - 01/28/20 PFTS- FVC 2.50 (77%), FEV1 1.62 (64%), ratio 65, no BD response   Assessment and Plan:  COPD: - Cough improved with mucinex - LDCT showed mild centrilobular emphysema  - Continue Symbicort 160 two puffs twice daily  - Could consider adding LAMA or changing to LABA/LAMA in the future  - Encouraged patient to get COVID-19 vaccine  Current smoker - Cutting down, currently smokes 1/2 ppd - Strongly encourage smoking cessation - LDCT in July 2021 showed LUNG RADS2, follow up in 12 months   Follow Up Instructions:  - She will need pre-op clearance for knee surgery closer to date of procedure which has not been set   I discussed the assessment and treatment plan with the patient. The patient was provided an opportunity to ask questions and all were answered. The patient agreed with the plan and demonstrated an understanding of the instructions.   The patient was advised to call back or seek an in-person evaluation if the symptoms worsen or if the condition fails to improve as anticipated.  I provided 18 minutes of non-face-to-face time during this encounter.   Martyn Ehrich, NP

## 2020-07-13 ENCOUNTER — Encounter: Payer: Self-pay | Admitting: Primary Care

## 2020-07-13 ENCOUNTER — Ambulatory Visit (INDEPENDENT_AMBULATORY_CARE_PROVIDER_SITE_OTHER): Payer: Managed Care, Other (non HMO) | Admitting: Primary Care

## 2020-07-13 ENCOUNTER — Other Ambulatory Visit: Payer: Self-pay

## 2020-07-13 DIAGNOSIS — J449 Chronic obstructive pulmonary disease, unspecified: Secondary | ICD-10-CM

## 2020-07-13 DIAGNOSIS — F17200 Nicotine dependence, unspecified, uncomplicated: Secondary | ICD-10-CM

## 2020-07-13 NOTE — Patient Instructions (Signed)
Continue Symbicort 160 two puffs twice daily  Due for repeat Low dose CT chest in July 2022 Please call to schedule pre-op exam prior to knee surgery closer to date

## 2020-07-19 NOTE — Addendum Note (Signed)
Addended by: Doroteo Glassman D on: 07/19/2020 08:51 AM   Modules accepted: Orders

## 2020-07-19 NOTE — Telephone Encounter (Signed)
Results faxed to PCP. Order placed for 1 year f/u low dose Chest Ct. Nothing further needed.

## 2020-10-14 ENCOUNTER — Other Ambulatory Visit: Payer: Self-pay

## 2020-10-14 ENCOUNTER — Ambulatory Visit
Admission: RE | Admit: 2020-10-14 | Discharge: 2020-10-14 | Disposition: A | Discharge status: Home / Self Care | Payer: Medicare HMO | Source: Ambulatory Visit | Attending: Family Medicine | Admitting: Family Medicine

## 2020-10-14 DIAGNOSIS — Z1231 Encounter for screening mammogram for malignant neoplasm of breast: Secondary | ICD-10-CM

## 2020-10-22 ENCOUNTER — Ambulatory Visit: Payer: Managed Care, Other (non HMO)

## 2020-12-09 ENCOUNTER — Other Ambulatory Visit: Payer: Self-pay | Admitting: Orthopaedic Surgery

## 2020-12-09 DIAGNOSIS — Z01818 Encounter for other preprocedural examination: Secondary | ICD-10-CM

## 2020-12-21 ENCOUNTER — Telehealth: Payer: Self-pay | Admitting: Pulmonary Disease

## 2020-12-21 NOTE — Telephone Encounter (Signed)
Called [facility] back and asked to speak with Lurena Joiner but she did not answer. Left a detailed message that the patient will seeing Sarah on 12/30/19 for a surgical clearance. We have received the paperwork. Will place the paperwork in Sarah's look-at folder as well as send a copy of this encounter to [name].   Nothing further needed at time of call.

## 2020-12-22 ENCOUNTER — Other Ambulatory Visit: Payer: Self-pay | Admitting: Orthopaedic Surgery

## 2020-12-23 NOTE — Telephone Encounter (Signed)
Thanks so much. 

## 2020-12-29 ENCOUNTER — Encounter: Payer: Self-pay | Admitting: Acute Care

## 2020-12-29 ENCOUNTER — Ambulatory Visit (INDEPENDENT_AMBULATORY_CARE_PROVIDER_SITE_OTHER): Payer: Medicare HMO | Admitting: Acute Care

## 2020-12-29 ENCOUNTER — Other Ambulatory Visit: Payer: Self-pay

## 2020-12-29 VITALS — BP 128/84 | HR 85 | Temp 97.3°F | Ht 64.0 in | Wt 128.0 lb

## 2020-12-29 DIAGNOSIS — Z23 Encounter for immunization: Secondary | ICD-10-CM

## 2020-12-29 DIAGNOSIS — J449 Chronic obstructive pulmonary disease, unspecified: Secondary | ICD-10-CM | POA: Diagnosis not present

## 2020-12-29 DIAGNOSIS — Z01818 Encounter for other preprocedural examination: Secondary | ICD-10-CM | POA: Diagnosis not present

## 2020-12-29 DIAGNOSIS — F17210 Nicotine dependence, cigarettes, uncomplicated: Secondary | ICD-10-CM

## 2020-12-29 MED ORDER — BUDESONIDE-FORMOTEROL FUMARATE 160-4.5 MCG/ACT IN AERO: 2.0000 | INHALATION_SPRAY | Freq: Two times a day (BID) | RESPIRATORY_TRACT | 3 refills | Status: DC

## 2020-12-29 NOTE — Progress Notes (Signed)
History of Present Illness [name] is a 60 y.o. female current every day smoker with COPD, bronchitis. She is followed by Dr. Vassie Loll   12/29/2020  Pt. Presents for surgical clearance. She is having a hip surgery 01/04/2021.She is compliant with her Symbicort  Daily as maintenance. She has not had to use her rescue inhaler in awhile. She states she has had a stable interval. Patient's last flare was 02/2020.  Surgery is scheduled as an outpatient at [facility]. She may spend 1 night in the hospital as her case is late in the day. She does need a refill on her Symbicort, which we will do today. Additionally she needs her pneumococcal vaccine, which we will give her today. She denies any fever, chest pain, orthopnea or hemoptysis. Marland Kitchen  1) RISK FOR PROLONGED MECHANICAL VENTILAION - > 48h  1A) Arozullah - Prolonged mech ventilation risk Arozullah Postperative Pulmonary Risk Score - for mech ventilation dependence >48h USAA, Ann Surg 2000, major non-cardiac surgery) Comment Score  Type of surgery - abd ao aneurysm (27), thoracic (21), neurosurgery / upper abdominal / vascular (21), neck (11)  0  Emergency Surgery - (11)  0  ALbumin < 3 or poor nutritional state - (9) Not collected NA  BUN > 30 -  (8)  0  Partial or completely dependent functional status - (7)  0  COPD -  (6)  6  Age - 60 to 69 (4), > 70  (6)  4  TOTAL  10  Risk Stratifcation scores  - < 10 (0.5%), 11-19 (1.8%), 20-27 (4.2%), 28-40 (10.1%), >40 (26.6%)  < 0.5      Risk ameliorating factors are    Major Pulmonary risks identified in the multifactorial risk analysis are but not limited to a) pneumonia; b) recurrent intubation risk; c) prolonged or recurrent acute respiratory failure needing mechanical ventilation; d) prolonged hospitalization; e) DVT/Pulmonary embolism; f) Acute Pulmonary edema  Please ensure you use your Symbicort morning of surgery.  Recommend 1. Short duration of surgery as much as  possible and avoid paralytic if possible 2. Recovery in step down or ICU with Pulmonary consultation 3. DVT prophylaxis 4. Aggressive pulmonary toilet with o2, bronchodilatation, and incentive spirometry and early ambulation  Aggressive Pulmonary Toilet post op.     Test Results:        CBC Latest Ref Rng & Units 05/25/2010 05/23/2009 05/21/2008  WBC 4.0 - 10.5 K/uL 7.9 10.3 7.9  Hemoglobin 12.0 - 15.0 g/dL 92.4 26.8 34.1  Hematocrit 36.0 - 46.0 % 40.0 37.3 37.7  Platelets 150 - 400 K/uL 269 284 370    BMP Latest Ref Rng & Units 05/23/2009 05/21/2008  Glucose 70 - 99 mg/dL 72 84  BUN 6 - 23 mg/dL 14 6  Creatinine 0.4 - 1.2 mg/dL 9.62 2.29  Sodium 798 - 145 mEq/L 138 137  Potassium 3.5 - 5.1 mEq/L 3.0(L) 3.1(L)  Chloride 96 - 112 mEq/L 107 108  CO2 19 - 32 mEq/L 22 22  Calcium 8.4 - 10.5 mg/dL 8.9 8.0(L)    BNP No results found for: BNP  ProBNP No results found for: PROBNP  PFT    Component Value Date/Time   FEV1PRE 1.53 01/28/2020 0913   FEV1POST 1.62 01/28/2020 0913   FVCPRE 2.27 01/28/2020 0913   FVCPOST 2.50 01/28/2020 0913   TLC 5.42 01/28/2020 0913   DLCOUNC 15.22 01/28/2020 0913   PREFEV1FVCRT 67 01/28/2020 0913   PSTFEV1FVCRT 65 01/28/2020 0913  No results found.   Past medical hx Past Medical History:  Diagnosis Date  . Anxiety   . Arthritis   . Cancer ([place])   . Cancer of cervix ([place])   . Cervical spondylosis without myelopathy 08/04/2013  . COPD (chronic obstructive pulmonary disease) ([place])   . Depression   . Dyspnea    on exertion  . Fibromyalgia   . Migraine without aura, with intractable migraine, so stated, without mention of status migrainosus 03/13/2013  . Osteoporosis      Social History   Tobacco Use  . Smoking status: Current Every Day Smoker    Packs/day: 0.50    Years: 46.00    Pack years: 23.00    Types: Cigarettes  . Smokeless tobacco: Never Used  . Tobacco comment: only puffs, does not inhale  Vaping Use  . Vaping  Use: Never used  Substance Use Topics  . Alcohol use: Yes    Comment: occas.  . Drug use: No    [name] reports that she has been smoking cigarettes. She has a 23.00 pack-year smoking history. She has never used smokeless tobacco. She reports current alcohol use. She reports that she does not use drugs.  Tobacco Cessation: Ready to quit: No Counseling given: Yes Comment: only puffs, does not inhale   Past surgical hx, Family hx, Social hx all reviewed.  Current Outpatient Medications on File Prior to Visit  Medication Sig  . albuterol (VENTOLIN HFA) 108 (90 Base) MCG/ACT inhaler Inhale 1-2 puffs into the lungs every 6 (six) hours as needed for wheezing or shortness of breath.  . budesonide-formoterol (SYMBICORT) 160-4.5 MCG/ACT inhaler Inhale 2 puffs into the lungs 2 (two) times daily.  . butalbital-acetaminophen-caffeine (FIORICET) 50-325-40 MG tablet Take 1 tablet by mouth 2 (two) times daily as needed for headache or migraine.  . cholecalciferol (VITAMIN D3) 25 MCG (1000 UNIT) tablet Take 1,000 Units by mouth daily.  . diclofenac (VOLTAREN) 75 MG EC tablet Take 75 mg by mouth 2 (two) times daily.  Marland Kitchen docusate sodium (COLACE) 100 MG capsule Take 100 mg by mouth daily.  Marland Kitchen doxycycline (VIBRA-TABS) 100 MG tablet Take 1 tablet (100 mg total) by mouth daily.  Marland Kitchen doxycycline (VIBRA-TABS) 100 MG tablet Take 1 tablet (100 mg total) by mouth 2 (two) times daily.  Eduard Roux (AIMOVIG, 140 MG DOSE, [place]) Inject 140 mg into the skin every 30 (thirty) days.   Marland Kitchen lamoTRIgine (LAMICTAL) 100 MG tablet Take 100 mg by mouth 2 (two) times daily.   Marland Kitchen LINZESS 145 MCG CAPS capsule Take 145 mcg by mouth daily as needed for constipation.  . Magnesium Oxide -Mg Supplement 250 MG TABS Take 250 mg by mouth daily.  . nortriptyline (PAMELOR) 75 MG capsule Take 75-150 mg by mouth at bedtime.   . ondansetron (ZOFRAN) 4 MG tablet Take 4 mg by mouth every 8 (eight) hours as needed for nausea or vomiting.  .  Oxycodone HCl 10 MG TABS Take 10 mg by mouth 4 (four) times daily as needed (pain).  . pantoprazole (PROTONIX) 40 MG tablet Take 40 mg by mouth daily.  . promethazine (PHENERGAN) 25 MG tablet Take 25 mg by mouth every 6 (six) hours as needed for nausea or vomiting.  Marland Kitchen Respiratory Therapy Supplies (FLUTTER) DEVI Use as directed.  . Rimegepant Sulfate (NURTEC) 75 MG TBDP Take 75 mg by mouth daily as needed (migraine).  . SUMAtriptan (IMITREX) 100 MG tablet TAKE 1 TABLET (100 MG TOTAL) BY MOUTH AS NEEDED FOR MIGRAINE. (  Patient taking differently: Take 50 mg by mouth every 2 (two) hours as needed for migraine.)  . tiZANidine (ZANAFLEX) 4 MG tablet Take 1 tablet (4 mg total) by mouth 3 (three) times daily. (Patient taking differently: Take 4 mg by mouth daily.)  . traZODone (DESYREL) 150 MG tablet Take 150 mg by mouth at bedtime.  . vitamin C (ASCORBIC ACID) 500 MG tablet Take 500 mg by mouth daily.   No current facility-administered medications on file prior to visit.     Allergies  Allergen Reactions  . Codeine Nausea Only  . Topamax [Topiramate] Hives  . Lyrica [Pregabalin] Swelling    Review Of Systems:  Constitutional:   No  weight loss, night sweats,  Fevers, chills, fatigue, or  lassitude.  HEENT:   No headaches,  Difficulty swallowing,  Tooth/dental problems, or  Sore throat,                No sneezing, itching, ear ache, nasal congestion, post nasal drip,   CV:  No chest pain,  Orthopnea, PND, swelling in lower extremities, anasarca, dizziness, palpitations, syncope.   GI  No heartburn, indigestion, abdominal pain, nausea, vomiting, diarrhea, change in bowel habits, loss of appetite, bloody stools.   Resp: No shortness of breath with exertion or at rest.  No excess mucus, no productive cough,  No non-productive cough,  No coughing up of blood.  No change in color of mucus.  No wheezing.  No chest wall deformity  Skin: no rash or lesions.  GU: no dysuria, change in color of  urine, no urgency or frequency.  No flank pain, no hematuria   MS:  No joint pain or swelling.  No decreased range of motion.  No back pain.  Psych:  No change in mood or affect. No depression or anxiety.  No memory loss.   Vital Signs BP 128/84 (BP Location: Left Arm, Cuff Size: Normal)   Pulse 85   Temp (!) 97.3 F (36.3 C) (Oral)   Ht 5\' 4"  (1.626 m)   Wt 128 lb (58.1 kg)   SpO2 97%   BMI 21.97 kg/m    Physical Exam:  General- No distress,  A&Ox3, pleasant ENT: No sinus tenderness, TM clear, pale nasal mucosa, no oral exudate,no post nasal drip, no LAN Cardiac: S1, S2, regular rate and rhythm, no murmur Chest: No wheeze/ rales/ dullness; no accessory muscle use, no nasal flaring, no sternal retractions Abd.: Soft Non-tender, ND, BS + Ext: No clubbing cyanosis, edema Neuro:  normal strength, MAE x 4, A&O x 3 Skin: No rashes, No lesions, warm and dry Psych: normal mood and behavior   Assessment/Plan  Surgical Clearance for hip replacement Stable interval COPD Risk of complications with your underlying disease is < 0.5% Plan We will clear you for surgery with noted recommendations. We will give you your pneumonia  shot today. We will fax clearance form to your orthopedist and send in today's clearance note  COPD Plan Continue Symbicort as you have been doing Rinse mouth after use. Use your rescue inhaler as needed.  Please work on quitting smoking  We will give you a card to get free nicotine patches.  Lung Cancer screening due 06/2021 Follow up with Dr. Elsworth Soho / Eustaquio Maize NP  or Judson Roch NP as needed Note your daily symptoms > remember "red flags" for COPD:  Increase in cough, increase in sputum production, increase in shortness of breath or activity intolerance. If you notice these symptoms, please call to be  seen.   Please contact office for sooner follow up if symptoms do not improve or worsen or seek emergency care  We will refill your Symbicort today  Tobacco  Abuse Seen by [place] on need to quit completely Be stronger than your excuses card given for free nicotine patches. Risks of continued smoking reviewed Next LDCT due 06/2021    Magdalen Spatz, NP 12/29/2020  3:51 PM

## 2020-12-29 NOTE — Patient Instructions (Addendum)
DUE TO COVID-19 ONLY ONE VISITOR IS ALLOWED TO COME WITH YOU AND STAY IN THE WAITING ROOM ONLY DURING PRE OP AND PROCEDURE DAY OF SURGERY. THE 1 VISITOR  MAY VISIT WITH YOU AFTER SURGERY IN YOUR PRIVATE ROOM DURING VISITING HOURS ONLY!  YOU NEED TO HAVE A COVID 19 TEST ON_1/7______ @_2 :45______, THIS TEST MUST BE DONE BEFORE SURGERY,  COVID TESTING SITE [place] [id number], IT IS ON THE RIGHT GOING OUT [address] APPROXIMATELY  2 MINUTES PAST [employer] ON THE RIGHT. ONCE YOUR COVID TEST IS COMPLETED,  PLEASE BEGIN THE QUARANTINE INSTRUCTIONS AS OUTLINED IN YOUR HANDOUT.                [name]    Your procedure is scheduled on: 01/04/21   Report to [facility] Main  Entrance   Report to admitting at  1:45 PM      Call this number if you have problems the morning of surgery [place], NO CHEWING GUM [place].   No food after midnight.    You may have clear liquid until 1:00 PM     CLEAR LIQUID DIET   Foods Allowed                                                                     Foods Excluded  Coffee and tea, regular and decaf                             liquids that you cannot  Plain Jell-O any favor except red or purple                                           see through such as: Fruit ices (not with fruit pulp)                                     milk, soups, orange juice  Iced Popsicles                                    All solid food Carbonated beverages, regular and diet                                    Cranberry, grape and apple juices Sports drinks like Gatorade Lightly seasoned clear broth or consume(fat free) Sugar, honey syrup     A t  12:30 PM drink pre surgery drink.   Nothing by mouth after 1:00 PM   Take these medicines the morning of surgery with A SIP OF WATER: Lamotrigine, Trazodone, pantoprazole 0  You may not have any metal on your body including hair pins and              piercings  Do not wear jewelry, make-up, lotions, powders or perfumes, deodorant             Do not wear nail polish on your fingernails.  Do not shave  48 hours prior to surgery.     Do not bring valuables to the hospital. [address].  Contacts, dentures or bridgework may not be worn into surgery.      Patients discharged the day of surgery will not be allowed to drive home.   IF YOU ARE HAVING SURGERY AND GOING HOME THE SAME DAY, YOU MUST HAVE AN ADULT TO DRIVE YOU HOME AND BE WITH YOU FOR 24 HOURS.  YOU MAY GO HOME BY TAXI OR UBER OR ORTHERWISE, BUT AN ADULT MUST ACCOMPANY YOU HOME AND STAY WITH YOU FOR 24 HOURS.  Name and phone number of your driver:  Special Instructions: N/A              Please read over the following fact sheets you were given: _____________________________________________________________________             [facility] - Preparing for Surgery Before surgery, you can play an important role.   Because skin is not sterile, your skin needs to be as free of germs as possible.   You can reduce the number of germs on your skin by washing with CHG (chlorahexidine gluconate) soap before surgery.   CHG is an antiseptic cleaner which kills germs and bonds with the skin to continue killing germs even after washing. Please DO NOT use if you have an allergy to CHG or antibacterial soaps.   If your skin becomes reddened/irritated stop using the CHG and inform your nurse when you arrive at Short Stay. Do not shave (including legs and underarms) for at least 48 hours prior to the first CHG shower. . Please follow these instructions carefully:   1.  Shower with CHG Soap the night before surgery and the  morning of Surgery.  2.  If you choose to wash your hair, wash your hair first as usual with your  normal  shampoo.  3.  After you shampoo,  rinse your hair and body thoroughly to remove the  shampoo.                                        4.  Use CHG as you would any other liquid soap.  You can apply chg directly  to the skin and wash                       Gently with a scrungie or clean washcloth.  5.  Apply the CHG Soap to your body ONLY FROM THE NECK DOWN.   Do not use on face/ open                           Wound or open sores. Avoid contact with eyes, ears mouth and genitals (private parts).                       Wash  face,  Genitals (private parts) with your normal soap.             6.  Wash thoroughly, paying special attention to the area where your surgery  will be performed.  7.  Thoroughly rinse your body with warm water from the neck down.  8.  DO NOT shower/wash with your normal soap after using and rinsing off  the CHG Soap.             9.  Pat yourself dry with a clean towel.            10.  Wear clean pajamas.            11.  Place clean sheets on your bed the night of your first shower and do not  sleep with pets. Day of Surgery : Do not apply any lotions/deodorants the morning of surgery.  Please wear clean clothes to the hospital/surgery center.  FAILURE TO FOLLOW THESE INSTRUCTIONS MAY RESULT IN THE CANCELLATION OF YOUR SURGERY PATIENT SIGNATURE_________________________________  NURSE SIGNATURE__________________________________  ________________________________________________________________________   [name]  An incentive spirometer is a tool that can help keep your lungs clear and active. This tool measures how well you are filling your lungs with each breath. Taking long deep breaths may help reverse or decrease the chance of developing breathing (pulmonary) problems (especially infection) following:  A long period of time when you are unable to move or be active. BEFORE THE PROCEDURE   If the spirometer includes an indicator to show your best effort, your nurse or respiratory therapist  will set it to a desired goal.  If possible, sit up straight or lean slightly forward. Try not to slouch.  Hold the incentive spirometer in an upright position. INSTRUCTIONS FOR USE  1. Sit on the edge of your bed if possible, or sit up as far as you can in bed or on a chair. 2. Hold the incentive spirometer in an upright position. 3. Breathe out normally. 4. Place the mouthpiece in your mouth and seal your lips tightly around it. 5. Breathe in slowly and as deeply as possible, raising the piston or the ball toward the top of the column. 6. Hold your breath for 3-5 seconds or for as long as possible. Allow the piston or ball to fall to the bottom of the column. 7. Remove the mouthpiece from your mouth and breathe out normally. 8. Rest for a few seconds and repeat Steps 1 through 7 at least 10 times every 1-2 hours when you are awake. Take your time and take a few normal breaths between deep breaths. 9. The spirometer may include an indicator to show your best effort. Use the indicator as a goal to work toward during each repetition. 10. After each set of 10 deep breaths, practice coughing to be sure your lungs are clear. If you have an incision (the cut made at the time of surgery), support your incision when coughing by placing a pillow or rolled up towels firmly against it. Once you are able to get out of bed, walk around indoors and cough well. You may stop using the incentive spirometer when instructed by your caregiver.  RISKS AND COMPLICATIONS  Take your time so you do not get dizzy or light-headed.  If you are in pain, you may need to take or ask for pain medication before doing incentive spirometry. It is harder to take a deep breath if you are having pain. AFTER USE  Rest  and breathe slowly and easily.  It can be helpful to keep track of a log of your progress. Your caregiver can provide you with a simple table to help with this. If you are using the spirometer at home, follow  these instructions: SEEK MEDICAL CARE IF:   You are having difficultly using the spirometer.  You have trouble using the spirometer as often as instructed.  Your pain medication is not giving enough relief while using the spirometer.  You develop fever of 100.5 F (38.1 C) or higher. SEEK IMMEDIATE MEDICAL CARE IF:   You cough up bloody sputum that had not been present before.  You develop fever of 102 F (38.9 C) or greater.  You develop worsening pain at or near the incision site. MAKE SURE YOU:   Understand these instructions.  Will watch your condition.  Will get help right away if you are not doing well or get worse. Document Released: 04/23/2007 Document Revised: 03/04/2012 Document Reviewed: 06/24/2007 [facility] Patient Information 2014 [place], [place].   ________________________________________________________________________

## 2020-12-29 NOTE — Patient Instructions (Addendum)
It is good to see you today. We will clear you for surgery with noted recommendations. We will give you your pneumonia  shot today. Continue Symbicort as you have been doing Rinse mouth after use. Use your rescue inhaler as needed.  Please work on quitting smoking  We will give you a card to get free nicotine patches.  Lung Cancer screening due 06/2021 Follow up with Dr. Vassie Loll / Waynetta Sandy NP  or Maralyn Sago NP as needed Note your daily symptoms > remember "red flags" for COPD:  Increase in cough, increase in sputum production, increase in shortness of breath or activity intolerance. If you notice these symptoms, please call to be seen.   Please contact office for sooner follow up if symptoms do not improve or worsen or seek emergency care  We will refill your Symbicort today

## 2020-12-31 ENCOUNTER — Other Ambulatory Visit (HOSPITAL_COMMUNITY)
Admission: RE | Admit: 2020-12-31 | Discharge: 2020-12-31 | Disposition: A | Discharge status: Home / Self Care | Payer: Medicare HMO | Source: Ambulatory Visit | Attending: Orthopaedic Surgery | Admitting: Orthopaedic Surgery

## 2020-12-31 ENCOUNTER — Other Ambulatory Visit: Payer: Self-pay

## 2020-12-31 ENCOUNTER — Encounter (HOSPITAL_COMMUNITY): Payer: Self-pay

## 2020-12-31 ENCOUNTER — Encounter (HOSPITAL_COMMUNITY)
Admission: RE | Admit: 2020-12-31 | Discharge: 2020-12-31 | Disposition: A | Discharge status: Home / Self Care | Payer: Medicare HMO | Source: Ambulatory Visit | Attending: Orthopaedic Surgery | Admitting: Orthopaedic Surgery

## 2020-12-31 ENCOUNTER — Ambulatory Visit (HOSPITAL_COMMUNITY)
Admission: RE | Admit: 2020-12-31 | Discharge: 2020-12-31 | Disposition: A | Discharge status: Home / Self Care | Payer: Medicare HMO | Source: Ambulatory Visit | Attending: Orthopaedic Surgery | Admitting: Orthopaedic Surgery

## 2020-12-31 DIAGNOSIS — Z01818 Encounter for other preprocedural examination: Secondary | ICD-10-CM | POA: Diagnosis not present

## 2020-12-31 DIAGNOSIS — Z20822 Contact with and (suspected) exposure to covid-19: Secondary | ICD-10-CM | POA: Insufficient documentation

## 2020-12-31 LAB — CBC WITH DIFFERENTIAL/PLATELET
Abs Immature Granulocytes: 0.02 10*3/uL (ref 0.00–0.07)
Basophils Absolute: 0.1 10*3/uL (ref 0.0–0.1)
Basophils Relative: 1 %
Eosinophils Absolute: 0.2 10*3/uL (ref 0.0–0.5)
Eosinophils Relative: 2 %
HCT: 41.8 % (ref 36.0–46.0)
Hemoglobin: 13.4 g/dL (ref 12.0–15.0)
Immature Granulocytes: 0 %
Lymphocytes Relative: 27 %
Lymphs Abs: 1.7 10*3/uL (ref 0.7–4.0)
MCH: 29.5 pg (ref 26.0–34.0)
MCHC: 32.1 g/dL (ref 30.0–36.0)
MCV: 92.1 fL (ref 80.0–100.0)
Monocytes Absolute: 0.3 10*3/uL (ref 0.1–1.0)
Monocytes Relative: 4 %
Neutro Abs: 4.3 10*3/uL (ref 1.7–7.7)
Neutrophils Relative %: 66 %
Platelets: 381 10*3/uL (ref 150–400)
RBC: 4.54 MIL/uL (ref 3.87–5.11)
RDW: 15.7 % — ABNORMAL HIGH (ref 11.5–15.5)
WBC: 6.6 10*3/uL (ref 4.0–10.5)
nRBC: 0 % (ref 0.0–0.2)

## 2020-12-31 LAB — SURGICAL PCR SCREEN
MRSA, PCR: NEGATIVE
Staphylococcus aureus: NEGATIVE

## 2020-12-31 LAB — URINALYSIS, ROUTINE W REFLEX MICROSCOPIC
Bilirubin Urine: NEGATIVE
Glucose, UA: NEGATIVE mg/dL
Hgb urine dipstick: NEGATIVE
Ketones, ur: NEGATIVE mg/dL
Nitrite: NEGATIVE
Protein, ur: NEGATIVE mg/dL
Specific Gravity, Urine: 1.01 (ref 1.005–1.030)
pH: 7 (ref 5.0–8.0)

## 2020-12-31 LAB — BASIC METABOLIC PANEL
Anion gap: 11 (ref 5–15)
BUN: 10 mg/dL (ref 8–23)
CO2: 27 mmol/L (ref 22–32)
Calcium: 9.5 mg/dL (ref 8.9–10.3)
Chloride: 104 mmol/L (ref 98–111)
Creatinine, Ser: 0.74 mg/dL (ref 0.44–1.00)
GFR, Estimated: >60 mL/min (ref >60)
Glucose, Bld: 112 mg/dL — ABNORMAL HIGH (ref 70–99)
Potassium: 3.9 mmol/L (ref 3.5–5.1)
Sodium: 142 mmol/L (ref 135–145)

## 2020-12-31 LAB — SARS CORONAVIRUS 2 (TAT 6-24 HRS): SARS Coronavirus 2: NEGATIVE

## 2020-12-31 LAB — APTT: aPTT: 31 seconds (ref 24–36)

## 2020-12-31 LAB — PROTIME-INR
INR: 1 (ref 0.8–1.2)
Prothrombin Time: 12.8 seconds (ref 11.4–15.2)

## 2020-12-31 NOTE — Progress Notes (Signed)
COVID Vaccine Completed:yes Date COVID Vaccine completed:one shot 07/28/20 COVID vaccine manufacturer: [employer]     PCP - [place] Cardiologist - no  Chest x-ray - 01/01/20-epic EKG - 12/10/20-epic Stress Test - no ECHO - no Cardiac Cath - no Pacemaker/ICD device last checked:NA  Sleep Study - no CPAP -   Fasting Blood Sugar - NA Checks Blood Sugar _____ times a day  Blood Thinner Instructions:NA Aspirin Instructions: Last Dose:  Anesthesia review:   Patient denies shortness of breath, fever, cough and chest pain at PAT appointment  yes Patient verbalized understanding of instructions that were given to them at the PAT appointment. Patient was also instructed that they will need to review over the PAT instructions again at home before surgery.Yes Pt has copd and is SOB with climbing stairs and doing housework but not ADLs

## 2021-01-03 MED ORDER — TRANEXAMIC ACID 1000 MG/10ML IV SOLN
2000.0000 mg | INTRAVENOUS | Status: DC
  Filled 2021-01-03: qty 20

## 2021-01-03 MED ORDER — BUPIVACAINE LIPOSOME 1.3 % IJ SUSP
10.0000 mL | Freq: Once | INTRAMUSCULAR | Status: DC
  Filled 2021-01-03: qty 10

## 2021-01-03 NOTE — H&P (Signed)
TOTAL HIP ADMISSION H&P  Patient is admitted for right total hip arthroplasty.  Subjective:  Chief Complaint: right hip pain  HPI: [name], 60 y.o. female, has a history of pain and functional disability in the right hip(s) due to arthritis and patient has failed non-surgical conservative treatments for greater than 12 weeks to include NSAID's and/or analgesics, corticosteriod injections, flexibility and strengthening excercises, use of assistive devices, weight reduction as appropriate and activity modification.  Onset of symptoms was gradual starting 5 years ago with gradually worsening course since that time.The patient noted no past surgery on the right hip(s).  Patient currently rates pain in the right hip at 10 out of 10 with activity. Patient has night pain, worsening of pain with activity and weight bearing, trendelenberg gait, pain that interfers with activities of daily living and crepitus. Patient has evidence of subchondral cysts, subchondral sclerosis, periarticular osteophytes and joint space narrowing by imaging studies. This condition presents safety issues increasing the risk of falls.  There is no current active infection.  Patient Active Problem List   Diagnosis Date Noted  . Abnormal screening CT of chest 02/09/2020  . Healthcare maintenance 02/09/2020  . COPD with chronic bronchitis and emphysema ([place]) 10/28/2019  . Tobacco abuse 10/28/2019  . Cervical spondylosis without myelopathy 08/04/2013  . Migraine without aura, with intractable migraine, so stated, without mention of status migrainosus 03/13/2013   Past Medical History:  Diagnosis Date  . Anxiety   . Arthritis   . Cancer ([place])   . Cancer of cervix ([place])   . Cervical spondylosis without myelopathy 08/04/2013  . COPD (chronic obstructive pulmonary disease) ([place])   . Depression   . Dyspnea    on exertion  . Fibromyalgia   . Migraine without aura, with intractable migraine, so stated, without mention of  status migrainosus 12/2020  . Osteoporosis     Past Surgical History:  Procedure Laterality Date  . ABDOMINAL HYSTERECTOMY    . CESAREAN SECTION    . LUMBAR LAMINECTOMY    . SPINE SURGERY    . TUBAL LIGATION    . ULNAR NERVE TRANSPOSITION Left     Current Facility-Administered Medications  Medication Dose Route Frequency Provider Last Rate Last Admin  . [START ON 01/04/2021] bupivacaine liposome (EXPAREL) 1.3 % injection 133 mg  10 mL Other Once Melrose Nakayama, MD      . Derrill Memo ON 01/04/2021] tranexamic acid (CYKLOKAPRON) 2,000 mg in sodium chloride 0.9 % 50 mL Topical Application  [id number] mg Topical To OR Melrose Nakayama, MD       Current Outpatient Medications  Medication Sig Dispense Refill Last Dose  . albuterol (VENTOLIN HFA) 108 (90 Base) MCG/ACT inhaler Inhale 1-2 puffs into the lungs every 6 (six) hours as needed for wheezing or shortness of breath.     . butalbital-acetaminophen-caffeine (FIORICET) 50-325-40 MG tablet Take 1 tablet by mouth 2 (two) times daily as needed for headache or migraine.     . cholecalciferol (VITAMIN D3) 25 MCG (1000 UNIT) tablet Take 1,000 Units by mouth daily.     . diclofenac (VOLTAREN) 75 MG EC tablet Take 75 mg by mouth 2 (two) times daily.     Marland Kitchen docusate sodium (COLACE) 100 MG capsule Take 100 mg by mouth daily.     Eduard Roux (AIMOVIG, 140 MG DOSE, [place]) Inject 140 mg into the skin every 30 (thirty) days.      Marland Kitchen lamoTRIgine (LAMICTAL) 100 MG tablet Take 100 mg by mouth 2 (two)  times daily.      Marland Kitchen LINZESS 145 MCG CAPS capsule Take 145 mcg by mouth daily as needed for constipation.     . Magnesium Oxide -Mg Supplement 250 MG TABS Take 250 mg by mouth daily.     . nortriptyline (PAMELOR) 75 MG capsule Take 75-150 mg by mouth at bedtime.      . ondansetron (ZOFRAN) 4 MG tablet Take 4 mg by mouth every 8 (eight) hours as needed for nausea or vomiting.     . Oxycodone HCl 10 MG TABS Take 10 mg by mouth 4 (four) times daily as needed (pain).     .  pantoprazole (PROTONIX) 40 MG tablet Take 40 mg by mouth daily.     . promethazine (PHENERGAN) 25 MG tablet Take 25 mg by mouth every 6 (six) hours as needed for nausea or vomiting.     . Rimegepant Sulfate (NURTEC) 75 MG TBDP Take 75 mg by mouth daily as needed (migraine).     . SUMAtriptan (IMITREX) 100 MG tablet TAKE 1 TABLET (100 MG TOTAL) BY MOUTH AS NEEDED FOR MIGRAINE. (Patient taking differently: Take 50 mg by mouth every 2 (two) hours as needed for migraine.) 9 tablet 0   . tiZANidine (ZANAFLEX) 4 MG tablet Take 1 tablet (4 mg total) by mouth 3 (three) times daily. (Patient taking differently: Take 4 mg by mouth daily.) 90 tablet 0   . traZODone (DESYREL) 150 MG tablet Take 150 mg by mouth at bedtime.     . vitamin C (ASCORBIC ACID) 500 MG tablet Take 500 mg by mouth daily.     . budesonide-formoterol (SYMBICORT) 160-4.5 MCG/ACT inhaler Inhale 2 puffs into the lungs 2 (two) times daily. 1 each 3   . doxycycline (VIBRA-TABS) 100 MG tablet Take 1 tablet (100 mg total) by mouth daily. 7 tablet 0 Not Taking at Unknown time  . doxycycline (VIBRA-TABS) 100 MG tablet Take 1 tablet (100 mg total) by mouth 2 (two) times daily. 14 tablet 0 Not Taking at Unknown time  . Respiratory Therapy Supplies (FLUTTER) DEVI Use as directed. 1 each 0    Allergies  Allergen Reactions  . Codeine Nausea Only  . Topamax [Topiramate] Hives  . Lyrica [Pregabalin] Swelling    Social History   Tobacco Use  . Smoking status: Current Every Day Smoker    Packs/day: 0.25    Years: 46.00    Pack years: 11.50    Types: Cigarettes  . Smokeless tobacco: Never Used  . Tobacco comment: only puffs, does not inhale  Substance Use Topics  . Alcohol use: Yes    Comment: occas.    Family History  Problem Relation Age of Onset  . Cancer Father   . Cancer Mother      Review of Systems  Musculoskeletal: Positive for arthralgias.       Right hip  All other systems reviewed and are  negative.   Objective:  Physical Exam Constitutional:      Appearance: Normal appearance.  HENT:     Head: Normocephalic and atraumatic.     Nose: Nose normal.     Mouth/Throat:     Pharynx: Oropharynx is clear.  Eyes:     Extraocular Movements: Extraocular movements intact.     Pupils: Pupils are equal, round, and reactive to light.  Cardiovascular:     Rate and Rhythm: Normal rate and regular rhythm.  Pulmonary:     Effort: Pulmonary effort is normal.  Abdominal:  Palpations: Abdomen is soft.  Musculoskeletal:     Cervical back: Normal range of motion.     Comments: Examination right hip shows extremely painful internal range of motion.  Leg lengths are roughly equal.  She walks with a altered gait.  Normal sensation and motor function throughout both lower extremities.  She is neurovascularly intact distally bilaterally.    Skin:    General: Skin is warm and dry.  Neurological:     General: No focal deficit present.     Mental Status: She is alert and oriented to person, place, and time.  Psychiatric:        Mood and Affect: Mood normal.        Behavior: Behavior normal.        Thought Content: Thought content normal.        Judgment: Judgment normal.     Vital signs in last 24 hours:    Labs:   Estimated body mass index is 21.97 kg/m as calculated from the following:   Height as of 12/29/20: 5\' 4"  (1.626 m).   Weight as of 12/29/20: 58.1 kg.   Imaging Review Plain radiographs demonstrate severe degenerative joint disease of the right hip(s). The bone quality appears to be good for age and reported activity level.      Assessment/Plan:  End stage primary arthritis, right hip(s)  The patient history, physical examination, clinical judgement of the provider and imaging studies are consistent with end stage degenerative joint disease of the right hip(s) and total hip arthroplasty is deemed medically necessary. The treatment options including medical  management, injection therapy, arthroscopy and arthroplasty were discussed at length. The risks and benefits of total hip arthroplasty were presented and reviewed. The risks due to aseptic loosening, infection, stiffness, dislocation/subluxation,  thromboembolic complications and other imponderables were discussed.  The patient acknowledged the explanation, agreed to proceed with the plan and consent was signed. Patient is being admitted for inpatient treatment for surgery, pain control, PT, OT, prophylactic antibiotics, VTE prophylaxis, progressive ambulation and ADL's and discharge planning.The patient is planning to be discharged home with home health services

## 2021-01-04 ENCOUNTER — Ambulatory Visit (HOSPITAL_COMMUNITY)
Admission: RE | Admit: 2021-01-04 | Discharge: 2021-01-04 | Disposition: A | Discharge status: Home / Self Care | Payer: Medicare HMO | Source: Ambulatory Visit | Attending: Orthopaedic Surgery | Admitting: Orthopaedic Surgery

## 2021-01-04 ENCOUNTER — Ambulatory Visit (HOSPITAL_COMMUNITY): Payer: Medicare HMO

## 2021-01-04 ENCOUNTER — Encounter (HOSPITAL_COMMUNITY): Payer: Self-pay | Admitting: Orthopaedic Surgery

## 2021-01-04 ENCOUNTER — Other Ambulatory Visit: Payer: Self-pay

## 2021-01-04 ENCOUNTER — Ambulatory Visit (HOSPITAL_COMMUNITY): Payer: Medicare HMO | Admitting: Physician Assistant

## 2021-01-04 ENCOUNTER — Ambulatory Visit (HOSPITAL_COMMUNITY): Payer: Medicare HMO | Admitting: Certified Registered"

## 2021-01-04 ENCOUNTER — Encounter (HOSPITAL_COMMUNITY)
Admission: RE | Disposition: A | Discharge status: Home / Self Care | Payer: Self-pay | Source: Ambulatory Visit | Attending: Orthopaedic Surgery

## 2021-01-04 DIAGNOSIS — M1611 Unilateral primary osteoarthritis, right hip: Secondary | ICD-10-CM | POA: Insufficient documentation

## 2021-01-04 DIAGNOSIS — K30 Functional dyspepsia: Secondary | ICD-10-CM | POA: Diagnosis not present

## 2021-01-04 DIAGNOSIS — Z538 Procedure and treatment not carried out for other reasons: Secondary | ICD-10-CM | POA: Insufficient documentation

## 2021-01-04 LAB — TYPE AND SCREEN
ABO/RH(D): A POS
Antibody Screen: NEGATIVE

## 2021-01-04 SURGERY — ARTHROPLASTY, HIP, TOTAL, ANTERIOR APPROACH
Anesthesia: Spinal | Site: Hip | Laterality: Right

## 2021-01-04 MED ORDER — SODIUM CHLORIDE (PF) 0.9 % IJ SOLN
INTRAMUSCULAR | Status: AC
  Filled 2021-01-04: qty 30

## 2021-01-04 MED ORDER — POVIDONE-IODINE 10 % EX SWAB
2.0000 "application " | Freq: Once | CUTANEOUS | Status: AC
  Administered 2021-01-04: 2 via TOPICAL

## 2021-01-04 MED ORDER — HYDROMORPHONE HCL 1 MG/ML IJ SOLN: 0.2500 mg | INTRAMUSCULAR | Status: DC | PRN

## 2021-01-04 MED ORDER — CEFAZOLIN SODIUM-DEXTROSE 2-4 GM/100ML-% IV SOLN
2.0000 g | INTRAVENOUS | Status: AC
  Administered 2021-01-04: 2 g via INTRAVENOUS
  Filled 2021-01-04: qty 100

## 2021-01-04 MED ORDER — PHENYLEPHRINE 40 MCG/ML (10ML) SYRINGE FOR IV PUSH (FOR BLOOD PRESSURE SUPPORT)
PREFILLED_SYRINGE | INTRAVENOUS | Status: AC
  Filled 2021-01-04: qty 10

## 2021-01-04 MED ORDER — TRANEXAMIC ACID-NACL 1000-0.7 MG/100ML-% IV SOLN
1000.0000 mg | INTRAVENOUS | Status: AC
  Administered 2021-01-04: 800 mg via INTRAVENOUS
  Filled 2021-01-04: qty 100

## 2021-01-04 MED ORDER — PHENYLEPHRINE 40 MCG/ML (10ML) SYRINGE FOR IV PUSH (FOR BLOOD PRESSURE SUPPORT)
PREFILLED_SYRINGE | INTRAVENOUS | Status: DC | PRN
  Administered 2021-01-04 (×2): 120 ug via INTRAVENOUS

## 2021-01-04 MED ORDER — MEPERIDINE HCL 50 MG/ML IJ SOLN
6.2500 mg | INTRAMUSCULAR | Status: DC | PRN
Start: 2021-01-04 — End: 2021-01-05

## 2021-01-04 MED ORDER — PROPOFOL 10 MG/ML IV BOLUS
INTRAVENOUS | Status: DC | PRN
  Administered 2021-01-04: 30 mg via INTRAVENOUS
  Administered 2021-01-04: 50 mg via INTRAVENOUS

## 2021-01-04 MED ORDER — PHENYLEPHRINE HCL-NACL 10-0.9 MG/250ML-% IV SOLN
INTRAVENOUS | Status: DC | PRN
  Administered 2021-01-04: 20 ug/min via INTRAVENOUS

## 2021-01-04 MED ORDER — ONDANSETRON HCL 4 MG/2ML IJ SOLN
INTRAMUSCULAR | Status: DC | PRN
  Administered 2021-01-04: 4 mg via INTRAVENOUS

## 2021-01-04 MED ORDER — LACTATED RINGERS IV SOLN: INTRAVENOUS | Status: DC

## 2021-01-04 MED ORDER — ACETAMINOPHEN 10 MG/ML IV SOLN
1000.0000 mg | Freq: Once | INTRAVENOUS | Status: DC | PRN
Start: 2021-01-04 — End: 2021-01-05

## 2021-01-04 MED ORDER — BUPIVACAINE IN DEXTROSE 0.75-8.25 % IT SOLN
INTRATHECAL | Status: DC | PRN
  Administered 2021-01-04: 1.8 mL via INTRATHECAL

## 2021-01-04 MED ORDER — BUPIVACAINE-EPINEPHRINE 0.5% -1:200000 IJ SOLN
INTRAMUSCULAR | Status: AC
  Filled 2021-01-04: qty 1

## 2021-01-04 MED ORDER — ROCURONIUM BROMIDE 10 MG/ML (PF) SYRINGE
PREFILLED_SYRINGE | INTRAVENOUS | Status: AC
  Filled 2021-01-04: qty 10

## 2021-01-04 MED ORDER — ORAL CARE MOUTH RINSE: 15.0000 mL | Freq: Once | OROMUCOSAL | Status: AC

## 2021-01-04 MED ORDER — ACETAMINOPHEN 325 MG PO TABS
325.0000 mg | ORAL_TABLET | Freq: Once | ORAL | Status: AC | PRN
Start: 2021-01-04 — End: 2021-01-04
  Administered 2021-01-04: 500 mg via ORAL

## 2021-01-04 MED ORDER — AMISULPRIDE (ANTIEMETIC) 5 MG/2ML IV SOLN: 10.0000 mg | Freq: Once | INTRAVENOUS | Status: DC | PRN

## 2021-01-04 MED ORDER — CIPROFLOXACIN HCL 500 MG PO TABS: 500.0000 mg | ORAL_TABLET | Freq: Two times a day (BID) | ORAL | 0 refills | Status: AC

## 2021-01-04 MED ORDER — FENTANYL CITRATE (PF) 250 MCG/5ML IJ SOLN
INTRAMUSCULAR | Status: DC | PRN
  Administered 2021-01-04: 25 ug via INTRAVENOUS

## 2021-01-04 MED ORDER — CHLORHEXIDINE GLUCONATE 0.12 % MT SOLN
15.0000 mL | Freq: Once | OROMUCOSAL | Status: AC
  Administered 2021-01-04: 15 mL via OROMUCOSAL

## 2021-01-04 MED ORDER — ACETAMINOPHEN 500 MG PO TABS
ORAL_TABLET | ORAL | Status: AC
  Filled 2021-01-04: qty 1

## 2021-01-04 MED ORDER — EPHEDRINE 5 MG/ML INJ
INTRAVENOUS | Status: AC
  Filled 2021-01-04: qty 10

## 2021-01-04 MED ORDER — OXYCODONE HCL 5 MG/5ML PO SOLN: 5.0000 mg | Freq: Once | ORAL | Status: DC | PRN

## 2021-01-04 MED ORDER — PROPOFOL 500 MG/50ML IV EMUL
INTRAVENOUS | Status: DC | PRN
  Administered 2021-01-04: 50 ug/kg/min via INTRAVENOUS

## 2021-01-04 MED ORDER — OXYCODONE HCL 5 MG PO TABS: 5.0000 mg | ORAL_TABLET | Freq: Once | ORAL | Status: DC | PRN

## 2021-01-04 MED ORDER — SUGAMMADEX SODIUM 500 MG/5ML IV SOLN
INTRAVENOUS | Status: AC
  Filled 2021-01-04: qty 5

## 2021-01-04 MED ORDER — FENTANYL CITRATE (PF) 100 MCG/2ML IJ SOLN
INTRAMUSCULAR | Status: AC
  Filled 2021-01-04: qty 2

## 2021-01-04 MED ORDER — ACETAMINOPHEN 160 MG/5ML PO SOLN: 325.0000 mg | Freq: Once | ORAL | Status: AC | PRN

## 2021-01-04 SURGICAL SUPPLY — 38 items
BAG DECANTER FOR FLEXI CONT (MISCELLANEOUS) ×2 IMPLANT
BLADE SAW SGTL 18X1.27X75 (BLADE) ×2 IMPLANT
BOOTIES KNEE HIGH SLOAN (MISCELLANEOUS) ×2 IMPLANT
CELLS DAT CNTRL 66122 CELL SVR (MISCELLANEOUS) ×1 IMPLANT
COVER PERINEAL POST (MISCELLANEOUS) ×2 IMPLANT
COVER SURGICAL LIGHT HANDLE (MISCELLANEOUS) ×2 IMPLANT
COVER WAND RF STERILE (DRAPES) IMPLANT
DECANTER SPIKE VIAL GLASS SM (MISCELLANEOUS) ×2 IMPLANT
DRAPE IMP U-DRAPE 54X76 (DRAPES) ×2 IMPLANT
DRAPE ORTHO SPLIT 77X108 STRL (DRAPES)
DRAPE STERI IOBAN 125X83 (DRAPES) ×2 IMPLANT
DRAPE SURG ORHT 6 SPLT 77X108 (DRAPES) IMPLANT
DRAPE U-SHAPE 47X51 STRL (DRAPES) ×4 IMPLANT
DRSG AQUACEL AG ADV 3.5X 6 (GAUZE/BANDAGES/DRESSINGS) ×2 IMPLANT
DURAPREP 26ML APPLICATOR (WOUND CARE) ×2 IMPLANT
ELECT BLADE TIP CTD 4 INCH (ELECTRODE) ×2 IMPLANT
ELECT REM PT RETURN 15FT ADLT (MISCELLANEOUS) ×2 IMPLANT
GLOVE BIO SURGEON STRL SZ8 (GLOVE) ×4 IMPLANT
GLOVE SRG 8 PF TXTR STRL LF DI (GLOVE) ×2 IMPLANT
GLOVE SURG UNDER POLY LF SZ8 (GLOVE) ×2
GOWN STRL REUS W/TWL XL LVL3 (GOWN DISPOSABLE) ×4 IMPLANT
HOLDER FOLEY CATH W/STRAP (MISCELLANEOUS) ×2 IMPLANT
KIT TURNOVER KIT A (KITS) IMPLANT
MANIFOLD NEPTUNE II (INSTRUMENTS) ×2 IMPLANT
NEEDLE HYPO 22GX1.5 SAFETY (NEEDLE) ×2 IMPLANT
NS IRRIG 1000ML POUR BTL (IV SOLUTION) ×2 IMPLANT
PACK ANTERIOR HIP CUSTOM (KITS) ×2 IMPLANT
PENCIL SMOKE EVACUATOR (MISCELLANEOUS) IMPLANT
PROTECTOR NERVE ULNAR (MISCELLANEOUS) ×2 IMPLANT
RTRCTR WOUND ALEXIS 18CM MED (MISCELLANEOUS) ×2
SUT ETHIBOND NAB CT1 #1 30IN (SUTURE) ×4 IMPLANT
SUT VIC AB 1 CT1 36 (SUTURE) ×2 IMPLANT
SUT VIC AB 2-0 CT1 27 (SUTURE) ×1
SUT VIC AB 2-0 CT1 TAPERPNT 27 (SUTURE) ×1 IMPLANT
SUT VICRYL AB 3-0 FS1 BRD 27IN (SUTURE) ×2 IMPLANT
SUT VLOC 180 0 24IN GS25 (SUTURE) ×2 IMPLANT
SYR 50ML LL SCALE MARK (SYRINGE) ×2 IMPLANT
TRAY FOLEY MTR SLVR 16FR STAT (SET/KITS/TRAYS/PACK) ×2 IMPLANT

## 2021-01-04 NOTE — Discharge Instructions (Signed)
Spinal Anesthesia and Epidural Anesthesia, Care After This sheet gives you information about how to care for yourself after your procedure. Your doctor may also give you more specific instructions. If you have problems or questions, contact your doctor. What can I expect after the procedure? While the medicines you were given are still having effects, it is common to:  Feel like you may vomit (nauseous).  Vomit.  Have a numb feeling or tingling in your legs.  Have problems when you pee (urinate).  Feel itchy. Follow these instructions at home: For the time period you were told by your doctor:  Rest.  Do not do activities where you could fall or get hurt.  Do not drive or use machines.  Do not drink alcohol.  Do not take sleeping pills or medicines that make you drowsy.  Do not make big decisions or sign legal documents.  Do not take care of children on your own.   Eating and drinking  If you vomit, wait a short time. When you can drink without vomiting, try to drink some water, juice, or clear soup.  Drink enough fluid to keep your pee (urine) pale yellow.  Make sure you do not feel like vomiting before you eat solid foods.  Follow the diet that your doctor recommends. General instructions  If you have sleep apnea, surgery and certain medicines can raise your risk for breathing problems. Follow instructions from your doctor about when to wear your sleep device.  Have a responsible adult stay with you for the time you are told. It is important to have someone help care for you until you are awake and alert.  Return to your normal activities as told by your doctor. Ask your doctor what activities are safe for you.  Take over-the-counter and prescription medicines only as told by your doctor.  Do not use any products that contain nicotine or tobacco, such as cigarettes, e-cigarettes, and chewing tobacco. If you need help quitting, ask your doctor.  Keep all follow-up  visits as told by your doctor. This is important. Contact a doctor if:  It has been more than one day since your procedure and you feel like you may vomit or you are vomiting.  You have a rash. Get help right away if:  You have a fever.  You have a headache that lasts a long time.  You have a very bad headache.  Your vision is blurry or you see two of a single object (double vision).  You are dizzy or light-headed.  You faint.  Your arms or legs tingle, feel weak, or get numb.  You have trouble breathing.  You cannot pee. These symptoms may be an emergency. Get help right away. Call your local emergency services (911 in the [place]  Do not wait to see if the symptoms will go away.  Do not drive yourself to the hospital. Summary  After the procedure, have a responsible adult stay with you at home.  Do not do activities that might cause you to get hurt.  Do not drive, use machinery, drink alcohol, or make important decisions as told by your doctor.  Do not use products that contain nicotine or tobacco.  Get help right away if you have a very bad headache, trouble breathing, or you cannot pee. This information is not intended to replace advice given to you by your health care provider. Make sure you discuss any questions you have with your health care provider. Document Revised: 08/26/2020  Document Reviewed: 01/29/2020 Elsevier Patient Education  Reader Anesthesia, Adult, Care After This sheet gives you information about how to care for yourself after your procedure. Your health care provider may also give you more specific instructions. If you have problems or questions, contact your health care provider. What can I expect after the procedure? After the procedure, the following side effects are common:  Pain or discomfort at the IV site.  Nausea.  Vomiting.  Sore throat.  Trouble concentrating.  Feeling cold or chills.  Feeling weak or  tired.  Sleepiness and fatigue.  Soreness and body aches. These side effects can affect parts of the body that were not involved in surgery. Follow these instructions at home: For the time period you were told by your health care provider:  Rest.  Do not participate in activities where you could fall or become injured.  Do not drive or use machinery.  Do not drink alcohol.  Do not take sleeping pills or medicines that cause drowsiness.  Do not make important decisions or sign legal documents.  Do not take care of children on your own.   Eating and drinking  Follow any instructions from your health care provider about eating or drinking restrictions.  When you feel hungry, start by eating small amounts of foods that are soft and easy to digest (bland), such as toast. Gradually return to your regular diet.  Drink enough fluid to keep your urine pale yellow.  If you vomit, rehydrate by drinking water, juice, or clear broth. General instructions  If you have sleep apnea, surgery and certain medicines can increase your risk for breathing problems. Follow instructions from your health care provider about wearing your sleep device: ? Anytime you are sleeping, including during daytime naps. ? While taking prescription pain medicines, sleeping medicines, or medicines that make you drowsy.  Have a responsible adult stay with you for the time you are told. It is important to have someone help care for you until you are awake and alert.  Return to your normal activities as told by your health care provider. Ask your health care provider what activities are safe for you.  Take over-the-counter and prescription medicines only as told by your health care provider.  If you smoke, do not smoke without supervision.  Keep all follow-up visits as told by your health care provider. This is important. Contact a health care provider if:  You have nausea or vomiting that does not get better  with medicine.  You cannot eat or drink without vomiting.  You have pain that does not get better with medicine.  You are unable to pass urine.  You develop a skin rash.  You have a fever.  You have redness around your IV site that gets worse. Get help right away if:  You have difficulty breathing.  You have chest pain.  You have blood in your urine or stool, or you vomit blood. Summary  After the procedure, it is common to have a sore throat or nausea. It is also common to feel tired.  Have a responsible adult stay with you for the time you are told. It is important to have someone help care for you until you are awake and alert.  When you feel hungry, start by eating small amounts of foods that are soft and easy to digest (bland), such as toast. Gradually return to your regular diet.  Drink enough fluid to keep your urine pale yellow.  Return to your normal activities as told by your health care provider. Ask your health care provider what activities are safe for you. This information is not intended to replace advice given to you by your health care provider. Make sure you discuss any questions you have with your health care provider. Document Revised: 08/26/2020 Document Reviewed: 03/25/2020 Elsevier Patient Education  2021 [employer].

## 2021-01-04 NOTE — Anesthesia Preprocedure Evaluation (Addendum)
Anesthesia Evaluation  Patient identified by MRN, date of birth, ID band Patient awake    Reviewed: Allergy & Precautions, NPO status , Patient's Chart, lab work & pertinent test results  Airway Mallampati: I  TM Distance: >3 FB Neck ROM: Full    Dental  (+) Edentulous Upper, Edentulous Lower   Pulmonary COPD,  COPD inhaler, Current Smoker,     + decreased breath sounds      Cardiovascular negative cardio ROS   Rhythm:Regular Rate:Normal     Neuro/Psych  Headaches, PSYCHIATRIC DISORDERS Anxiety Depression  Neuromuscular disease    GI/Hepatic negative GI ROS,   Endo/Other    Renal/GU      Musculoskeletal  (+) Arthritis , Fibromyalgia -  Abdominal Normal abdominal exam  (+)   Peds  Hematology   Anesthesia Other Findings   Reproductive/Obstetrics                            Anesthesia Physical Anesthesia Plan  ASA: III  Anesthesia Plan: Spinal   Post-op Pain Management:    Induction: Intravenous  PONV Risk Score and Plan: 2 and Ondansetron and Propofol infusion  Airway Management Planned: Natural Airway and Simple Face Mask  Additional Equipment: None  Intra-op Plan:   Post-operative Plan:   Informed Consent: I have reviewed the patients History and Physical, chart, labs and discussed the procedure including the risks, benefits and alternatives for the proposed anesthesia with the patient or authorized representative who has indicated his/her understanding and acceptance.       Plan Discussed with: CRNA  Anesthesia Plan Comments: (Lab Results      Component                Value               Date                      WBC                      6.6                 12/31/2020                HGB                      13.4                12/31/2020                HCT                      41.8                12/31/2020                MCV                      92.1                 12/31/2020                PLT                      381                 12/31/2020           )  Anesthesia Quick Evaluation  

## 2021-01-04 NOTE — Transfer of Care (Signed)
Immediate Anesthesia Transfer of Care Note  Patient: [name]  Procedure(s) Performed: RIGHT TOTAL HIP ARTHROPLASTY ANTERIOR APPROACH (Right Hip)  Patient Location: PACU  Anesthesia Type:Spinal  Level of Consciousness: awake  Airway & Oxygen Therapy: Patient Spontanous Breathing and Patient connected to face mask oxygen  Post-op Assessment: Report given to RN and Post -op Vital signs reviewed and stable  Post vital signs: Reviewed and stable  Last Vitals:  Vitals Value Taken Time  BP 109/59 01/04/21 1418  Temp    Pulse 70 01/04/21 1422  Resp 11 01/04/21 1422  SpO2 100 % 01/04/21 1422  Vitals shown include unvalidated device data.  Last Pain:  Vitals:   01/04/21 1108  TempSrc: Oral         Complications: No complications documented.

## 2021-01-04 NOTE — Progress Notes (Signed)
Pt called at 0815 day of surgery stating she was having abdominal pain and unable to make a "real bowel movement." States she is doubled over in pain, sweaty, and nauseous. Denies vomiting and states her stool looks like "goat poop." She questions coming in for the surgery and wants to know what to do.  Dr. Rhona Raider made aware and suggests patient come in for evaluation to determine steps for surgery. This nurse made patient aware and patient responded with  "seriously? I don't think I have a choice. I can't come to surgery." This nurse reiterated that Dr. Rhona Raider stated she could come in for surgery and be evaluated here prior to surgery. Pt was very back and forth on if she was coming in or not. Pt told to call us with a plan.

## 2021-01-04 NOTE — Interval H&P Note (Signed)
History and Physical Interval Note:  01/04/2021 12:13 PM  [name]  has presented today for surgery, with the diagnosis of RIGHT HIP DEGENERATIVE JOINT DISEASE.  The various methods of treatment have been discussed with the patient and family. After consideration of risks, benefits and other options for treatment, the patient has consented to  Procedure(s): RIGHT TOTAL HIP ARTHROPLASTY ANTERIOR APPROACH (Right) as a surgical intervention.  The patient's history has been reviewed, patient examined, no change in status, stable for surgery.  I have reviewed the patient's chart and labs.  Questions were answered to the patient's satisfaction.     Hessie Dibble

## 2021-01-04 NOTE — Anesthesia Procedure Notes (Signed)
Spinal  Patient location during procedure: OR Start time: 01/04/2021 1:23 PM End time: 01/04/2021 1:32 PM Staffing Performed: resident/CRNA  Resident/CRNA: Silas [place], CRNA Preanesthetic Checklist Completed: patient identified, IV checked, site marked, risks and benefits discussed, surgical consent, monitors and equipment checked, pre-op evaluation and timeout performed Spinal Block Patient position: sitting Prep: DuraPrep Patient monitoring: heart rate, cardiac monitor, continuous pulse ox and blood pressure Approach: midline Location: L3-4 Injection technique: single-shot Needle Needle type: Sprotte  Needle gauge: 24 G Needle length: 9 cm Assessment Sensory level: T8 Additional Notes IV functioning, monitors applied to pt. Expiration date of kit checked and confirmed to be in date. Sterile prep and drape, hand hygiene and sterile gloved used. Pt was positioned and spine was prepped in sterile fashion. Skin was anesthetized with lidocaine. Free flow of clear CSF obtained prior to injecting local anesthetic into CSF x 2 attempt. Spinal needle aspirated freely following injection. Needle was carefully withdrawn, and pt tolerated procedure well. Loss of motor and sensory on exam post injection.

## 2021-01-05 NOTE — Anesthesia Postprocedure Evaluation (Signed)
Anesthesia Post Note  Patient: [name]  Procedure(s) Performed: RIGHT TOTAL HIP ARTHROPLASTY ANTERIOR APPROACH (Right Hip)     Patient location during evaluation: PACU Anesthesia Type: Spinal Level of consciousness: oriented and awake and alert Pain management: pain level controlled Vital Signs Assessment: post-procedure vital signs reviewed and stable Respiratory status: spontaneous breathing, respiratory function stable and patient connected to nasal cannula oxygen Cardiovascular status: blood pressure returned to baseline and stable Postop Assessment: no headache, no backache and no apparent nausea or vomiting Anesthetic complications: no Comments: Case cancelled after spinal.    No complications documented.  Last Vitals:  Vitals:   01/04/21 1800 01/04/21 1900  BP: (!) 148/78 140/72  Pulse: 91 90  Resp: 16 16  Temp:  36.7 C  SpO2: 97% 98%    Last Pain:  Vitals:   01/04/21 1900  TempSrc:   PainSc: 0-No pain                 Effie Berkshire

## 2021-01-14 ENCOUNTER — Other Ambulatory Visit: Payer: Self-pay | Admitting: Orthopaedic Surgery

## 2021-01-14 DIAGNOSIS — Z01818 Encounter for other preprocedural examination: Secondary | ICD-10-CM

## 2021-01-31 NOTE — Patient Instructions (Addendum)
DUE TO COVID-19 ONLY ONE VISITOR IS ALLOWED TO COME WITH YOU AND STAY IN THE WAITING ROOM ONLY DURING PRE OP AND PROCEDURE DAY OF SURGERY. THE 1 VISITOR  MAY VISIT WITH YOU AFTER SURGERY IN YOUR PRIVATE ROOM DURING VISITING HOURS ONLY!  YOU NEED TO HAVE A COVID 19 TEST ON: 02/04/21 @  1:30 PM , THIS TEST MUST BE DONE BEFORE SURGERY,  COVID TESTING SITE [place] [address], IT IS ON THE RIGHT GOING OUT [address] APPROXIMATELY  2 MINUTES PAST [employer] ON THE RIGHT. ONCE YOUR COVID TEST IS COMPLETED,  PLEASE BEGIN THE QUARANTINE INSTRUCTIONS AS OUTLINED IN YOUR HANDOUT.                [name]    Your procedure is scheduled on: 02/08/21   Report to [facility] Main  Entrance   Report to admitting at : 12:30 PM     Call this number if you have problems the morning of surgery [phone]    Remember:  NO SOLID FOOD AFTER MIDNIGHT THE NIGHT PRIOR TO SURGERY. NOTHING BY MOUTH EXCEPT CLEAR LIQUIDS UNTIL: 12:00 PM . PLEASE FINISH ENSURE DRINK PER SURGEON ORDER  WHICH NEEDS TO BE COMPLETED AT: 12:00 PM .  CLEAR LIQUID DIET  Foods Allowed                                                                     Foods Excluded  Coffee and tea, regular and decaf                             liquids that you cannot  Plain Jell-O any favor except red or purple                                           see through such as: Fruit ices (not with fruit pulp)                                     milk, soups, orange juice  Iced Popsicles                                    All solid food Carbonated beverages, regular and diet                                    Cranberry, grape and apple juices Sports drinks like Gatorade Lightly seasoned clear broth or consume(fat free) Sugar, honey syrup  Sample Menu Breakfast                                Lunch  Supper Cranberry juice                    Beef broth                             Chicken broth Jell-O                                     Grape juice                           Apple juice Coffee or tea                        Jell-O                                      Popsicle                                                Coffee or tea                        Coffee or tea  _____________________________________________________________________   BRUSH YOUR TEETH MORNING OF SURGERY AND RINSE YOUR MOUTH OUT, NO CHEWING GUM CANDY OR MINTS.    Take these medicines the morning of surgery with A SIP OF WATER: Rimegepant,sumatriptan and linzess as needed.Use inhalers as usual.                              You may not have any metal on your body including hair pins and              piercings  Do not wear jewelry, make-up, lotions, powders or perfumes, deodorant             Do not wear nail polish on your fingernails.  Do not shave  48 hours prior to surgery.    Do not bring valuables to the hospital. [place].  Contacts, dentures or bridgework may not be worn into surgery.  Leave suitcase in the car. After surgery it may be brought to your room.     Patients discharged the day of surgery will not be allowed to drive home. IF YOU ARE HAVING SURGERY AND GOING HOME THE SAME DAY, YOU MUST HAVE AN ADULT TO DRIVE YOU HOME AND BE WITH YOU FOR 24 HOURS. YOU MAY GO HOME BY TAXI OR UBER OR ORTHERWISE, BUT AN ADULT MUST ACCOMPANY YOU HOME AND STAY WITH YOU FOR 24 HOURS.  Name and phone number of your driver:  Special Instructions: N/A              Please read over the following fact sheets you were given: _____________________________________________________________________         [facility] - Preparing for Surgery Before surgery, you can play an important role.  Because skin is not sterile, your skin needs to be as free of  germs as possible.  You can reduce the number of germs on your skin by washing with CHG (chlorahexidine  gluconate) soap before surgery.  CHG is an antiseptic cleaner which kills germs and bonds with the skin to continue killing germs even after washing. Please DO NOT use if you have an allergy to CHG or antibacterial soaps.  If your skin becomes reddened/irritated stop using the CHG and inform your nurse when you arrive at Short Stay. Do not shave (including legs and underarms) for at least 48 hours prior to the first CHG shower.  You may shave your face/neck. Please follow these instructions carefully:  1.  Shower with CHG Soap the night before surgery and the  morning of Surgery.  2.  If you choose to wash your hair, wash your hair first as usual with your  normal  shampoo.  3.  After you shampoo, rinse your hair and body thoroughly to remove the  shampoo.                           4.  Use CHG as you would any other liquid soap.  You can apply chg directly  to the skin and wash                       Gently with a scrungie or clean washcloth.  5.  Apply the CHG Soap to your body ONLY FROM THE NECK DOWN.   Do not use on face/ open                           Wound or open sores. Avoid contact with eyes, ears mouth and genitals (private parts).                       Wash face,  Genitals (private parts) with your normal soap.             6.  Wash thoroughly, paying special attention to the area where your surgery  will be performed.  7.  Thoroughly rinse your body with warm water from the neck down.  8.  DO NOT shower/wash with your normal soap after using and rinsing off  the CHG Soap.                9.  Pat yourself dry with a clean towel.            10.  Wear clean pajamas.            11.  Place clean sheets on your bed the night of your first shower and do not  sleep with pets. Day of Surgery : Do not apply any lotions/deodorants the morning of surgery.  Please wear clean clothes to the hospital/surgery center.  FAILURE TO FOLLOW THESE INSTRUCTIONS MAY RESULT IN THE CANCELLATION OF YOUR  SURGERY PATIENT SIGNATURE_________________________________  NURSE SIGNATURE__________________________________  ________________________________________________________________________   [name]  An incentive spirometer is a tool that can help keep your lungs clear and active. This tool measures how well you are filling your lungs with each breath. Taking long deep breaths may help reverse or decrease the chance of developing breathing (pulmonary) problems (especially infection) following:  A long period of time when you are unable to move or be active. BEFORE THE PROCEDURE   If the spirometer includes an indicator to show your best effort, your nurse  or respiratory therapist will set it to a desired goal.  If possible, sit up straight or lean slightly forward. Try not to slouch.  Hold the incentive spirometer in an upright position. INSTRUCTIONS FOR USE  1. Sit on the edge of your bed if possible, or sit up as far as you can in bed or on a chair. 2. Hold the incentive spirometer in an upright position. 3. Breathe out normally. 4. Place the mouthpiece in your mouth and seal your lips tightly around it. 5. Breathe in slowly and as deeply as possible, raising the piston or the ball toward the top of the column. 6. Hold your breath for 3-5 seconds or for as long as possible. Allow the piston or ball to fall to the bottom of the column. 7. Remove the mouthpiece from your mouth and breathe out normally. 8. Rest for a few seconds and repeat Steps 1 through 7 at least 10 times every 1-2 hours when you are awake. Take your time and take a few normal breaths between deep breaths. 9. The spirometer may include an indicator to show your best effort. Use the indicator as a goal to work toward during each repetition. 10. After each set of 10 deep breaths, practice coughing to be sure your lungs are clear. If you have an incision (the cut made at the time of surgery), support your  incision when coughing by placing a pillow or rolled up towels firmly against it. Once you are able to get out of bed, walk around indoors and cough well. You may stop using the incentive spirometer when instructed by your caregiver.  RISKS AND COMPLICATIONS  Take your time so you do not get dizzy or light-headed.  If you are in pain, you may need to take or ask for pain medication before doing incentive spirometry. It is harder to take a deep breath if you are having pain. AFTER USE  Rest and breathe slowly and easily.  It can be helpful to keep track of a log of your progress. Your caregiver can provide you with a simple table to help with this. If you are using the spirometer at home, follow these instructions: [place] IF:   You are having difficultly using the spirometer.  You have trouble using the spirometer as often as instructed.  Your pain medication is not giving enough relief while using the spirometer.  You develop fever of 100.5 F (38.1 C) or higher. SEEK IMMEDIATE MEDICAL CARE IF:   You cough up bloody sputum that had not been present before.  You develop fever of 102 F (38.9 C) or greater.  You develop worsening pain at or near the incision site. MAKE SURE YOU:   Understand these instructions.  Will watch your condition.  Will get help right away if you are not doing well or get worse. Document Released: 04/23/2007 Document Revised: 03/04/2012 Document Reviewed: 06/24/2007 [facility] Patient Information 2014 [place], [place].   ________________________________________________________________________

## 2021-02-01 ENCOUNTER — Encounter (HOSPITAL_COMMUNITY)
Admission: RE | Admit: 2021-02-01 | Discharge: 2021-02-01 | Disposition: A | Discharge status: Home / Self Care | Payer: Medicare HMO | Source: Ambulatory Visit | Attending: Orthopaedic Surgery | Admitting: Orthopaedic Surgery

## 2021-02-01 ENCOUNTER — Encounter (HOSPITAL_COMMUNITY): Payer: Self-pay

## 2021-02-01 ENCOUNTER — Other Ambulatory Visit: Payer: Self-pay

## 2021-02-01 ENCOUNTER — Telehealth: Payer: Self-pay | Admitting: Pulmonary Disease

## 2021-02-01 DIAGNOSIS — Z01812 Encounter for preprocedural laboratory examination: Secondary | ICD-10-CM | POA: Diagnosis not present

## 2021-02-01 LAB — BASIC METABOLIC PANEL
Anion gap: 9 (ref 5–15)
BUN: 19 mg/dL (ref 8–23)
CO2: 27 mmol/L (ref 22–32)
Calcium: 9.3 mg/dL (ref 8.9–10.3)
Chloride: 105 mmol/L (ref 98–111)
Creatinine, Ser: 0.81 mg/dL (ref 0.44–1.00)
GFR, Estimated: >60 mL/min (ref >60)
Glucose, Bld: 92 mg/dL (ref 70–99)
Potassium: 4.1 mmol/L (ref 3.5–5.1)
Sodium: 141 mmol/L (ref 135–145)

## 2021-02-01 LAB — URINALYSIS, ROUTINE W REFLEX MICROSCOPIC
Bilirubin Urine: NEGATIVE
Glucose, UA: NEGATIVE mg/dL
Hgb urine dipstick: NEGATIVE
Ketones, ur: NEGATIVE mg/dL
Leukocytes,Ua: NEGATIVE
Nitrite: NEGATIVE
Protein, ur: NEGATIVE mg/dL
Specific Gravity, Urine: 1.02 (ref 1.005–1.030)
pH: 6 (ref 5.0–8.0)

## 2021-02-01 LAB — CBC WITH DIFFERENTIAL/PLATELET
Abs Immature Granulocytes: 0.09 10*3/uL — ABNORMAL HIGH (ref 0.00–0.07)
Basophils Absolute: 0.1 10*3/uL (ref 0.0–0.1)
Basophils Relative: 1 %
Eosinophils Absolute: 0.1 10*3/uL (ref 0.0–0.5)
Eosinophils Relative: 1 %
HCT: 42.5 % (ref 36.0–46.0)
Hemoglobin: 13.6 g/dL (ref 12.0–15.0)
Immature Granulocytes: 1 %
Lymphocytes Relative: 26 %
Lymphs Abs: 3.2 10*3/uL (ref 0.7–4.0)
MCH: 30.1 pg (ref 26.0–34.0)
MCHC: 32 g/dL (ref 30.0–36.0)
MCV: 94 fL (ref 80.0–100.0)
Monocytes Absolute: 0.7 10*3/uL (ref 0.1–1.0)
Monocytes Relative: 6 %
Neutro Abs: 8.4 10*3/uL — ABNORMAL HIGH (ref 1.7–7.7)
Neutrophils Relative %: 65 %
Platelets: 385 10*3/uL (ref 150–400)
RBC: 4.52 MIL/uL (ref 3.87–5.11)
RDW: 15.6 % — ABNORMAL HIGH (ref 11.5–15.5)
WBC: 12.6 10*3/uL — ABNORMAL HIGH (ref 4.0–10.5)
nRBC: 0 % (ref 0.0–0.2)

## 2021-02-01 LAB — PROTIME-INR
INR: 0.9 (ref 0.8–1.2)
Prothrombin Time: 12.2 seconds (ref 11.4–15.2)

## 2021-02-01 LAB — SURGICAL PCR SCREEN
MRSA, PCR: NEGATIVE
Staphylococcus aureus: NEGATIVE

## 2021-02-01 LAB — APTT: aPTT: 29 seconds (ref 24–36)

## 2021-02-01 NOTE — Telephone Encounter (Signed)
She needs a televisit to see how she is. She was cleared for surgery when she was healthy.  Please schedule with either a doc or NP who has availability.  Thanks

## 2021-02-01 NOTE — Telephone Encounter (Signed)
Primary Pulmonologist: Elsworth Soho Last office visit and with whom: 12/29/20 with SG What do we see them for (pulmonary problems): COPD Last OV assessment/plan: Assessment/Plan  Surgical Clearance for hip replacement Stable interval COPD Risk of complications with your underlying disease is < 0.5% Plan We will clear you for surgery with noted recommendations. We will give you your pneumonia  shot today. We will fax clearance form to your orthopedist and send in today's clearance note  COPD Plan Continue Symbicort as you have been doing Rinse mouth after use. Use your rescue inhaler as needed.  Please work on quitting smoking  We will give you a card to get free nicotine patches.  Lung Cancer screening due 06/2021 Follow up with Dr. Elsworth Soho / Eustaquio Maize NP  or Judson Roch NP as needed Note your daily symptoms >remember "red flags" for COPD: Increase in cough, increase in sputum production, increase in shortness of breath or activity intolerance. If you notice these symptoms, please call to be seen.  Please contact office for sooner follow up if symptoms do not improve or worsen or seek emergency care  We will refill your Symbicort today  Tobacco Abuse Seen by [place] on need to quit completely Be stronger than your excuses card given for free nicotine patches. Risks of continued smoking reviewed Next LDCT due 06/2021  Was appointment offered to patient (explain)?  Pt wants abx if possible   Reason for call: Called and spoke with pt who stated she began coughing yesterday 2/7 coughing up clear phlegm. Pt states that she is wheezing. Pt denies any complaints of fever, has not checked temp, but does not feel feverish.  Pt is using her symbicort inhaler as prescribed and taking all her other meds as prescribed. Pt has not had to use her rescue inhaler any.  Pt is scheduled to get a covid test done today 2/8 which is needing to be done due to her having hip surgery  2/14.  Due to pt about to have surgery and with her symptoms that she has developed, pt is wondering if meds could be prescribed to help with her symptoms.  Sarah, please advise.   Allergies  Allergen Reactions  . Codeine Nausea Only  . Topamax [Topiramate] Hives  . Lyrica [Pregabalin] Swelling    Immunization History  Administered Date(s) Administered  . Influenza,inj,Quad PF,6+ Mos 11/29/2015, 02/09/2020  . Pneumococcal Polysaccharide-23 12/29/2020  . Tdap 10/23/2012

## 2021-02-01 NOTE — Progress Notes (Unsigned)
02/02/21- Virtual Visit via Telephone Note  I connected with [name] on 02/01/21 at 11:00 AM EST by telephone and verified that I am speaking with the correct person using two identifiers.  Location: Patient: H Provider: O   I discussed the limitations, risks, security and privacy concerns of performing an evaluation and management service by telephone and the availability of in person appointments. I also discussed with the patient that there may be a patient responsible charge related to this service. The patient expressed understanding and agreed to proceed.   History of Present Illness: 61 yoF Smoker followed by Dr Elsworth Soho for COPD.R  hip replacement (Dr Rhona Raider) 01/04/21 was aborted when she got nauseated after spinal block. Now rescheduled later this month. . Surgical clearance 02/01/21  Medical problem list includes Migraine, COPD mixed type, Cervical spondylosis, Hx Cervical Cancer, Tobacco Abuse, Anxiety/Depression,  Phoned 2/8 reporting acute bronchitis and requested antibiotic- apparently deferred till phone visit with me. Symbicort 160, Ventolin hfa,  Covid vax-1 Phizer Flu vax-not yet She reports onset 2 days ago or URI symptoms felt as bronchitis with mild sore throat, tight chest , increased wheeze, cough productive white, litte/ no fever. Had been isolating with no known exposures. Says husband works at place with strict covid precautions and is not ill. We discussed symptomatic management as probable viral syndrome, una ble to completely exclude Covid.    Observations/Objective: CXR 12/31/20-  FINDINGS: The heart, hila, and mediastinum are normal. No pneumothorax. No nodules or masses. No focal infiltrates. IMPRESSION: No active cardiopulmonary disease.  PFT 01/28/20- FEV1/FVC 0.67, no response to dilator  Assessment and Plan: COPD exacerbation  Follow Up Instructions:    I discussed the assessment and treatment plan with the patient. The patient was provided an  opportunity to ask questions and all were answered. The patient agreed with the plan and demonstrated an understanding of the instructions.   The patient was advised to call back or seek an in-person evaluation if the symptoms worsen or if the condition fails to improve as anticipated.  I provided 15 minutes of non-face-to-face time during this encounter.   Baird Lyons, MD

## 2021-02-01 NOTE — Telephone Encounter (Signed)
Called and spoke with pt letting her know the info stated by SG and she verbalized understanding. Pt has been scheduled an acute phone visit with Dr. Annamaria Boots tomorrow at 11am. Nothing further needed.

## 2021-02-01 NOTE — Progress Notes (Addendum)
COVID Vaccine Completed: !st. dose Date COVID Vaccine completed:07/2020.  COVID vaccine manufacturer: Pfizer     PCP - Dr. Claris Gower. Cardiologist -  Pulmonologist: Clearance: Eric Form: NP.: 12/29/20 Chest x-ray - 12/31/20 EKG - 12/31/20 Stress Test -  ECHO -  Cardiac Cath -  Pacemaker/ICD device last checked:  Sleep Study -  CPAP -   Fasting Blood Sugar -  Checks Blood Sugar _____ times a day  Blood Thinner Instructions: Aspirin Instructions: Last Dose:  Anesthesia review: Hx: COPD,SMOKER  Patient denies shortness of breath, fever, cough and chest pain at PAT appointment   Patient verbalized understanding of instructions that were given to them at the PAT appointment. Patient was also instructed that they will need to review over the PAT instructions again at home before surgery.

## 2021-02-02 ENCOUNTER — Encounter: Payer: Self-pay | Admitting: Internal Medicine

## 2021-02-02 ENCOUNTER — Ambulatory Visit (INDEPENDENT_AMBULATORY_CARE_PROVIDER_SITE_OTHER): Payer: Medicare HMO | Admitting: Internal Medicine

## 2021-02-02 DIAGNOSIS — J441 Chronic obstructive pulmonary disease with (acute) exacerbation: Secondary | ICD-10-CM | POA: Insufficient documentation

## 2021-02-02 DIAGNOSIS — Z720 Tobacco use: Secondary | ICD-10-CM

## 2021-02-02 MED ORDER — AZITHROMYCIN 250 MG PO TABS: ORAL_TABLET | ORAL | 0 refills | Status: DC

## 2021-02-02 NOTE — Patient Instructions (Signed)
Script for Zpak sent to [facility]  Appointment made to follow up with Dr Melvyn Novas for your breathing problems in 6 months, to give you time to get up and going again after your knee surgery. Please call us if you need help before then.  I hope the knee surgery goes well.

## 2021-02-02 NOTE — Assessment & Plan Note (Signed)
She is aware this is strongly adverse to her well-being. Not prepared to stop now.  Plan- ongoing support and counseling as she follows here.

## 2021-02-02 NOTE — Assessment & Plan Note (Signed)
Underlying Chronic respiratory failure, ongoing tobacco abuse. Knee surgery already rescheduled once. Appropriate to continue self isolation at home for now. If she gets worse she will need retesting for Covid. Sounds very mild over phone. Plan- symptomatic management. Did agree to send Zpak.

## 2021-02-04 ENCOUNTER — Other Ambulatory Visit (HOSPITAL_COMMUNITY)
Admission: RE | Admit: 2021-02-04 | Discharge: 2021-02-04 | Disposition: A | Discharge status: Home / Self Care | Payer: Medicare HMO | Source: Ambulatory Visit | Attending: Orthopaedic Surgery | Admitting: Orthopaedic Surgery

## 2021-02-04 DIAGNOSIS — Z20822 Contact with and (suspected) exposure to covid-19: Secondary | ICD-10-CM | POA: Diagnosis not present

## 2021-02-04 DIAGNOSIS — Z01812 Encounter for preprocedural laboratory examination: Secondary | ICD-10-CM | POA: Diagnosis present

## 2021-02-04 LAB — SARS CORONAVIRUS 2 (TAT 6-24 HRS): SARS Coronavirus 2: NEGATIVE

## 2021-02-04 NOTE — H&P (Signed)
TOTAL HIP ADMISSION H&P  Patient is admitted for right total hip arthroplasty.  Subjective:  Chief Complaint: right hip pain  HPI: [name], 60 y.o. female, has a history of pain and functional disability in the right hip(s) due to arthritis and patient has failed non-surgical conservative treatments for greater than 12 weeks to include NSAID's and/or analgesics, corticosteriod injections, flexibility and strengthening excercises, use of assistive devices, weight reduction as appropriate and activity modification.  Onset of symptoms was gradual starting 5 years ago with gradually worsening course since that time.The patient noted no past surgery on the right hip(s).  Patient currently rates pain in the right hip at 10 out of 10 with activity. Patient has night pain, worsening of pain with activity and weight bearing, trendelenberg gait, pain that interfers with activities of daily living and crepitus. Patient has evidence of subchondral cysts, subchondral sclerosis, periarticular osteophytes and joint space narrowing by imaging studies. This condition presents safety issues increasing the risk of falls. There is no current active infection.  Patient Active Problem List   Diagnosis Date Noted  . COPD with acute exacerbation ([place]) 02/02/2021  . Abnormal screening CT of chest 02/09/2020  . Healthcare maintenance 02/09/2020  . COPD with chronic bronchitis and emphysema ([place]) 10/28/2019  . Tobacco abuse 10/28/2019  . Cervical spondylosis without myelopathy 08/04/2013  . Migraine without aura, with intractable migraine, so stated, without mention of status migrainosus 03/13/2013   Past Medical History:  Diagnosis Date  . Anxiety   . Arthritis   . Cancer ([place])   . Cancer of cervix ([place])   . Cervical spondylosis without myelopathy 08/04/2013  . COPD (chronic obstructive pulmonary disease) ([place])   . Depression   . Dyspnea    on exertion  . Fibromyalgia   . Migraine without aura, with  intractable migraine, so stated, without mention of status migrainosus 12/2020  . Osteoporosis     Past Surgical History:  Procedure Laterality Date  . ABDOMINAL HYSTERECTOMY    . CESAREAN SECTION    . LUMBAR LAMINECTOMY    . NECK SURGERY    . SPINE SURGERY    . TUBAL LIGATION    . ULNAR NERVE TRANSPOSITION Left     No current facility-administered medications for this encounter.   Current Outpatient Medications  Medication Sig Dispense Refill Last Dose  . albuterol (VENTOLIN HFA) 108 (90 Base) MCG/ACT inhaler Inhale 1-2 puffs into the lungs every 6 (six) hours as needed for wheezing or shortness of breath.     . Aspirin-Acetaminophen-Caffeine (GOODY HEADACHE PO) Take 1 packet by mouth daily as needed (headaches).     . budesonide-formoterol (SYMBICORT) 160-4.5 MCG/ACT inhaler Inhale 2 puffs into the lungs 2 (two) times daily. 1 each 3   . butalbital-acetaminophen-caffeine (FIORICET) 50-325-40 MG tablet Take 1 tablet by mouth 2 (two) times daily as needed for headache or migraine.     . diclofenac (VOLTAREN) 75 MG EC tablet Take 75 mg by mouth 2 (two) times daily.     Marland Kitchen docusate sodium (COLACE) 100 MG capsule Take 100 mg by mouth daily as needed for mild constipation. (Patient not taking: Reported on 02/02/2021)     . Erenumab-aooe (AIMOVIG, 140 MG DOSE, [place]) Inject 140 mg into the skin every 30 (thirty) days.      Marland Kitchen lamoTRIgine (LAMICTAL) 100 MG tablet Take 100 mg by mouth 2 (two) times daily.      Marland Kitchen LINZESS 145 MCG CAPS capsule Take 145 mcg by mouth daily  as needed for constipation.     . nortriptyline (PAMELOR) 75 MG capsule Take 75-150 mg by mouth at bedtime.      . ondansetron (ZOFRAN) 4 MG tablet Take 4 mg by mouth every 8 (eight) hours as needed for nausea or vomiting.     . Oxycodone HCl 10 MG TABS Take 10 mg by mouth 4 (four) times daily as needed (pain). (Patient not taking: Reported on 02/02/2021)     . pantoprazole (PROTONIX) 40 MG tablet Take 40 mg by mouth daily.     .  Rimegepant Sulfate (NURTEC) 75 MG TBDP Take 75 mg by mouth daily as needed (migraine).     . SUMAtriptan (IMITREX) 100 MG tablet TAKE 1 TABLET (100 MG TOTAL) BY MOUTH AS NEEDED FOR MIGRAINE. (Patient taking differently: Take 50 mg by mouth every 2 (two) hours as needed for migraine.) 9 tablet 0   . tiZANidine (ZANAFLEX) 4 MG tablet Take 1 tablet (4 mg total) by mouth 3 (three) times daily. (Patient taking differently: Take 4 mg by mouth 2 (two) times daily.) 90 tablet 0   . atorvastatin (LIPITOR) 10 MG tablet Take 10 mg by mouth daily.     Marland Kitchen azithromycin (ZITHROMAX) 250 MG tablet 2 today then one daily 6 tablet 0   . HYDROmorphone (DILAUDID) 8 MG tablet Take 10 mg by mouth 4 (four) times daily as needed for severe pain.     . mirtazapine (REMERON) 15 MG tablet Take 15 mg by mouth at bedtime. (Patient not taking: Reported on 02/02/2021)     . Respiratory Therapy Supplies (FLUTTER) DEVI Use as directed. 1 each 0    Allergies  Allergen Reactions  . Codeine Nausea Only  . Topamax [Topiramate] Hives  . Lyrica [Pregabalin] Swelling    Social History   Tobacco Use  . Smoking status: Current Every Day Smoker    Packs/day: 0.25    Years: 46.00    Pack years: 11.50    Types: Cigarettes  . Smokeless tobacco: Never Used  . Tobacco comment: only puffs, does not inhale  Substance Use Topics  . Alcohol use: Yes    Comment: occas.    Family History  Problem Relation Age of Onset  . Cancer Father   . Cancer Mother      Review of Systems  Musculoskeletal: Positive for arthralgias.       Right hip  All other systems reviewed and are negative.   Objective:  Physical Exam Constitutional:      Appearance: Normal appearance.  HENT:     Head: Normocephalic and atraumatic.     Nose: Nose normal.     Mouth/Throat:     Pharynx: Oropharynx is clear.  Eyes:     Extraocular Movements: Extraocular movements intact.  Cardiovascular:     Rate and Rhythm: Normal rate.  Pulmonary:     Effort:  Pulmonary effort is normal.  Abdominal:     Palpations: Abdomen is soft.  Musculoskeletal:     Cervical back: Normal range of motion.     Comments: Examination right hip shows extremely painful internal range of motion.  Leg lengths are roughly equal.  She walks with a altered gait.  Normal sensation and motor function throughout both lower extremities.  She is neurovascularly intact distally bilaterally.    Skin:    General: Skin is warm and dry.  Neurological:     General: No focal deficit present.     Mental Status: She is alert and oriented  to person, place, and time.  Psychiatric:        Mood and Affect: Mood normal.        Behavior: Behavior normal.        Thought Content: Thought content normal.        Judgment: Judgment normal.     Vital signs in last 24 hours:    Labs:   Estimated body mass index is 21.97 kg/m as calculated from the following:   Height as of 02/01/21: 5\' 4"  (1.626 m).   Weight as of 12/29/20: 58.1 kg.   Imaging Review Plain radiographs demonstrate severe degenerative joint disease of the right hip(s). The bone quality appears to be good for age and reported activity level.      Assessment/Plan:  End stage primary arthritis, right hip(s)  The patient history, physical examination, clinical judgement of the provider and imaging studies are consistent with end stage degenerative joint disease of the right hip(s) and total hip arthroplasty is deemed medically necessary. The treatment options including medical management, injection therapy, arthroscopy and arthroplasty were discussed at length. The risks and benefits of total hip arthroplasty were presented and reviewed. The risks due to aseptic loosening, infection, stiffness, dislocation/subluxation,  thromboembolic complications and other imponderables were discussed.  The patient acknowledged the explanation, agreed to proceed with the plan and consent was signed. Patient is being admitted for inpatient  treatment for surgery, pain control, PT, OT, prophylactic antibiotics, VTE prophylaxis, progressive ambulation and ADL's and discharge planning.The patient is planning to be discharged home with home health services

## 2021-02-07 MED ORDER — TRANEXAMIC ACID 1000 MG/10ML IV SOLN
2000.0000 mg | INTRAVENOUS | Status: DC
  Filled 2021-02-07: qty 20

## 2021-02-07 MED ORDER — BUPIVACAINE LIPOSOME 1.3 % IJ SUSP
10.0000 mL | INTRAMUSCULAR | Status: DC
  Filled 2021-02-07: qty 10

## 2021-02-08 ENCOUNTER — Ambulatory Visit (HOSPITAL_COMMUNITY): Payer: Medicare HMO

## 2021-02-08 ENCOUNTER — Encounter (HOSPITAL_COMMUNITY): Payer: Self-pay | Admitting: Orthopaedic Surgery

## 2021-02-08 ENCOUNTER — Observation Stay (HOSPITAL_COMMUNITY)
Admission: RE | Admit: 2021-02-08 | Discharge: 2021-02-08 | Disposition: A | Discharge status: Home / Self Care | Payer: Medicare HMO | Source: Ambulatory Visit | Attending: Orthopaedic Surgery | Admitting: Orthopaedic Surgery

## 2021-02-08 ENCOUNTER — Encounter (HOSPITAL_COMMUNITY)
Admission: RE | Disposition: A | Discharge status: Home / Self Care | Payer: Self-pay | Source: Ambulatory Visit | Attending: Orthopaedic Surgery

## 2021-02-08 ENCOUNTER — Ambulatory Visit (HOSPITAL_COMMUNITY): Payer: Medicare HMO | Admitting: Anesthesiology

## 2021-02-08 DIAGNOSIS — F17210 Nicotine dependence, cigarettes, uncomplicated: Secondary | ICD-10-CM | POA: Insufficient documentation

## 2021-02-08 DIAGNOSIS — M25551 Pain in right hip: Secondary | ICD-10-CM | POA: Diagnosis present

## 2021-02-08 DIAGNOSIS — Z7982 Long term (current) use of aspirin: Secondary | ICD-10-CM | POA: Diagnosis not present

## 2021-02-08 DIAGNOSIS — Z419 Encounter for procedure for purposes other than remedying health state, unspecified: Secondary | ICD-10-CM

## 2021-02-08 DIAGNOSIS — J449 Chronic obstructive pulmonary disease, unspecified: Secondary | ICD-10-CM | POA: Insufficient documentation

## 2021-02-08 DIAGNOSIS — Z01818 Encounter for other preprocedural examination: Secondary | ICD-10-CM

## 2021-02-08 DIAGNOSIS — Z8541 Personal history of malignant neoplasm of cervix uteri: Secondary | ICD-10-CM | POA: Insufficient documentation

## 2021-02-08 DIAGNOSIS — M1611 Unilateral primary osteoarthritis, right hip: Principal | ICD-10-CM | POA: Diagnosis present

## 2021-02-08 HISTORY — PX: TOTAL HIP ARTHROPLASTY: SHX124

## 2021-02-08 LAB — TYPE AND SCREEN
ABO/RH(D): A POS
Antibody Screen: NEGATIVE

## 2021-02-08 SURGERY — ARTHROPLASTY, HIP, TOTAL, ANTERIOR APPROACH
Anesthesia: Spinal | Site: Hip | Laterality: Right

## 2021-02-08 MED ORDER — KETOROLAC TROMETHAMINE 15 MG/ML IJ SOLN: 15.0000 mg | Freq: Four times a day (QID) | INTRAMUSCULAR | Status: DC

## 2021-02-08 MED ORDER — OXYCODONE HCL 5 MG PO TABS
5.0000 mg | ORAL_TABLET | Freq: Once | ORAL | Status: AC | PRN
  Administered 2021-02-08: 5 mg via ORAL

## 2021-02-08 MED ORDER — BUPIVACAINE IN DEXTROSE 0.75-8.25 % IT SOLN
INTRATHECAL | Status: DC | PRN
  Administered 2021-02-08: 1.8 mL via INTRATHECAL

## 2021-02-08 MED ORDER — FENTANYL CITRATE (PF) 100 MCG/2ML IJ SOLN
25.0000 ug | INTRAMUSCULAR | Status: DC | PRN
Start: 2021-02-08 — End: 2021-02-09
  Administered 2021-02-08 (×3): 50 ug via INTRAVENOUS

## 2021-02-08 MED ORDER — LACTATED RINGERS IV SOLN: INTRAVENOUS | Status: DC

## 2021-02-08 MED ORDER — METHOCARBAMOL 500 MG PO TABS: 500.0000 mg | ORAL_TABLET | Freq: Four times a day (QID) | ORAL | Status: DC | PRN

## 2021-02-08 MED ORDER — STERILE WATER FOR IRRIGATION IR SOLN
Status: DC | PRN
  Administered 2021-02-08: 2000 mL

## 2021-02-08 MED ORDER — LACTATED RINGERS IV BOLUS
250.0000 mL | Freq: Once | INTRAVENOUS | Status: AC
  Administered 2021-02-08: 250 mL via INTRAVENOUS

## 2021-02-08 MED ORDER — FENTANYL CITRATE (PF) 100 MCG/2ML IJ SOLN
INTRAMUSCULAR | Status: AC
  Filled 2021-02-08: qty 4

## 2021-02-08 MED ORDER — 0.9 % SODIUM CHLORIDE (POUR BTL) OPTIME
TOPICAL | Status: DC | PRN
  Administered 2021-02-08: 1000 mL

## 2021-02-08 MED ORDER — OXYCODONE HCL 5 MG/5ML PO SOLN: 5.0000 mg | Freq: Once | ORAL | Status: AC | PRN

## 2021-02-08 MED ORDER — TRANEXAMIC ACID-NACL 1000-0.7 MG/100ML-% IV SOLN: 1000.0000 mg | Freq: Once | INTRAVENOUS | Status: DC

## 2021-02-08 MED ORDER — BUPIVACAINE-EPINEPHRINE 0.25% -1:200000 IJ SOLN
INTRAMUSCULAR | Status: DC | PRN
  Administered 2021-02-08: 20 mL

## 2021-02-08 MED ORDER — BUPIVACAINE-EPINEPHRINE (PF) 0.25% -1:200000 IJ SOLN
INTRAMUSCULAR | Status: AC
  Filled 2021-02-08: qty 30

## 2021-02-08 MED ORDER — DEXAMETHASONE SODIUM PHOSPHATE 10 MG/ML IJ SOLN
INTRAMUSCULAR | Status: DC | PRN
  Administered 2021-02-08: 8 mg via INTRAVENOUS

## 2021-02-08 MED ORDER — OXYCODONE HCL 5 MG PO TABS
ORAL_TABLET | ORAL | Status: AC
  Filled 2021-02-08: qty 1

## 2021-02-08 MED ORDER — LACTATED RINGERS IV BOLUS: 250.0000 mL | Freq: Once | INTRAVENOUS | Status: DC

## 2021-02-08 MED ORDER — BUPIVACAINE-MELOXICAM ER 400-12 MG/14ML IJ SOLN
INTRAMUSCULAR | Status: AC
  Filled 2021-02-08: qty 1

## 2021-02-08 MED ORDER — TRANEXAMIC ACID 1000 MG/10ML IV SOLN
INTRAVENOUS | Status: DC | PRN
  Administered 2021-02-08: 2000 mg via TOPICAL

## 2021-02-08 MED ORDER — TRANEXAMIC ACID-NACL 1000-0.7 MG/100ML-% IV SOLN
1000.0000 mg | INTRAVENOUS | Status: AC
  Administered 2021-02-08: 1000 mg via INTRAVENOUS
  Filled 2021-02-08: qty 100

## 2021-02-08 MED ORDER — SUMATRIPTAN SUCCINATE 50 MG PO TABS
100.0000 mg | ORAL_TABLET | Freq: Once | ORAL | Status: AC
  Administered 2021-02-08: 100 mg via ORAL
  Filled 2021-02-08: qty 2

## 2021-02-08 MED ORDER — KETAMINE HCL 10 MG/ML IJ SOLN
INTRAMUSCULAR | Status: AC
  Filled 2021-02-08: qty 1

## 2021-02-08 MED ORDER — ONDANSETRON HCL 4 MG/2ML IJ SOLN
INTRAMUSCULAR | Status: DC | PRN
  Administered 2021-02-08: 4 mg via INTRAVENOUS

## 2021-02-08 MED ORDER — HYDROMORPHONE HCL 1 MG/ML IJ SOLN
INTRAMUSCULAR | Status: DC | PRN
  Administered 2021-02-08 (×2): .5 mg via INTRAVENOUS

## 2021-02-08 MED ORDER — POVIDONE-IODINE 10 % EX SWAB
2.0000 "application " | Freq: Once | CUTANEOUS | Status: AC
  Administered 2021-02-08: 2 via TOPICAL

## 2021-02-08 MED ORDER — CEFAZOLIN SODIUM-DEXTROSE 2-4 GM/100ML-% IV SOLN: 2.0000 g | Freq: Four times a day (QID) | INTRAVENOUS | Status: DC

## 2021-02-08 MED ORDER — ORAL CARE MOUTH RINSE: 15.0000 mL | Freq: Once | OROMUCOSAL | Status: AC

## 2021-02-08 MED ORDER — PROPOFOL 10 MG/ML IV BOLUS
INTRAVENOUS | Status: DC | PRN
  Administered 2021-02-08 (×2): 20 mg via INTRAVENOUS

## 2021-02-08 MED ORDER — BUPIVACAINE-MELOXICAM ER 400-12 MG/14ML IJ SOLN
400.0000 mg | Freq: Once | INTRAMUSCULAR | Status: AC
  Administered 2021-02-08: 400 mg
  Filled 2021-02-08: qty 1

## 2021-02-08 MED ORDER — ASPIRIN EC 81 MG PO TBEC: 81.0000 mg | DELAYED_RELEASE_TABLET | Freq: Every day | ORAL | 0 refills | Status: AC

## 2021-02-08 MED ORDER — PROMETHAZINE HCL 25 MG/ML IJ SOLN: 6.2500 mg | INTRAMUSCULAR | Status: DC | PRN

## 2021-02-08 MED ORDER — METHOCARBAMOL 500 MG IVPB - SIMPLE MED
500.0000 mg | Freq: Four times a day (QID) | INTRAVENOUS | Status: DC | PRN
  Administered 2021-02-08: 500 mg via INTRAVENOUS

## 2021-02-08 MED ORDER — METHOCARBAMOL 500 MG IVPB - SIMPLE MED
INTRAVENOUS | Status: AC
  Filled 2021-02-08: qty 50

## 2021-02-08 MED ORDER — OXYCODONE HCL 5 MG PO TABS
ORAL_TABLET | ORAL | Status: AC
  Filled 2021-02-08: qty 2

## 2021-02-08 MED ORDER — KETAMINE HCL 10 MG/ML IJ SOLN
INTRAMUSCULAR | Status: DC | PRN
  Administered 2021-02-08: 30 mg via INTRAVENOUS

## 2021-02-08 MED ORDER — LACTATED RINGERS IV BOLUS
500.0000 mL | Freq: Once | INTRAVENOUS | Status: AC
  Administered 2021-02-08: 500 mL via INTRAVENOUS

## 2021-02-08 MED ORDER — HYDROMORPHONE HCL 2 MG/ML IJ SOLN
INTRAMUSCULAR | Status: AC
  Filled 2021-02-08: qty 1

## 2021-02-08 MED ORDER — OXYCODONE HCL 5 MG PO TABS
5.0000 mg | ORAL_TABLET | ORAL | Status: DC | PRN
  Administered 2021-02-08: 10 mg via ORAL

## 2021-02-08 MED ORDER — PROPOFOL 500 MG/50ML IV EMUL
INTRAVENOUS | Status: DC | PRN
  Administered 2021-02-08: 75 ug/kg/min via INTRAVENOUS

## 2021-02-08 MED ORDER — CEFAZOLIN SODIUM-DEXTROSE 2-4 GM/100ML-% IV SOLN
2.0000 g | INTRAVENOUS | Status: AC
  Administered 2021-02-08: 2 g via INTRAVENOUS
  Filled 2021-02-08: qty 100

## 2021-02-08 MED ORDER — CHLORHEXIDINE GLUCONATE 0.12 % MT SOLN
15.0000 mL | Freq: Once | OROMUCOSAL | Status: AC
  Administered 2021-02-08: 15 mL via OROMUCOSAL

## 2021-02-08 SURGICAL SUPPLY — 44 items
BAG DECANTER FOR FLEXI CONT (MISCELLANEOUS) ×2 IMPLANT
BALL HIP CERAMIC (Hips) ×1 IMPLANT
BLADE SAW SGTL 18X1.27X75 (BLADE) ×2 IMPLANT
BOOTIES KNEE HIGH SLOAN (MISCELLANEOUS) ×2 IMPLANT
CELLS DAT CNTRL 66122 CELL SVR (MISCELLANEOUS) ×1 IMPLANT
COVER PERINEAL POST (MISCELLANEOUS) ×2 IMPLANT
COVER SURGICAL LIGHT HANDLE (MISCELLANEOUS) ×2 IMPLANT
COVER WAND RF STERILE (DRAPES) IMPLANT
CUP GRIPTON 48MM 100 HIP (Hips) ×2 IMPLANT
DECANTER SPIKE VIAL GLASS SM (MISCELLANEOUS) ×2 IMPLANT
DRAPE IMP U-DRAPE 54X76 (DRAPES) ×2 IMPLANT
DRAPE ORTHO SPLIT 77X108 STRL (DRAPES)
DRAPE STERI IOBAN 125X83 (DRAPES) ×2 IMPLANT
DRAPE SURG ORHT 6 SPLT 77X108 (DRAPES) IMPLANT
DRAPE U-SHAPE 47X51 STRL (DRAPES) ×4 IMPLANT
DRSG AQUACEL AG ADV 3.5X 6 (GAUZE/BANDAGES/DRESSINGS) ×2 IMPLANT
DURAPREP 26ML APPLICATOR (WOUND CARE) ×2 IMPLANT
ELECT BLADE TIP CTD 4 INCH (ELECTRODE) ×2 IMPLANT
ELECT REM PT RETURN 15FT ADLT (MISCELLANEOUS) ×2 IMPLANT
ELIMINATOR HOLE APEX DEPUY (Hips) ×2 IMPLANT
GLOVE SRG 8 PF TXTR STRL LF DI (GLOVE) ×2 IMPLANT
GLOVE SURG ENC MOIS LTX SZ8 (GLOVE) ×4 IMPLANT
GLOVE SURG UNDER POLY LF SZ8 (GLOVE) ×4
GOWN STRL REUS W/TWL XL LVL3 (GOWN DISPOSABLE) ×4 IMPLANT
HIP BALL CERAMIC (Hips) ×2 IMPLANT
HOLDER FOLEY CATH W/STRAP (MISCELLANEOUS) ×2 IMPLANT
KIT TURNOVER KIT A (KITS) ×2 IMPLANT
LINER ACET 32X48 (Liner) ×2 IMPLANT
MANIFOLD NEPTUNE II (INSTRUMENTS) ×2 IMPLANT
NEEDLE HYPO 22GX1.5 SAFETY (NEEDLE) ×2 IMPLANT
NS IRRIG 1000ML POUR BTL (IV SOLUTION) ×2 IMPLANT
PACK ANTERIOR HIP CUSTOM (KITS) ×2 IMPLANT
PENCIL SMOKE EVACUATOR (MISCELLANEOUS) IMPLANT
PROTECTOR NERVE ULNAR (MISCELLANEOUS) ×2 IMPLANT
RTRCTR WOUND ALEXIS 18CM MED (MISCELLANEOUS) ×2
STEM FEM ACTIS STD SZ4 (Stem) ×2 IMPLANT
SUT ETHIBOND NAB CT1 #1 30IN (SUTURE) ×4 IMPLANT
SUT VIC AB 1 CT1 36 (SUTURE) ×2 IMPLANT
SUT VIC AB 2-0 CT1 27 (SUTURE) ×2
SUT VIC AB 2-0 CT1 TAPERPNT 27 (SUTURE) ×1 IMPLANT
SUT VICRYL AB 3-0 FS1 BRD 27IN (SUTURE) ×2 IMPLANT
SUT VLOC 180 0 24IN GS25 (SUTURE) ×2 IMPLANT
SYR 50ML LL SCALE MARK (SYRINGE) ×2 IMPLANT
TRAY FOLEY MTR SLVR 16FR STAT (SET/KITS/TRAYS/PACK) ×2 IMPLANT

## 2021-02-08 NOTE — Evaluation (Signed)
Physical Therapy Evaluation Patient Details Name: [name] MRN: [id number] DOB: [birth year] Today's Date: 02/08/2021   History of Present Illness  Patient is 60 y.o. female s/p Rt THA anterior approach on 02/08/21 with PMH significant for osteoporosis, fibromyalgia, COPD, cervical cancer, depression, anxiety, OA, back surgery.    Clinical Impression  [name] is a 60 y.o. female POD 0 s/p Rt THA. Patient reports independence with mobility at baseline. Patient is now limited by functional impairments (see PT problem list below) and requires min guard/supervision for transfers and gait with RW. Patient was able to ambulate ~140 feet with RW and min guard/supervision and cues for safe walker management. Patient educated on safe sequencing for stair mobility and verbalized safe guarding position for people assisting with mobility. Patient's husband present and provided safe guarding during gait and stair mobility. Patient instructed in exercises to facilitate ROM and circulation. Patient will benefit from continued skilled PT interventions to address impairments and progress towards PLOF. Patient has met mobility goals at adequate level for discharge home; will continue to follow if pt continues acute stay to progress towards Mod I goals.     Follow Up Recommendations Follow surgeon's recommendation for DC plan and follow-up therapies;Home health PT    Equipment Recommendations  3in1 (PT)    Recommendations for Other Services       Precautions / Restrictions Precautions Precautions: Fall Restrictions Weight Bearing Restrictions: No Other Position/Activity Restrictions: WBAT      Mobility  Bed Mobility Overal bed mobility: Needs Assistance Bed Mobility: Supine to Sit     Supine to sit: Min guard;HOB elevated     General bed mobility comments: cues for use of giat belt to assist with Rt LE off EOB, guarding for safety. no assist needed, pt took extra time.     Transfers Overall transfer level: Needs assistance Equipment used: Rolling walker (2 wheeled) Transfers: Sit to/from Stand Sit to Stand: Min guard;Supervision         General transfer comment: cues for technique with RW, no assist needed from EOB, recliner, and toilet.  Ambulation/Gait Ambulation/Gait assistance: Min guard;Supervision Gait Distance (Feet): 140 Feet Assistive device: Rolling walker (2 wheeled) Gait Pattern/deviations: Step-to pattern;Decreased stride length;Decreased weight shift to right Gait velocity: decr   General Gait Details: cues for step pattern and proximity to RW. no overt LOB noted, pt's husband present and provided safe guarding for gait with cues from therapist.  Stairs Stairs: Yes Stairs assistance: Min guard;Supervision Stair Management: One rail Right;Step to pattern;Sideways Number of Stairs: 3 General stair comments: cues for step sequencing "up with good, down with bad" no overt LOB. pt with some antalgia while ascending stairs. Pt's husband provided safe gaurding/assist for stair mobility with cues from therapist.  Wheelchair Mobility    Modified Rankin (Stroke Patients Only)       Balance Overall balance assessment: Needs assistance Sitting-balance support: Feet supported Sitting balance-Leahy Scale: Good     Standing balance support: During functional activity;Bilateral upper extremity supported Standing balance-Leahy Scale: Fair Standing balance comment: UE support required for gait                             Pertinent Vitals/Pain Pain Assessment: Faces Faces Pain Scale: Hurts even more Pain Location: Rt hip Pain Descriptors / Indicators: Aching;Discomfort;Grimacing Pain Intervention(s): Limited activity within patient's tolerance;Monitored during session;Repositioned;Ice applied    Home Living Family/patient expects to be discharged to:: Private residence Living  Arrangements: Spouse/significant  other Available Help at Discharge: Family Type of Home: House Home Access: Stairs to enter Entrance Stairs-Rails: Right Entrance Stairs-Number of Steps: 4 Home Layout: One level Home Equipment: Walker - 2 wheels;Shower seat;Cane - single point Additional Comments: pt's husband will help her while recovering    Prior Function Level of Independence: Independent               Hand Dominance   Dominant Hand: Right    Extremity/Trunk Assessment   Upper Extremity Assessment Upper Extremity Assessment: Overall WFL for tasks assessed    Lower Extremity Assessment Lower Extremity Assessment: Overall WFL for tasks assessed;RLE deficits/detail RLE Deficits / Details: good quad activation, 4/5 or better for ankle dorsi/plantar flexion RLE: Unable to fully assess due to pain RLE Sensation: WNL RLE Coordination: WNL    Cervical / Trunk Assessment Cervical / Trunk Assessment: Normal  Communication   Communication: No difficulties  Cognition Arousal/Alertness: Awake/alert Behavior During Therapy: WFL for tasks assessed/performed Overall Cognitive Status: Within Functional Limits for tasks assessed                                        General Comments      Exercises Total Joint Exercises Ankle Circles/Pumps: AROM;Both;10 reps;Seated Quad Sets: AROM;Right;5 reps;Seated Heel Slides: AAROM;Right;5 reps;Seated   Assessment/Plan    PT Assessment Patient needs continued PT services  PT Problem List Decreased strength;Decreased range of motion;Decreased activity tolerance;Decreased balance;Decreased mobility;Decreased knowledge of use of DME;Decreased knowledge of precautions;Pain       PT Treatment Interventions DME instruction;Gait training;Stair training;Functional mobility training;Therapeutic activities;Therapeutic exercise;Balance training;Patient/family education    PT Goals (Current goals can be found in the Care Plan section)  Acute Rehab PT  Goals PT Goal Formulation: With patient Time For Goal Achievement: 02/15/21 Potential to Achieve Goals: Good    Frequency 7X/week   Barriers to discharge        Co-evaluation               AM-PAC PT "6 Clicks" Mobility  Outcome Measure Help needed turning from your back to your side while in a flat bed without using bedrails?: A Little Help needed moving from lying on your back to sitting on the side of a flat bed without using bedrails?: A Little Help needed moving to and from a bed to a chair (including a wheelchair)?: A Little Help needed standing up from a chair using your arms (e.g., wheelchair or bedside chair)?: A Little Help needed to walk in hospital room?: A Little Help needed climbing 3-5 steps with a railing? : A Little 6 Click Score: 18    End of Session Equipment Utilized During Treatment: Gait belt Activity Tolerance: Patient tolerated treatment well Patient left: in chair;with call bell/phone within reach;with family/visitor present Nurse Communication: Mobility status PT Visit Diagnosis: Muscle weakness (generalized) (M62.81);Difficulty in walking, not elsewhere classified (R26.2);Pain Pain - Right/Left: Right Pain - part of body: Hip    Time: 1825-1903 PT Time Calculation (min) (ACUTE ONLY): 38 min   Charges:   PT Evaluation $PT Eval Low Complexity: 1 Low PT Treatments $Gait Training: 8-22 mins $Therapeutic Exercise: 8-22 mins        Verner Mould, DPT Acute Rehabilitation Services Office [phone] Pager [phone]    Jacques Navy 02/08/2021, 7:24 PM

## 2021-02-08 NOTE — Discharge Instructions (Signed)

## 2021-02-08 NOTE — Progress Notes (Signed)
PACU note; pt made aware she would be spending night in hospital per MD order; pt very unhappy and requested her doctor be notified; contacted physician assistant Mitzi Hansen who spoke to pt and is agreeable to put in orders for pt to be discharged home pending approval with physical therapy and pain/nausea is controlled; IV fluids given, meds for pain will be given, and physical therapy made aware

## 2021-02-08 NOTE — Anesthesia Procedure Notes (Signed)
Spinal  Patient location during procedure: OR Staffing Performed: resident/CRNA  Resident/CRNA: Rosaland Lao, CRNA Preanesthetic Checklist Completed: patient identified, IV checked, site marked, risks and benefits discussed, surgical consent, monitors and equipment checked, pre-op evaluation and timeout performed Spinal Block Patient position: sitting Prep: DuraPrep Patient monitoring: heart rate, cardiac monitor, continuous pulse ox and blood pressure Approach: midline Location: L3-4 Injection technique: single-shot Needle Needle type: Pencan  Needle gauge: 24 G Needle length: 10 cm Assessment Sensory level: T4

## 2021-02-08 NOTE — Op Note (Signed)

## 2021-02-08 NOTE — Anesthesia Preprocedure Evaluation (Addendum)
Anesthesia Evaluation  Patient identified by MRN, date of birth, ID band Patient awake    Reviewed: Allergy & Precautions, NPO status , Patient's Chart, lab work & pertinent test results  Airway Mallampati: II  TM Distance: >3 FB Neck ROM: Limited    Dental  (+) Edentulous Lower, Edentulous Upper   Pulmonary COPD,  COPD inhaler, Current SmokerPatient did not abstain from smoking.,    Pulmonary exam normal        Cardiovascular negative cardio ROS Normal cardiovascular exam  ECG: NSR, rate 90   Neuro/Psych  Headaches, PSYCHIATRIC DISORDERS Anxiety Depression    GI/Hepatic Neg liver ROS, GERD  Medicated and Controlled,  Endo/Other  negative endocrine ROS  Renal/GU negative Renal ROS     Musculoskeletal  (+) Arthritis , Fibromyalgia -, narcotic dependent Cervical spondylosis without myelopathy   Abdominal   Peds  Hematology HLD   Anesthesia Other Findings Covid test negative   Reproductive/Obstetrics                           Anesthesia Physical Anesthesia Plan  ASA: III  Anesthesia Plan: Spinal   Post-op Pain Management:    Induction:   PONV Risk Score and Plan: 2 and Treatment may vary due to age or medical condition and Propofol infusion  Airway Management Planned: Natural Airway and Simple Face Mask  Additional Equipment: None  Intra-op Plan:   Post-operative Plan:   Informed Consent: I have reviewed the patients History and Physical, chart, labs and discussed the procedure including the risks, benefits and alternatives for the proposed anesthesia with the patient or authorized representative who has indicated his/her understanding and acceptance.       Plan Discussed with: CRNA and Anesthesiologist  Anesthesia Plan Comments: (Labs reviewed, platelets acceptable. Discussed risks and benefits of spinal, including spinal/epidural hematoma, infection, failed block, and PDPH.  Patient expressed understanding and wished to proceed. )       Anesthesia Quick Evaluation

## 2021-02-08 NOTE — Interval H&P Note (Signed)
History and Physical Interval Note:  02/08/2021 2:06 PM  [name]  has presented today for surgery, with the diagnosis of RIGHT HIP DEGENERATIVE JOINT DISEASE.  The various methods of treatment have been discussed with the patient and family. After consideration of risks, benefits and other options for treatment, the patient has consented to  Procedure(s): RIGHT TOTAL HIP ARTHROPLASTY ANTERIOR APPROACH (Right) as a surgical intervention.  The patient's history has been reviewed, patient examined, no change in status, stable for surgery.  I have reviewed the patient's chart and labs.  Questions were answered to the patient's satisfaction.     Hessie Dibble

## 2021-02-08 NOTE — Anesthesia Procedure Notes (Signed)
Procedure Name: LMA Insertion Performed by: Rosaland Lao, CRNA Pre-anesthesia Checklist: Patient identified, Emergency Drugs available, Suction available and Patient being monitored Patient Re-evaluated:Patient Re-evaluated prior to induction Oxygen Delivery Method: Circle system utilized Preoxygenation: Pre-oxygenation with 100% oxygen Induction Type: IV induction LMA: LMA inserted LMA Size: 4.0 Number of attempts: 1 Placement Confirmation: positive ETCO2 and breath sounds checked- equal and bilateral Tube secured with: Tape Dental Injury: Teeth and Oropharynx as per pre-operative assessment

## 2021-02-08 NOTE — Transfer of Care (Signed)
Immediate Anesthesia Transfer of Care Note  Patient: [name]  Procedure(s) Performed: RIGHT TOTAL HIP ARTHROPLASTY ANTERIOR APPROACH (Right Hip)  Patient Location: PACU  Anesthesia Type:General and Spinal  Level of Consciousness: awake, alert  and oriented  Airway & Oxygen Therapy: Patient Spontanous Breathing and Patient connected to face mask  Post-op Assessment: Report given to RN and Post -op Vital signs reviewed and stable  Post vital signs: Reviewed and stable  Last Vitals:  Vitals Value Taken Time  BP 134/72 02/08/21 1607  Temp    Pulse 72 02/08/21 1609  Resp 13 02/08/21 1609  SpO2 100 % 02/08/21 1609  Vitals shown include unvalidated device data.  Last Pain:  Vitals:   02/08/21 1323  TempSrc:   PainSc: 0-No pain         Complications: No complications documented.

## 2021-02-09 ENCOUNTER — Encounter (HOSPITAL_COMMUNITY): Payer: Self-pay | Admitting: Orthopaedic Surgery

## 2021-02-11 NOTE — Anesthesia Postprocedure Evaluation (Signed)
Anesthesia Post Note  Patient: [name]  Procedure(s) Performed: RIGHT TOTAL HIP ARTHROPLASTY ANTERIOR APPROACH (Right Hip)     Patient location during evaluation: PACU Anesthesia Type: General Level of consciousness: awake and alert Pain management: pain level controlled Vital Signs Assessment: post-procedure vital signs reviewed and stable Respiratory status: spontaneous breathing, nonlabored ventilation, respiratory function stable and patient connected to nasal cannula oxygen Cardiovascular status: blood pressure returned to baseline and stable Postop Assessment: no apparent nausea or vomiting Anesthetic complications: no   No complications documented.  Last Vitals:  Vitals:   02/08/21 1900 02/08/21 1940  BP: (!) 150/85 (!) 145/82  Pulse: 74 80  Resp: 19 17  Temp:  36.9 C  SpO2: 98% 97%    Last Pain:  Vitals:   02/08/21 1940  TempSrc:   PainSc: 2                  Effie Berkshire

## 2021-06-06 ENCOUNTER — Telehealth: Payer: Self-pay | Admitting: Pulmonary Disease

## 2021-06-06 MED ORDER — NIRMATRELVIR/RITONAVIR (PAXLOVID)TABLET
3.0000 | ORAL_TABLET | Freq: Two times a day (BID) | ORAL | 0 refills | Status: DC
Start: 2021-06-06 — End: 2021-06-06

## 2021-06-06 MED ORDER — PREDNISONE 10 MG PO TABS: 10.0000 mg | ORAL_TABLET | Freq: Every day | ORAL | 0 refills | Status: DC

## 2021-06-06 MED ORDER — DOXYCYCLINE HYCLATE 100 MG PO TABS: 100.0000 mg | ORAL_TABLET | Freq: Every day | ORAL | 0 refills | Status: DC

## 2021-06-06 MED ORDER — NIRMATRELVIR/RITONAVIR (PAXLOVID)TABLET: 3.0000 | ORAL_TABLET | Freq: Two times a day (BID) | ORAL | 0 refills | Status: DC

## 2021-06-06 MED ORDER — NIRMATRELVIR/RITONAVIR (PAXLOVID)TABLET: 3.0000 | ORAL_TABLET | Freq: Two times a day (BID) | ORAL | 0 refills | Status: AC

## 2021-06-06 NOTE — Telephone Encounter (Signed)
With loss of taste and smell, we have to supect this may be Covid and need to start Paxlovid before the time window closes. Ok to also send doxycycline 100 mg, # 8, 2 today then one daily, and prednisone 10 mg, # 20, 4 X 2 DAYS, 3 X 2 DAYS, 2 X 2 DAYS, 1 X 2 DAYS. Stay well hydrated, rest and self-quarantine to avoid spreading this.  Let us know result of Covide test. Thanks

## 2021-06-06 NOTE — Telephone Encounter (Signed)
LMTCB

## 2021-06-06 NOTE — Addendum Note (Signed)
Addended byCoralie Keens on: 06/06/2021 04:25 PM   Modules accepted: Orders

## 2021-06-06 NOTE — Telephone Encounter (Signed)
Primary Pulmonologist: Young Last office visit and with whom: 02/02/21 Young What do we see them for (pulmonary problems): COPD Last OV assessment/plan:  Assessment and Plan: COPD exacerbation   Follow Up Instructions:     I discussed the assessment and treatment plan with the patient. The patient was provided an opportunity to ask questions and all were answered. The patient agreed with the plan and demonstrated an understanding of the instructions.   The patient was advised to call back or seek an in-person evaluation if the symptoms worsen or if the condition fails to improve as anticipated.   I provided 15 minutes of non-face-to-face time during this encounter.     Baird Lyons, MD           Assessment & Plan Note by Deneise Lever, MD at 02/02/2021 11:30 AM  Author: Deneise Lever, MD Author Type: Physician Filed: 02/02/2021 11:31 AM  Note Status: Written Cosign: Cosign Not Required Encounter Date: 02/02/2021  Problem: Tobacco abuse  Editor: Deneise Lever, MD (Physician)               She is aware this is strongly adverse to her well-being. Not prepared to stop now. Plan- ongoing support and counseling as she follows here.         Assessment & Plan Note by Deneise Lever, MD at 02/02/2021 11:27 AM  Author: Deneise Lever, MD Author Type: Physician Filed: 02/02/2021 11:29 AM  Note Status: Written Cosign: Cosign Not Required Encounter Date: 02/02/2021  Problem: COPD with acute exacerbation [facility])  Editor: Deneise Lever, MD (Physician)               Underlying Chronic respiratory failure, ongoing tobacco abuse. Knee surgery already rescheduled once. Appropriate to continue self isolation at home for now. If she gets worse she will need retesting for Covid. Sounds very mild over phone. Plan- symptomatic management. Did agree to send Zpak.            Patient Instructions by Deneise Lever, MD at 02/02/2021 11:00 AM  Author: Deneise Lever, MD Author Type:  Physician Filed: 02/02/2021 11:07 AM  Note Status: Signed Cosign: Cosign Not Required Encounter Date: 02/02/2021  Editor: Deneise Lever, MD (Physician)               [facility]   Appointment made to follow up with Dr Melvyn Novas for your breathing problems in 6 months, to give you time to get up and going again after your knee surgery. Please call us if you need help before then.   I hope the knee surgery goes well.          Instructions    Return in about 6 months (around 08/02/2021) for follow up with Dr Melvyn Novas.  [facility]   Appointment made to follow up with Dr Melvyn Novas for your breathing problems in 6 months, to give you time to get up and going again after your knee surgery. Please call us if you need help before then.   I hope the knee surgery goes well.          After Visit Summary (Printed 02/02/2021)   Reason for call: Patient began feeling bad last Wednesday (06/01/21) with fever, headache, loss of taste/smell.  She felt is was just a cold/flu.  The past 3-4 days she has had a cough with yellow mucous, wheezing, chest congestion and a mild sore throat.  She no longer had a fever, HA, loss of taste/smell.  Her husband had the same symptoms, he is currently in the hospital, not related to mentioned symptoms, he has problems with his liver.  She feels like she has bronchitis.  She says usually she gets some prednisone and an antibiotic and she gets over it.  [facility] and will not have results for 2-3 days.  She only had one covid vaccine and it did not go well and she will not be getting anymore.  She is using all her prescribed meds.  Dr. Annamaria Boots, please advise.  Thank you.  (examples of things to ask: : When did symptoms start? Fever? Cough? Productive? Color to sputum? More sputum than usual? Wheezing? Have you needed increased oxygen? Are you taking your respiratory medications? What over the counter measures  have you tried?)  Allergies  Allergen Reactions   Codeine Nausea Only   Topamax [Topiramate] Hives   Lyrica [Pregabalin] Swelling    Immunization History  Administered Date(s) Administered   Influenza,inj,Quad PF,6+ Mos 11/29/2015, 02/09/2020   PFIZER(Purple Top)SARS-COV-2 Vaccination 08/11/2020   Pneumococcal Polysaccharide-23 12/29/2020   Tdap 10/23/2012

## 2021-06-06 NOTE — Telephone Encounter (Signed)
I called and spoke with patient regarding Dr. Thomes Cake. Patient verbalized understanding and I have sent in paxlovid, doxy and pred taper to preferred pharmacy. Patient is aware to let us know covid results and if not feeling better. Nothing further needed.

## 2021-06-06 NOTE — Telephone Encounter (Signed)
Pt calling in phone disconnected while speaking with a nurse . Please advise    Call back [phone]

## 2021-06-13 ENCOUNTER — Telehealth: Payer: Self-pay | Admitting: Internal Medicine

## 2021-06-13 MED ORDER — AZITHROMYCIN 250 MG PO TABS: ORAL_TABLET | ORAL | 0 refills | Status: AC

## 2021-06-13 NOTE — Telephone Encounter (Signed)
Spoke to patient, who stated that she completed course of prednisone this morning and doxycyline yesterday with no mild in sx. She would like zpak, as this has helped in the past. C/o prod cough with yellow sputum body aches and wheezing x1w. cough worsens at night and keeps her awake. Sob is baseline. Denied fever, chills or sweats. She does not have supplemental oxygen. She is using tussin DM, Ventolin 1-2 daily and Symbicort BID with temporary relief in sx.  One covid vaccine and flu shot.   Dr. Annamaria Boots, please advise. Thanks  Current Outpatient Medications on File Prior to Visit  Medication Sig Dispense Refill   albuterol (VENTOLIN HFA) 108 (90 Base) MCG/ACT inhaler Inhale 1-2 puffs into the lungs every 6 (six) hours as needed for wheezing or shortness of breath.     aspirin EC 81 MG tablet Take 1 tablet (81 mg total) by mouth daily. Swallow whole. 60 tablet 0   Aspirin-Acetaminophen-Caffeine (GOODY HEADACHE PO) Take 1 packet by mouth daily as needed (headaches).     atorvastatin (LIPITOR) 10 MG tablet Take 10 mg by mouth daily.     azithromycin (ZITHROMAX) 250 MG tablet 2 today then one daily 6 tablet 0   budesonide-formoterol (SYMBICORT) 160-4.5 MCG/ACT inhaler Inhale 2 puffs into the lungs 2 (two) times daily. 1 each 3   butalbital-acetaminophen-caffeine (FIORICET) 50-325-40 MG tablet Take 1 tablet by mouth 2 (two) times daily as needed for headache or migraine.     docusate sodium (COLACE) 100 MG capsule Take 100 mg by mouth daily as needed for mild constipation. (Patient not taking: Reported on 02/02/2021)     doxycycline (VIBRA-TABS) 100 MG tablet Take 1 tablet (100 mg total) by mouth daily. Take 2 today then 1 daily until done 8 tablet 0   Erenumab-aooe (AIMOVIG, 140 MG DOSE, [place]) Inject 140 mg into the skin every 30 (thirty) days.      HYDROmorphone (DILAUDID) 8 MG tablet Take 10 mg by mouth 4 (four) times daily as needed for severe pain.     lamoTRIgine (LAMICTAL) 100 MG tablet Take  100 mg by mouth 2 (two) times daily.      LINZESS 145 MCG CAPS capsule Take 145 mcg by mouth daily as needed for constipation.     mirtazapine (REMERON) 15 MG tablet Take 15 mg by mouth at bedtime. (Patient not taking: No sig reported)     nortriptyline (PAMELOR) 75 MG capsule Take 75-150 mg by mouth at bedtime.      ondansetron (ZOFRAN) 4 MG tablet Take 4 mg by mouth every 8 (eight) hours as needed for nausea or vomiting.     Oxycodone HCl 10 MG TABS Take 10 mg by mouth 4 (four) times daily as needed (pain).     pantoprazole (PROTONIX) 40 MG tablet Take 40 mg by mouth daily.     predniSONE (DELTASONE) 10 MG tablet Take 1 tablet (10 mg total) by mouth daily with breakfast. Take 4 tab for 2 DAYS, 3 tabs for 2 DAYS, 2 tabs for 2 DAYS, 1 tab for 2 DAYS then stop 20 tablet 0   Respiratory Therapy Supplies (FLUTTER) DEVI Use as directed. 1 each 0   Rimegepant Sulfate (NURTEC) 75 MG TBDP Take 75 mg by mouth daily as needed (migraine).     SUMAtriptan (IMITREX) 100 MG tablet TAKE 1 TABLET (100 MG TOTAL) BY MOUTH AS NEEDED FOR MIGRAINE. (Patient taking differently: Take 50 mg by mouth every 2 (two) hours as needed for migraine.) 9 tablet  0   tiZANidine (ZANAFLEX) 4 MG tablet Take 1 tablet (4 mg total) by mouth 3 (three) times daily. (Patient taking differently: Take 4 mg by mouth 2 (two) times daily.) 90 tablet 0   No current facility-administered medications on file prior to visit.     Allergies  Allergen Reactions   Codeine Nausea Only   Topamax [Topiramate] Hives   Lyrica [Pregabalin] Swelling

## 2021-06-13 NOTE — Telephone Encounter (Signed)
Zpak has been sent to preferred pharmacy.  Patient is aware and voiced her understanding.  Nothing further needed at this time.

## 2021-06-13 NOTE — Telephone Encounter (Signed)
Ok to send Zpak as she requests 250 mg, # 6, 2 today then one daily Thanks

## 2021-06-13 NOTE — Telephone Encounter (Signed)
Pt is calling bc of her bronchitis stated Dr. Annamaria Boots called in a rx for prednisone last wk but she said that it has not helped her and stated that she is wanting to know if Dr. Annamaria Boots can call in a z-pak for her. [id number] [place]. Pls regard; [phone]

## 2021-11-21 ENCOUNTER — Telehealth: Payer: Self-pay | Admitting: Internal Medicine

## 2021-11-21 MED ORDER — PREDNISONE 10 MG PO TABS: ORAL_TABLET | ORAL | 0 refills | Status: DC

## 2021-11-21 MED ORDER — AZITHROMYCIN 250 MG PO TABS: ORAL_TABLET | ORAL | 0 refills | Status: DC

## 2021-11-21 NOTE — Telephone Encounter (Signed)
Called and spoke with patient. She stated that she has been coughing for the past 5 days. The cough has been productive with clear phlegm. She has also been wheezing more. Her nose has been runny (clear discharge) and her throat is sore from the post nasal drip. She believes she has developed bronchitis.   She wanted to know if [facility] would be willing to send in a zpak and prednisone for her. She is leaving tomorrow for [place] to care for her son who is scheduled to have surgery later this week.   [facility] [address] can you please advise? Thanks!

## 2021-11-21 NOTE — Telephone Encounter (Signed)
Zpak 250 mg, # 6, 2 today then one daily  Prednisone 10 mg, # 20, 2 today then one daily

## 2021-11-28 ENCOUNTER — Telehealth: Payer: Self-pay | Admitting: Acute Care

## 2021-11-29 MED ORDER — BUDESONIDE-FORMOTEROL FUMARATE 160-4.5 MCG/ACT IN AERO: 2.0000 | INHALATION_SPRAY | Freq: Two times a day (BID) | RESPIRATORY_TRACT | 0 refills | Status: DC

## 2021-11-29 MED ORDER — ALBUTEROL SULFATE HFA 108 (90 BASE) MCG/ACT IN AERS: 1.0000 | INHALATION_SPRAY | Freq: Four times a day (QID) | RESPIRATORY_TRACT | 0 refills | Status: DC | PRN

## 2021-11-29 NOTE — Telephone Encounter (Signed)
I have sent rx refills for Symbicort and Ventolin hfa to [facility]  Nothing further needed

## 2021-12-14 ENCOUNTER — Ambulatory Visit: Payer: Medicare HMO | Admitting: Acute Care

## 2022-01-04 ENCOUNTER — Ambulatory Visit: Payer: Self-pay | Admitting: Acute Care

## 2022-01-09 ENCOUNTER — Encounter: Payer: Self-pay | Admitting: Acute Care

## 2022-01-09 ENCOUNTER — Other Ambulatory Visit: Payer: Self-pay

## 2022-01-09 ENCOUNTER — Ambulatory Visit: Payer: Medicare HMO | Admitting: Acute Care

## 2022-01-09 VITALS — BP 140/82 | HR 87 | Temp 98.8°F | Ht 64.0 in | Wt 125.0 lb

## 2022-01-09 DIAGNOSIS — Z23 Encounter for immunization: Secondary | ICD-10-CM | POA: Diagnosis not present

## 2022-01-09 DIAGNOSIS — F17210 Nicotine dependence, cigarettes, uncomplicated: Secondary | ICD-10-CM

## 2022-01-09 DIAGNOSIS — J441 Chronic obstructive pulmonary disease with (acute) exacerbation: Secondary | ICD-10-CM

## 2022-01-09 MED ORDER — BREZTRI AEROSPHERE 160-9-4.8 MCG/ACT IN AERO: 2.0000 | INHALATION_SPRAY | Freq: Two times a day (BID) | RESPIRATORY_TRACT | 0 refills | Status: DC

## 2022-01-09 NOTE — Patient Instructions (Addendum)
It is good to see you. We will do a therapeutic Trial with Breztri. 2 puffs in the morning and 2 puffs in the evening.  Rinse mouth after use. Hold Symbicort while you are using Breztri.   We will give you your pneumonia shot and your flu vaccine today. Please consider getting the Covid Booster and injections. Follow up in 1 month video visit to see if you like Breztri.with Judson Roch NP, or Dr. Elsworth Soho  If you do we will send in prescription at that time.  Rescue as needed for breakthrough shortness of breath.  Work on quitting smoking. 1-800 QUIT NOW for free nicotine patched gum or mints.  Due for lung cancer screening CT Please contact office for sooner follow up if symptoms do not improve or worsen or seek emergency care   I have attempted to contact this patient by phone with the following results: .  Call If you need [place] before your follow up.

## 2022-01-09 NOTE — Progress Notes (Signed)
History of Present Illness [name] is a 61 y.o. female  current every day smoker with COPD, bronchitis. She is followed by Dr. Elsworth Soho Symbicort and Ventolin Maintenance.   01/09/2022 Pt. Presents for follow up. She was last seen in the office 01/2021 with a COPD Flare. She called the office 11/21/2021 for a flare, and Dr. Annamaria Boots called in prednisone and a z pack. She got better after the prednisone and z pack. She went to [place] 12/07/2021 and she went to urgent care . He gave her a steroid shot and an antibiotic injection , then followed that with prescriptions for prednisone and amoxicillin x 7 days. She has been doing well since then. She is still smoking. She  has her baseline dyspnea and productive cough. Secretions are clear. She rarely has to use her rescue inhaler. She does have breakthrough shortness of breath. She would like to try Breztri. We will do a therapeutic Trial . She wants her pneumovax and her flu vaccine. We will give her both today.   Test Results:   CBC Latest Ref Rng & Units 02/01/2021 12/31/2020 05/25/2010  WBC 4.0 - 10.5 K/uL 12.6(H) 6.6 7.9  Hemoglobin 12.0 - 15.0 g/dL 13.6 13.4 14.0  Hematocrit 36.0 - 46.0 % 42.5 41.8 40.0  Platelets 150 - 400 K/uL 385 381 269    BMP Latest Ref Rng & Units 02/01/2021 12/31/2020 05/23/2009  Glucose 70 - 99 mg/dL 92 112(H) 72  BUN 8 - 23 mg/dL 19 10 14   Creatinine 0.44 - 1.00 mg/dL 0.81 0.74 0.72  Sodium 135 - 145 mmol/L 141 142 138  Potassium 3.5 - 5.1 mmol/L 4.1 3.9 3.0(L)  Chloride 98 - 111 mmol/L 105 104 107  CO2 22 - 32 mmol/L 27 27 22   Calcium 8.9 - 10.3 mg/dL 9.3 9.5 8.9    BNP No results found for: BNP  ProBNP No results found for: PROBNP  PFT    Component Value Date/Time   FEV1PRE 1.53 01/28/2020 0913   FEV1POST 1.62 01/28/2020 0913   FVCPRE 2.27 01/28/2020 0913   FVCPOST 2.50 01/28/2020 0913   TLC 5.42 01/28/2020 0913   DLCOUNC 15.22 01/28/2020 0913   PREFEV1FVCRT 67 01/28/2020 0913   PSTFEV1FVCRT 65  01/28/2020 0913    No results found.   Past medical hx Past Medical History:  Diagnosis Date   Anxiety    Arthritis    Cancer ([place])    Cancer of cervix ([place])    Cervical spondylosis without myelopathy 08/04/2013   COPD (chronic obstructive pulmonary disease) ([facility])    Depression    Dyspnea    on exertion   Fibromyalgia    Migraine without aura, with intractable migraine, so stated, without mention of status migrainosus 12/2020   Osteoporosis      Social History   Tobacco Use   Smoking status: Every Day    Packs/day: 0.25    Years: 46.00    Pack years: 11.50    Types: Cigarettes   Smokeless tobacco: Never   Tobacco comments:    only puffs, does not inhale  Vaping Use   Vaping Use: Never used  Substance Use Topics   Alcohol use: Yes    Comment: occas.   Drug use: No    [name] reports that she has been smoking cigarettes. She has a 11.50 pack-year smoking history. She has never used smokeless tobacco. She reports current alcohol use. She reports that she does not use drugs.  Tobacco Cessation: Current Every day  smoker  Past surgical hx, Family hx, Social hx all reviewed.  Current Outpatient Medications on File Prior to Visit  Medication Sig   albuterol (VENTOLIN HFA) 108 (90 Base) MCG/ACT inhaler Inhale 1-2 puffs into the lungs every 6 (six) hours as needed for wheezing or shortness of breath.   aspirin EC 81 MG tablet Take 1 tablet (81 mg total) by mouth daily. Swallow whole.   Aspirin-Acetaminophen-Caffeine (GOODY HEADACHE PO) Take 1 packet by mouth daily as needed (headaches).   atorvastatin (LIPITOR) 10 MG tablet Take 10 mg by mouth daily.   azithromycin (ZITHROMAX) 250 MG tablet 2 today then one daily   azithromycin (ZITHROMAX) 250 MG tablet Take 2 tablets on first day, then 1 tablet daily until finished.   budesonide-formoterol (SYMBICORT) 160-4.5 MCG/ACT inhaler Inhale 2 puffs into the lungs 2 (two) times daily.   butalbital-acetaminophen-caffeine  (FIORICET) 50-325-40 MG tablet Take 1 tablet by mouth 2 (two) times daily as needed for headache or migraine.   docusate sodium (COLACE) 100 MG capsule Take 100 mg by mouth daily as needed for mild constipation. (Patient not taking: Reported on 02/02/2021)   Erenumab-aooe (AIMOVIG, 140 MG DOSE, [place]) Inject 140 mg into the skin every 30 (thirty) days.    HYDROmorphone (DILAUDID) 8 MG tablet Take 10 mg by mouth 4 (four) times daily as needed for severe pain.   lamoTRIgine (LAMICTAL) 100 MG tablet Take 100 mg by mouth 2 (two) times daily.    LINZESS 145 MCG CAPS capsule Take 145 mcg by mouth daily as needed for constipation.   mirtazapine (REMERON) 15 MG tablet Take 15 mg by mouth at bedtime. (Patient not taking: No sig reported)   nortriptyline (PAMELOR) 75 MG capsule Take 75-150 mg by mouth at bedtime.    ondansetron (ZOFRAN) 4 MG tablet Take 4 mg by mouth every 8 (eight) hours as needed for nausea or vomiting.   Oxycodone HCl 10 MG TABS Take 10 mg by mouth 4 (four) times daily as needed (pain).   pantoprazole (PROTONIX) 40 MG tablet Take 40 mg by mouth daily.   predniSONE (DELTASONE) 10 MG tablet Take 2 tablets on first day, then 1 tablet daily until finished   Respiratory Therapy Supplies (FLUTTER) DEVI Use as directed.   Rimegepant Sulfate (NURTEC) 75 MG TBDP Take 75 mg by mouth daily as needed (migraine).   SUMAtriptan (IMITREX) 100 MG tablet TAKE 1 TABLET (100 MG TOTAL) BY MOUTH AS NEEDED FOR MIGRAINE. (Patient taking differently: Take 50 mg by mouth every 2 (two) hours as needed for migraine.)   tiZANidine (ZANAFLEX) 4 MG tablet Take 1 tablet (4 mg total) by mouth 3 (three) times daily. (Patient taking differently: Take 4 mg by mouth 2 (two) times daily.)   No current facility-administered medications on file prior to visit.     Allergies  Allergen Reactions   Codeine Nausea Only   Topamax [Topiramate] Hives   Lyrica [Pregabalin] Swelling    Review Of Systems:  Constitutional:   No   weight loss, night sweats,  Fevers, chills, fatigue, or  lassitude.  HEENT:   No headaches,  Difficulty swallowing,  Tooth/dental problems, or  Sore throat,                No sneezing, itching, ear ache, nasal congestion, post nasal drip,   CV:  No chest pain,  Orthopnea, PND, swelling in lower extremities, anasarca, dizziness, palpitations, syncope.   GI  No heartburn, indigestion, abdominal pain, nausea, vomiting, diarrhea, change in bowel  habits, loss of appetite, bloody stools.   Resp: + baseline  shortness of breath with exertion less  at rest.   Baseline  excess mucus, no productive cough,  No non-productive cough,  No coughing up of blood.  No change in color of mucus.  + wheezing.  No chest wall deformity  Skin: no rash or lesions.  GU: no dysuria, change in color of urine, no urgency or frequency.  No flank pain, no hematuria   MS:  No joint pain or swelling.  No decreased range of motion.  No back pain.  Psych:  No change in mood or affect. No depression or anxiety.  No memory loss.   Vital Signs BP 140/82 (BP Location: Left Arm, Cuff Size: Normal)    Pulse 87    Temp 98.8 F (37.1 C) (Oral)    Ht 5\' 4"  (1.626 m)    Wt 125 lb (56.7 kg)    SpO2 100%    BMI 21.46 kg/m    Physical Exam:  General- No distress,  A&Ox3,  ENT: No sinus tenderness, TM clear, pale nasal mucosa, no oral exudate,no post nasal drip, no LAN Cardiac: S1, S2, regular rate and rhythm, no murmur Chest: No wheeze/ rales/ dullness; no accessory muscle use, no nasal flaring, no sternal retractions Abd.: Soft Non-tender, ND, BS +, Body mass index is 21.46 kg/m.  Ext: No clubbing cyanosis, edema Neuro:  normal strength, MAE x 4, A&O x 3 Skin: No rashes, warm and dry, no lesions Psych: normal mood and behavior   Assessment/Plan COPD , Bronchitis Current Every day smoker Needs LDCT for screening scheduled Plan We will do a therapeutic Trial with Breztri. 2 puffs in the morning and 2 puffs in the  evening.  Rinse mouth after use. Hold Symbicort while you are using Breztri.   We will give you your pneumonia shot and your flu vaccine today. Please consider getting the Covid Booster and injections. Follow up in 1 month video visit to see if you like Breztri.with Judson Roch NP, or Dr. Elsworth Soho  If you do we will send in prescription at that time.  Rescue as needed for breakthrough shortness of breath.  Work on quitting smoking. 1-800 QUIT NOW for free nicotine patched gum or mints.  Due for lung cancer screening CT Please contact office for sooner follow up if symptoms do not improve or worsen or seek emergency care   I have attempted to contact this patient by phone with the following results: .  Call If you need [place] before your follow up.   I spent 45 minutes dedicated to the care of this patient on the date of this encounter to include pre-visit review of records, face-to-face time with the patient discussing conditions above, post visit ordering of testing, clinical documentation with the electronic health record, making appropriate referrals as documented, and communicating necessary information to the patient's healthcare team.      Magdalen Spatz, NP 01/09/2022  12:09 PM

## 2022-01-10 ENCOUNTER — Telehealth: Payer: Self-pay | Admitting: Acute Care

## 2022-01-10 NOTE — Telephone Encounter (Signed)
Attempted to reach to schedule LDCT for [facility].  Left voicemail and call back number.

## 2022-01-17 ENCOUNTER — Other Ambulatory Visit: Payer: Self-pay | Admitting: Family Medicine

## 2022-01-17 DIAGNOSIS — Z1231 Encounter for screening mammogram for malignant neoplasm of breast: Secondary | ICD-10-CM

## 2022-01-26 ENCOUNTER — Ambulatory Visit: Payer: Medicare HMO

## 2022-02-07 ENCOUNTER — Ambulatory Visit
Admission: RE | Admit: 2022-02-07 | Discharge: 2022-02-07 | Disposition: A | Discharge status: Home / Self Care | Payer: Medicare HMO | Source: Ambulatory Visit | Attending: Family Medicine | Admitting: Family Medicine

## 2022-02-07 DIAGNOSIS — Z1231 Encounter for screening mammogram for malignant neoplasm of breast: Secondary | ICD-10-CM

## 2022-02-07 NOTE — Telephone Encounter (Signed)
Unable to reach to schedule yearly LDCT.  Left voicemail and callback number for patient to call us to schedule

## 2022-02-08 ENCOUNTER — Other Ambulatory Visit: Payer: Self-pay | Admitting: *Deleted

## 2022-02-08 DIAGNOSIS — F17210 Nicotine dependence, cigarettes, uncomplicated: Secondary | ICD-10-CM

## 2022-02-08 DIAGNOSIS — Z87891 Personal history of nicotine dependence: Secondary | ICD-10-CM

## 2022-02-11 ENCOUNTER — Other Ambulatory Visit: Payer: Self-pay | Admitting: Internal Medicine

## 2022-02-21 ENCOUNTER — Ambulatory Visit (INDEPENDENT_AMBULATORY_CARE_PROVIDER_SITE_OTHER)
Admission: RE | Admit: 2022-02-21 | Discharge: 2022-02-21 | Disposition: A | Discharge status: Home / Self Care | Payer: Medicare HMO | Source: Ambulatory Visit | Attending: Acute Care | Admitting: Acute Care

## 2022-02-21 ENCOUNTER — Other Ambulatory Visit: Payer: Self-pay

## 2022-02-21 DIAGNOSIS — Z87891 Personal history of nicotine dependence: Secondary | ICD-10-CM | POA: Diagnosis not present

## 2022-02-21 DIAGNOSIS — F17210 Nicotine dependence, cigarettes, uncomplicated: Secondary | ICD-10-CM | POA: Diagnosis not present

## 2022-02-23 ENCOUNTER — Other Ambulatory Visit: Payer: Self-pay | Admitting: Acute Care

## 2022-02-23 ENCOUNTER — Other Ambulatory Visit: Payer: Self-pay | Admitting: Internal Medicine

## 2022-02-23 DIAGNOSIS — F17210 Nicotine dependence, cigarettes, uncomplicated: Secondary | ICD-10-CM

## 2022-02-23 DIAGNOSIS — Z87891 Personal history of nicotine dependence: Secondary | ICD-10-CM

## 2022-02-23 NOTE — Telephone Encounter (Signed)
-   Albuterol refilled.

## 2022-03-21 ENCOUNTER — Telehealth: Payer: Self-pay | Admitting: Internal Medicine

## 2022-03-21 MED ORDER — DOXYCYCLINE HYCLATE 100 MG PO TABS: 100.0000 mg | ORAL_TABLET | Freq: Two times a day (BID) | ORAL | 0 refills | Status: DC

## 2022-03-21 NOTE — Telephone Encounter (Signed)
Patient is in [place] and has bronchitis, wheezing, coughing, yellow mucus, low fever, sore throat. Patient is needing a zpak called into [facility] located at [place], [address]. ? ?Please advise, call back [phone]. ?

## 2022-03-21 NOTE — Telephone Encounter (Addendum)
Patient is aware of recommendations and voiced her understanding.  ?Doxy sent to preferred pharmacy. ?Nothing further needed.  ? ?

## 2022-03-21 NOTE — Telephone Encounter (Signed)
Spoke to patient.  ?She stated that she is currently in [place] and she has developed bronchitis.  ?C/o prod cough with yellow sputum, sore throat, wheezing, body aches and temp of 99.9 x4d. ?Negative home covid test yesterday.  ?She is using albuterol HFA BID and Symbicort BID.  ?She has not switched to [place] yet. ? ? ?Dr. Annamaria Boots, please advise. Thanks ? ?Current Outpatient Medications on File Prior to Visit  ?Medication Sig Dispense Refill  ? albuterol (VENTOLIN HFA) 108 (90 Base) MCG/ACT inhaler INHALE 1 TO 2 PUFFS EVERY 6 HOURS AS NEEDED FOR WHEEZING OR SHORTNESS OF BREATH. 54 g 3  ? Aspirin-Acetaminophen-Caffeine (GOODY HEADACHE PO) Take 1 packet by mouth daily as needed (headaches).    ? atorvastatin (LIPITOR) 10 MG tablet Take 10 mg by mouth daily.    ? Budeson-Glycopyrrol-Formoterol (BREZTRI AEROSPHERE) 160-9-4.8 MCG/ACT AERO Inhale 2 puffs into the lungs 2 (two) times daily. 5.9 g 0  ? budesonide-formoterol (SYMBICORT) 160-4.5 MCG/ACT inhaler Inhale 2 puffs into the lungs 2 (two) times daily. 3 each 0  ? butalbital-acetaminophen-caffeine (FIORICET) 50-325-40 MG tablet Take 1 tablet by mouth 2 (two) times daily as needed for headache or migraine.    ? docusate sodium (COLACE) 100 MG capsule Take 100 mg by mouth daily as needed for mild constipation. (Patient not taking: Reported on 02/02/2021)    ? Erenumab-aooe (AIMOVIG, 140 MG DOSE, [place]) Inject 140 mg into the skin every 30 (thirty) days.     ? HYDROmorphone (DILAUDID) 8 MG tablet Take 10 mg by mouth 4 (four) times daily as needed for severe pain.    ? lamoTRIgine (LAMICTAL) 100 MG tablet Take 100 mg by mouth 2 (two) times daily.     ? LINZESS 145 MCG CAPS capsule Take 145 mcg by mouth daily as needed for constipation.    ? mirtazapine (REMERON) 15 MG tablet Take 15 mg by mouth at bedtime. (Patient not taking: No sig reported)    ? nortriptyline (PAMELOR) 75 MG capsule Take 75-150 mg by mouth at bedtime.     ? ondansetron (ZOFRAN) 4 MG tablet Take 4 mg by  mouth every 8 (eight) hours as needed for nausea or vomiting.    ? Oxycodone HCl 10 MG TABS Take 10 mg by mouth 4 (four) times daily as needed (pain).    ? pantoprazole (PROTONIX) 40 MG tablet Take 40 mg by mouth daily.    ? predniSONE (DELTASONE) 10 MG tablet Take 2 tablets on first day, then 1 tablet daily until finished 20 tablet 0  ? Respiratory Therapy Supplies (FLUTTER) DEVI Use as directed. 1 each 0  ? Rimegepant Sulfate (NURTEC) 75 MG TBDP Take 75 mg by mouth daily as needed (migraine).    ? SUMAtriptan (IMITREX) 100 MG tablet TAKE 1 TABLET (100 MG TOTAL) BY MOUTH AS NEEDED FOR MIGRAINE. (Patient taking differently: Take 50 mg by mouth every 2 (two) hours as needed for migraine.) 9 tablet 0  ? tiZANidine (ZANAFLEX) 4 MG tablet Take 1 tablet (4 mg total) by mouth 3 (three) times daily. (Patient taking differently: Take 4 mg by mouth 2 (two) times daily.) 90 tablet 0  ? ?No current facility-administered medications on file prior to visit.  ? ? ?Allergies  ?Allergen Reactions  ? Codeine Nausea Only  ? Topamax [Topiramate] Hives  ? Lyrica [Pregabalin] Swelling  ? ? ?

## 2022-03-21 NOTE — Telephone Encounter (Signed)
Lm for patient.  

## 2022-03-21 NOTE — Telephone Encounter (Signed)
Please send doxycycline 100 mg, # 14, 1 twice daily 

## 2022-04-04 ENCOUNTER — Telehealth: Payer: Self-pay | Admitting: Internal Medicine

## 2022-04-04 NOTE — Telephone Encounter (Signed)
Called and spoke with patient. She stated that she finished the doxycycline that was prescribed on 03/21/22. She felt better for a few days but then the SOB and cough returned. She has a productive cough with clear phlegm. She has also been wheezing more. She denied any fevers, body aches or chest pain. She took an at home covid test and it was negative.  ? ?I was going to get her scheduled for an acute visit but she stated that she is currently in [place]. She is not sure of when she will be back in .  ? ?She wanted to know if Dr. Annamaria Boots would be willing to send another round of abx for her. I offered to send the message to the DOD but she stated that she didn't mind waiting until tomorrow to hear from [facility].  ? ?I have updated her pharmacy.  ? ?Dr. Annamaria Boots, can you please advise? Thanks!  ?

## 2022-04-05 ENCOUNTER — Telehealth: Payer: Self-pay | Admitting: Internal Medicine

## 2022-04-05 MED ORDER — BUDESONIDE-FORMOTEROL FUMARATE 160-4.5 MCG/ACT IN AERO: 2.0000 | INHALATION_SPRAY | Freq: Two times a day (BID) | RESPIRATORY_TRACT | 0 refills | Status: DC

## 2022-04-05 MED ORDER — AZITHROMYCIN 250 MG PO TABS: ORAL_TABLET | ORAL | 0 refills | Status: DC

## 2022-04-05 MED ORDER — PREDNISONE 10 MG PO TABS: ORAL_TABLET | ORAL | 0 refills | Status: DC

## 2022-04-05 NOTE — Telephone Encounter (Signed)
Suggest ZPAk 250 mg, # 6  2 today then one daily ? ?               Prednisone 10 mg, # 20, 4 X 2 DAYS, 3 X 2 DAYS, 2 X 2 DAYS, 1 X 2 DAYS ? ?

## 2022-04-05 NOTE — Telephone Encounter (Signed)
Called and spoke with patient. She is aware of CY's response and verbalized understanding.  ? ?RX has been sent in for her.  ? ?Nothing further needed at time of call.  ?

## 2022-04-05 NOTE — Telephone Encounter (Signed)
Called and spoke with pt who states that she is currently still in [place] but said that she has run out of her Symbicort inhaler and needs to have an Rx sent to pharmacy there. Verified preferred pharmacy and have sent Rx for pt. Nothing further needed. ?

## 2022-08-14 IMAGING — CR DG CHEST 2V
2 series · 2 of 2 positions shown · non-contrast
Comparison: October 28, 2019

CLINICAL DATA: Preoperative study for right hip replacement on
January 04, 2021.

EXAM:
CHEST - 2 VIEW

[w chest pa]
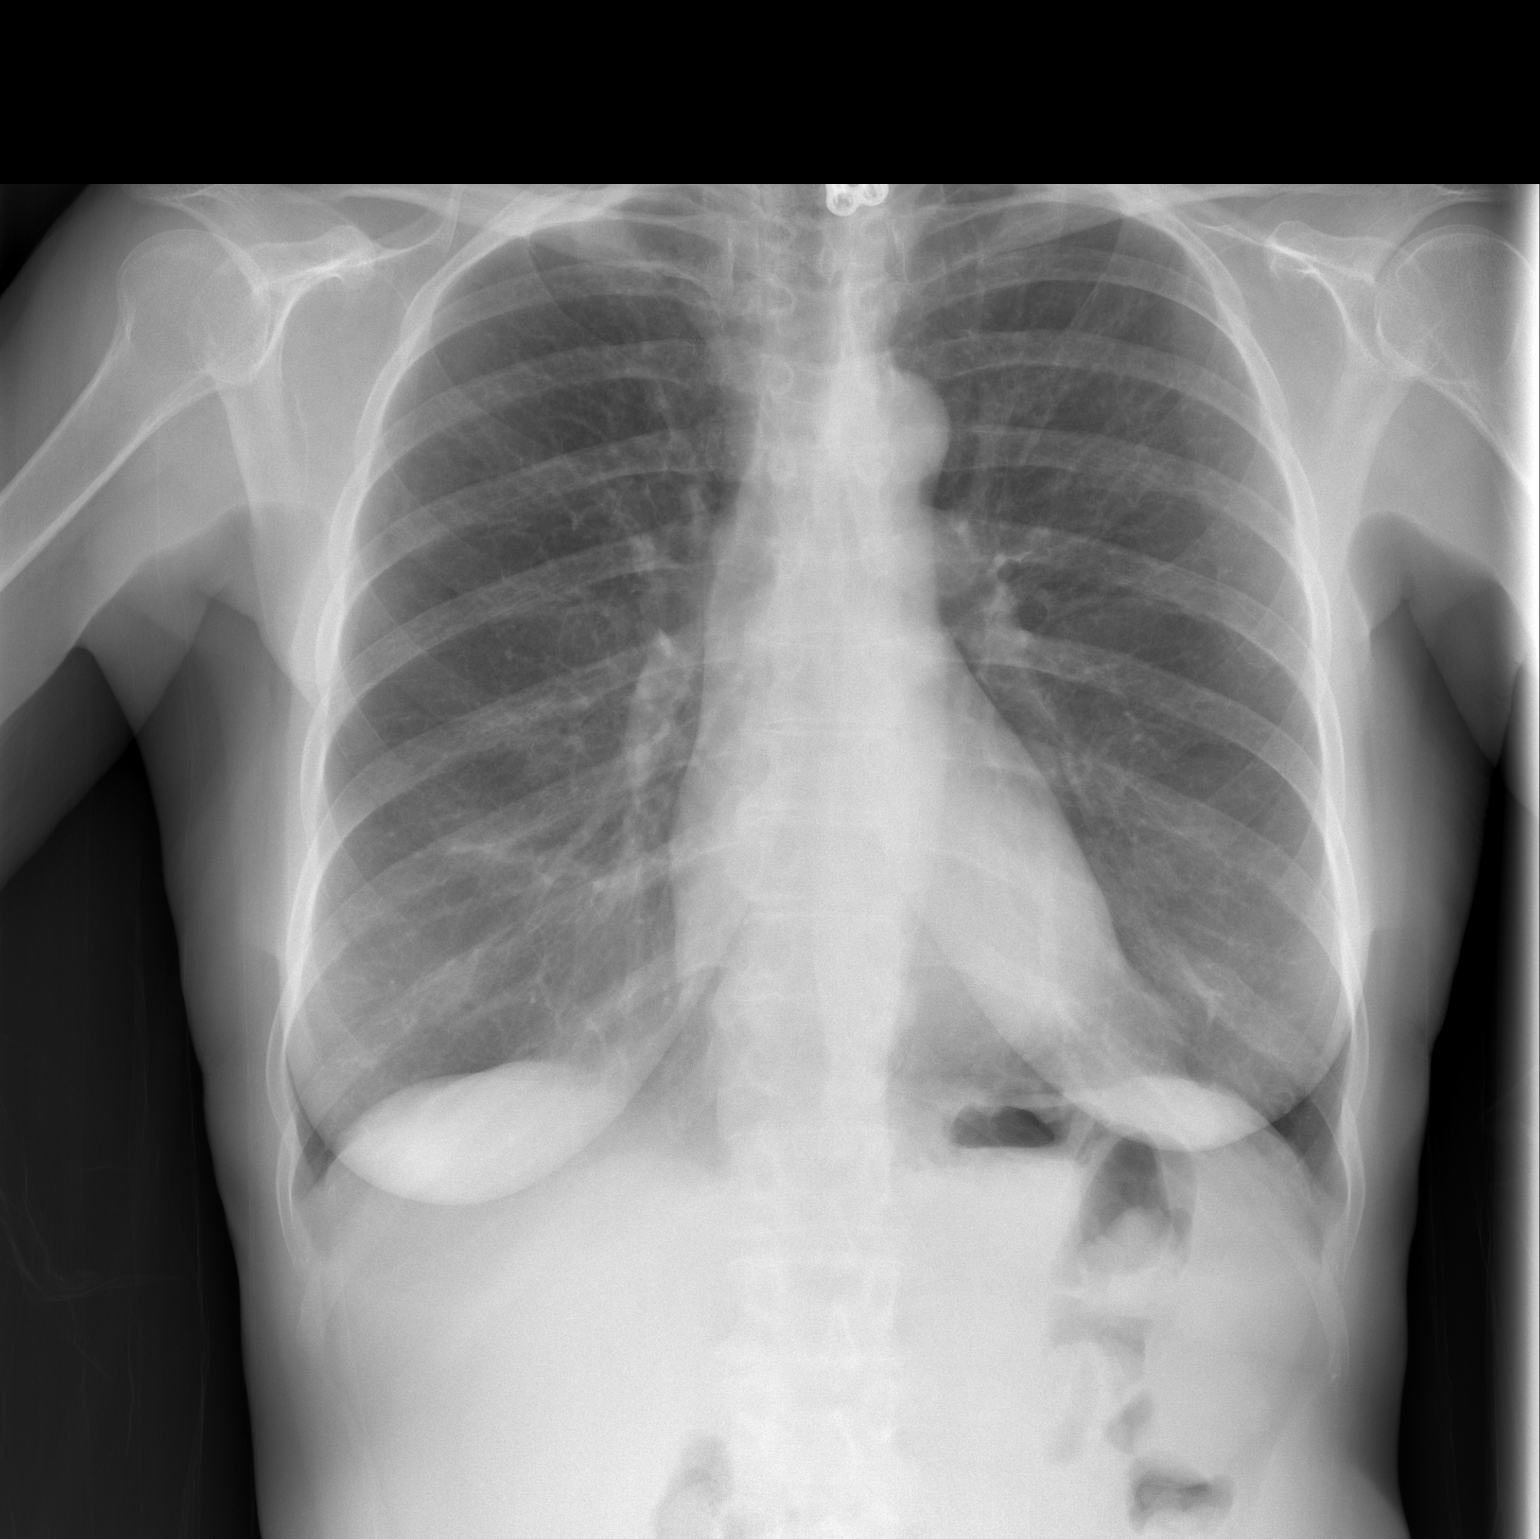

[w chest lat]
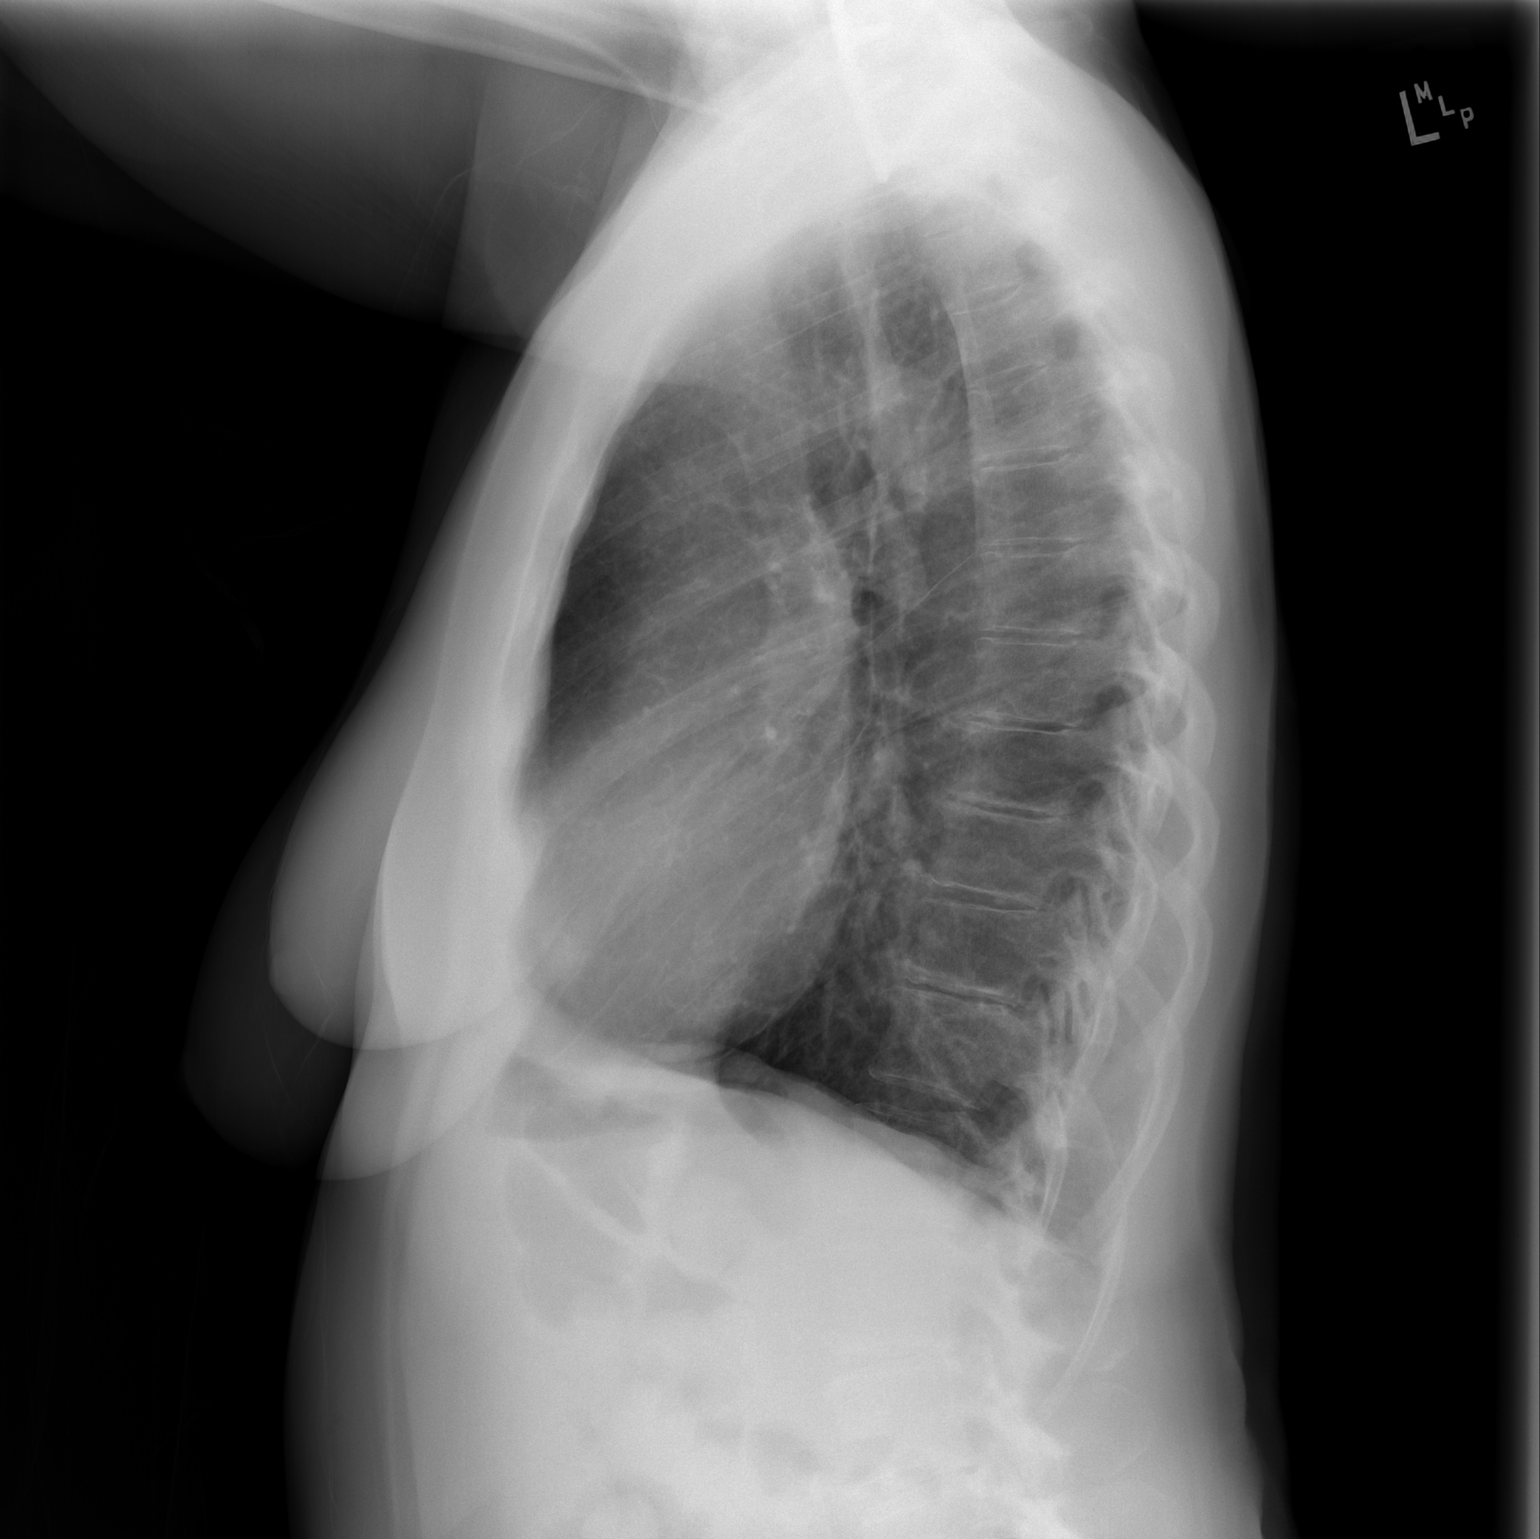

[2 of 2 positions shown; findings below may reference images not displayed]

FINDINGS: The heart, hila, and mediastinum are normal. No pneumothorax. No
nodules or masses. No focal infiltrates.
IMPRESSION: No active cardiopulmonary disease.

## 2022-11-13 ENCOUNTER — Telehealth: Payer: Self-pay | Admitting: Internal Medicine

## 2022-11-13 ENCOUNTER — Other Ambulatory Visit: Payer: Self-pay

## 2022-11-13 MED ORDER — AZITHROMYCIN 250 MG PO TABS: ORAL_TABLET | ORAL | 0 refills | Status: DC

## 2022-11-13 MED ORDER — METHYLPREDNISOLONE 4 MG PO TBPK: ORAL_TABLET | ORAL | 0 refills | Status: AC

## 2022-11-13 NOTE — Telephone Encounter (Signed)
Called Pt to let her know that Dr Annamaria Boots sent her some Medrol '4mg'$  to her pharmacy. Pt stated understanding and nothing furher needed.

## 2022-11-13 NOTE — Telephone Encounter (Signed)
Ok to send Zpak 250 mg, # 6, 2 today then one daily                     Prednisone 10 mg, # 20, 4 X 2 DAYS, 3 X 2 DAYS, 2 X 2 DAYS, 1 X 2 DAYS

## 2022-11-13 NOTE — Telephone Encounter (Signed)
Called Pt to find out her symptoms and how long she had been sick. Advised Pt that I would send a message to Dr Annamaria Boots and call her back once answered. Pt states she has and a cough and congestion x 1 week. Denies Fever or GI issues. Pt states she has tried Mucinex and Theraflu but it is not working. Pt would like an Antibiotic and Prednisone.

## 2022-11-13 NOTE — Telephone Encounter (Signed)
Please d/c prednisone order and instead send  Medrol, 4 mg, # 20, 4 X 2 DAYS, 3 X 2 DAYS, 2 X 2 DAYS, 1 X 2 DAYS

## 2023-01-08 ENCOUNTER — Other Ambulatory Visit: Payer: Self-pay | Admitting: Internal Medicine

## 2023-02-21 ENCOUNTER — Ambulatory Visit (HOSPITAL_COMMUNITY): Payer: Medicare HMO

## 2023-03-08 ENCOUNTER — Telehealth: Payer: Self-pay | Admitting: Internal Medicine

## 2023-03-08 NOTE — Telephone Encounter (Signed)
Called and spoke with [name]. Provided her with the PNA vaccine dates. She verbalized understanding.   Nothing further needed at time of call.

## 2023-03-08 NOTE — Telephone Encounter (Signed)
[name] would like to know when patient got pneumonia vaccine . Patient is at their office now. [name] phone number is [phone].

## 2023-03-20 ENCOUNTER — Encounter: Payer: Self-pay | Admitting: Nurse Practitioner

## 2023-04-05 ENCOUNTER — Other Ambulatory Visit: Payer: Self-pay

## 2023-04-05 DIAGNOSIS — Z87891 Personal history of nicotine dependence: Secondary | ICD-10-CM

## 2023-04-05 DIAGNOSIS — F17210 Nicotine dependence, cigarettes, uncomplicated: Secondary | ICD-10-CM

## 2023-04-20 ENCOUNTER — Ambulatory Visit: Payer: Medicare HMO | Admitting: Primary Care

## 2023-04-30 ENCOUNTER — Ambulatory Visit
Admission: RE | Admit: 2023-04-30 | Discharge: 2023-04-30 | Disposition: A | Discharge status: Home / Self Care | Payer: Medicare HMO | Source: Ambulatory Visit

## 2023-04-30 DIAGNOSIS — Z87891 Personal history of nicotine dependence: Secondary | ICD-10-CM

## 2023-04-30 DIAGNOSIS — F17210 Nicotine dependence, cigarettes, uncomplicated: Secondary | ICD-10-CM

## 2023-05-01 ENCOUNTER — Ambulatory Visit: Payer: Medicare HMO | Admitting: Primary Care

## 2023-05-01 ENCOUNTER — Encounter: Payer: Self-pay | Admitting: Primary Care

## 2023-05-01 VITALS — BP 126/78 | HR 88 | Temp 98.6°F | Ht 64.0 in | Wt 128.6 lb

## 2023-05-01 DIAGNOSIS — J439 Emphysema, unspecified: Secondary | ICD-10-CM | POA: Diagnosis not present

## 2023-05-01 DIAGNOSIS — J4489 Other specified chronic obstructive pulmonary disease: Secondary | ICD-10-CM

## 2023-05-01 MED ORDER — BREZTRI AEROSPHERE 160-9-4.8 MCG/ACT IN AERO: 2.0000 | INHALATION_SPRAY | Freq: Two times a day (BID) | RESPIRATORY_TRACT | 0 refills | Status: DC

## 2023-05-01 NOTE — Progress Notes (Signed)
@Patient  ID: [name], female    DOB: 06/25/1961, 62 y.o.   MRN: [id number]  Chief Complaint  Patient presents with   Follow-up    Would like refill on Breztri.  Doing well.  Some wheeze persistent.    Referring provider: Kaleen Mask, *  HPI: 62 year old female, current every day smoker. Medical history significant for COPD with chronic bronchitis. She is followed by Dr. Vassie Loll, last seen by pulmonary NP on 01/09/22. Maintained on Symbicort and Ventolin Maintenance.   05/01/2023 Patient presents today for overdue follow-up/COPD. She was given Markus Daft during her last office visit in January 2023. She had low dose CT with lung cancer screening program yesterday, results are pending. She uses Symbicort twie dailuy. Stil having shortness of breath symptoms.   Imaging: 02/22/22 LDCT >> Lung-RADS 2, benign appearance or behavior. Continue annual screening with low-dose chest CT without contrast in 12 months. One vessel coronary atherosclerosis. Emphysema.    Allergies  Allergen Reactions   Codeine Nausea Only   Topamax [Topiramate] Hives   Lyrica [Pregabalin] Swelling    Immunization History  Administered Date(s) Administered   Influenza,inj,Quad PF,6+ Mos 11/29/2015, 02/09/2020, 01/09/2022   PFIZER(Purple Top)SARS-COV-2 Vaccination 08/11/2020   Pneumococcal Polysaccharide-23 12/29/2020, 01/09/2022   Tdap 10/23/2012    Past Medical History:  Diagnosis Date   Anxiety    Arthritis    Cancer (HCC)    Cancer of cervix (HCC)    Cervical spondylosis without myelopathy 08/04/2013   COPD (chronic obstructive pulmonary disease) (HCC)    Depression    Dyspnea    on exertion   Fibromyalgia    Migraine without aura, with intractable migraine, so stated, without mention of status migrainosus 12/2020   Osteoporosis     Tobacco History: Social History   Tobacco Use  Smoking Status Every Day   Packs/day: 0.50   Years: 46.00   Additional pack years: 0.00   Total pack  years: 23.00   Types: Cigarettes  Smokeless Tobacco Never  Tobacco Comments   only puffs, does not inhale   Ready to quit: Not Answered Counseling given: Not Answered Tobacco comments: only puffs, does not inhale   Outpatient Medications Prior to Visit  Medication Sig Dispense Refill   albuterol (VENTOLIN HFA) 108 (90 Base) MCG/ACT inhaler INHALE 1 TO 2 PUFFS EVERY 6 HOURS AS NEEDED FOR WHEEZING OR SHORTNESS OF BREATH. 54 g 3   Aspirin-Acetaminophen-Caffeine (GOODY HEADACHE PO) Take 1 packet by mouth daily as needed (headaches).     atorvastatin (LIPITOR) 10 MG tablet Take 10 mg by mouth daily.     azithromycin (ZITHROMAX) 250 MG tablet Take 2 tablets on first day, then 1 tablet daily until finished 6 tablet 0   Budeson-Glycopyrrol-Formoterol (BREZTRI AEROSPHERE) 160-9-4.8 MCG/ACT AERO Inhale 2 puffs into the lungs 2 (two) times daily. 5.9 g 0   budesonide-formoterol (SYMBICORT) 160-4.5 MCG/ACT inhaler Inhale 2 puffs into the lungs 2 (two) times daily. 10.2 each 0   butalbital-acetaminophen-caffeine (FIORICET) 50-325-40 MG tablet Take 1 tablet by mouth 2 (two) times daily as needed for headache or migraine.     docusate sodium (COLACE) 100 MG capsule Take 100 mg by mouth daily as needed for mild constipation.     Erenumab-aooe (AIMOVIG, 140 MG DOSE, [place]) Inject 140 mg into the skin every 30 (thirty) days.      lamoTRIgine (LAMICTAL) 100 MG tablet Take 100 mg by mouth 2 (two) times daily.      mirtazapine (REMERON) 15 MG  tablet Take 15 mg by mouth at bedtime.     nortriptyline (PAMELOR) 75 MG capsule Take 75-150 mg by mouth at bedtime.      ondansetron (ZOFRAN) 4 MG tablet Take 4 mg by mouth every 8 (eight) hours as needed for nausea or vomiting.     Oxycodone HCl 10 MG TABS Take 10 mg by mouth 4 (four) times daily as needed (pain).     pantoprazole (PROTONIX) 40 MG tablet Take 40 mg by mouth daily.     Respiratory Therapy Supplies (FLUTTER) DEVI Use as directed. 1 each 0   Rimegepant  Sulfate (NURTEC) 75 MG TBDP Take 75 mg by mouth daily as needed (migraine).     SUMAtriptan (IMITREX) 100 MG tablet TAKE 1 TABLET (100 MG TOTAL) BY MOUTH AS NEEDED FOR MIGRAINE. (Patient taking differently: Take 50 mg by mouth every 2 (two) hours as needed for migraine.) 9 tablet 0   tiZANidine (ZANAFLEX) 4 MG tablet Take 1 tablet (4 mg total) by mouth 3 (three) times daily. (Patient taking differently: Take 4 mg by mouth 2 (two) times daily.) 90 tablet 0   HYDROmorphone (DILAUDID) 8 MG tablet Take 10 mg by mouth 4 (four) times daily as needed for severe pain. (Patient not taking: Reported on 05/01/2023)     LINZESS 145 MCG CAPS capsule Take 145 mcg by mouth daily as needed for constipation. (Patient not taking: Reported on 05/01/2023)     No facility-administered medications prior to visit.   Review of Systems  Review of Systems  Constitutional: Negative.   HENT: Negative.    Respiratory: Negative.    Cardiovascular: Negative.    Physical Exam  BP 126/78 (BP Location: Left Arm, Patient Position: Sitting, Cuff Size: Normal)   Pulse 88   Temp 98.6 F (37 C) (Oral)   Ht 5\' 4"  (1.626 m)   Wt 128 lb 9.6 oz (58.3 kg)   SpO2 97%   BMI 22.07 kg/m  Physical Exam Constitutional:      General: She is not in acute distress.    Appearance: Normal appearance. She is not ill-appearing.  HENT:     Head: Normocephalic and atraumatic.  Cardiovascular:     Rate and Rhythm: Normal rate and regular rhythm.  Pulmonary:     Effort: Pulmonary effort is normal.     Breath sounds: Normal breath sounds. No wheezing, rhonchi or rales.     Comments: Rhonchi right base Musculoskeletal:        General: Normal range of motion.     Cervical back: Normal range of motion and neck supple.  Skin:    General: Skin is warm and dry.  Neurological:     General: No focal deficit present.     Mental Status: She is alert and oriented to person, place, and time. Mental status is at baseline.  Psychiatric:         Mood and Affect: Mood normal.        Behavior: Behavior normal.        Thought Content: Thought content normal.        Judgment: Judgment normal.      Lab Results:  CBC    Component Value Date/Time   WBC 12.6 (H) 02/01/2021 1453   RBC 4.52 02/01/2021 1453   HGB 13.6 02/01/2021 1453   HCT 42.5 02/01/2021 1453   PLT 385 02/01/2021 1453   MCV 94.0 02/01/2021 1453   MCH 30.1 02/01/2021 1453   MCHC 32.0 02/01/2021 1453  RDW 15.6 (H) 02/01/2021 1453   LYMPHSABS 3.2 02/01/2021 1453   MONOABS 0.7 02/01/2021 1453   EOSABS 0.1 02/01/2021 1453   BASOSABS 0.1 02/01/2021 1453    BMET    Component Value Date/Time   NA 141 02/01/2021 1453   K 4.1 02/01/2021 1453   CL 105 02/01/2021 1453   CO2 27 02/01/2021 1453   GLUCOSE 92 02/01/2021 1453   BUN 19 02/01/2021 1453   CREATININE 0.81 02/01/2021 1453   CALCIUM 9.3 02/01/2021 1453   GFRNONAA >60 02/01/2021 1453   GFRAA  05/23/2009 2036    >60        The eGFR has been calculated using the MDRD equation. This calculation has not been validated in all clinical situations. eGFR's persistently <60 mL/min signify possible Chronic Kidney Disease.    BNP No results found for: "BNP"  ProBNP No results found for: "PROBNP"  Imaging: CT CHEST LUNG CA SCREEN LOW DOSE W/O CM  Result Date: 05/03/2023 CLINICAL DATA:  Current smoker with 37 pack-year history EXAM: CT CHEST WITHOUT CONTRAST LOW-DOSE FOR LUNG CANCER SCREENING TECHNIQUE: Multidetector CT imaging of the chest was performed following the standard protocol without IV contrast. RADIATION DOSE REDUCTION: This exam was performed according to the departmental dose-optimization program which includes automated exposure control, adjustment of the mA and/or kV according to patient size and/or use of iterative reconstruction technique. COMPARISON:  Lung cancer screening CT dated February 21, 2022 FINDINGS: Cardiovascular: Normal heart size. No pericardial effusion. Normal caliber thoracic  aorta with mild calcified plaque. No coronary artery calcifications. Mediastinum/Nodes: Esophagus and thyroid are unremarkable. No enlarged lymph nodes seen in the chest. Lungs/Pleura: Central airways are patent. Mild centrilobular emphysema. No consolidation, pleural effusion or pneumothorax. Irregular solid nodule of the right upper lobe located on series 9, image 109, measuring 12 mm is unchanged in size but marked increased density. Other previously seen nodules are stable Upper Abdomen: No acute abnormality. Musculoskeletal: No chest wall mass or suspicious bone lesions identified. IMPRESSION: 1. Irregular solid nodule of the right upper lobe measuring 12 mm demonstrates marked increased density, previously part solid, highly concerning for primary lung adenocarcinoma. Lung-RADS 4B, suspicious. Additional imaging evaluation or consultation with Pulmonology or Thoracic Surgery recommended. 2. Aortic Atherosclerosis (ICD10-I70.0) and Emphysema (ICD10-J43.9). These results will be called to the ordering clinician or representative by the Radiologist Assistant, and communication documented in the PACS or [employer]. Electronically Signed   By: Allegra Lai M.D.   On: 05/03/2023 07:40    Assessment & Plan:   COPD with chronic bronchitis and emphysema (HCC) - Patient has chronic shortness of breath symptoms without evidence of acute bronchitis/exacerbation. Recommend changing Symbicort to [employer].   Recommendations:  - Stop Symbicort - Start [employer]- take two puffs morning and evening (rinse mouth after use) - Take mucinex 600-1,200mg  twice daily as needed to loosen chest congestion - Taper amount you are smoking and pick quit date/ don't smoke while wearing nicotine patch  - We will send a letter in the mail if LDCT is normal, if abnormal we will call you directly  - Notify office if sputum turns colored or you develop worsening respiratory symptoms   Follow-up: - 3  months with Dr. Vassie Loll or sooner if needed     Glenford Bayley, NP 05/14/2023

## 2023-05-01 NOTE — Patient Instructions (Addendum)
Recommendations:  - Stop Symbicort - Start [employer]- take two puffs morning and evening (rinse mouth after use) - Take mucinex 600-1,200mg  twice daily as needed to loosen chest congestion - Taper amount you are smoking and pick quit date/ don't smoke while wearing nicotine patch  - We will send a letter in the mail if LDCT is normal, if abnormal we will call you directly  - Notify office if sputum turns colored or you develop worsening respiratory symptoms   Follow-up: - 3 months with Dr. Vassie Loll or sooner if needed

## 2023-05-03 ENCOUNTER — Telehealth: Payer: Self-pay | Admitting: Acute Care

## 2023-05-03 NOTE — Telephone Encounter (Signed)
Call report CT 

## 2023-05-03 NOTE — Telephone Encounter (Signed)
I have attempted to call the patient with the results of their  Low Dose CT Chest Lung cancer screening scan. There was no answer. I have left a HIPPA compliant VM requesting the patient call the office for the scan results. I included the office contact information in the message. We will await her return call. If no return call we will continue to call until patient is contacted.    [name], She needs to be added to one of the docs schedules once I get in touch with her. She is in [place], so [place], [place] or [place]. We can also fax results to PCP once she hs been notified. Thanks so much

## 2023-05-03 NOTE — Telephone Encounter (Signed)
Call report on LDCT 04/30/23-   Impression:  IMPRESSION: 1. Irregular solid nodule of the right upper lobe measuring 12 mm demonstrates marked increased density, previously part solid, highly concerning for primary lung adenocarcinoma. Lung-RADS 4B, suspicious. Additional imaging evaluation or consultation with Pulmonology or Thoracic Surgery recommended. 2. Aortic Atherosclerosis (ICD10-I70.0) and Emphysema (ICD10-J43.9).   These results will be called to the ordering clinician or representative by the Radiologist Assistant, and communication documented in the PACS or [employer].     Electronically Signed   By: Allegra Lai M.D.   On: 05/03/2023 07:40   Routing to Maralyn Sago to be called through screening program.

## 2023-05-04 ENCOUNTER — Other Ambulatory Visit: Payer: Self-pay | Admitting: Acute Care

## 2023-05-04 ENCOUNTER — Telehealth: Payer: Self-pay | Admitting: Acute Care

## 2023-05-04 ENCOUNTER — Ambulatory Visit: Payer: Medicare HMO | Admitting: Nurse Practitioner

## 2023-05-04 DIAGNOSIS — R911 Solitary pulmonary nodule: Secondary | ICD-10-CM

## 2023-05-04 NOTE — Telephone Encounter (Signed)
Results/ plans faxed to PCP.  

## 2023-05-04 NOTE — Telephone Encounter (Signed)
Pt. Called back wanting to speak to nurse I  did go ahead and schhed. Her apt with Dr. Thora Lance next week but didn't relay any test results

## 2023-05-04 NOTE — Telephone Encounter (Signed)
Appt with Dr Thora Lance has been cancelled and will be rescheduled once PET scan date has been set.

## 2023-05-04 NOTE — Telephone Encounter (Signed)
I have called the patient with the results of her screening scan. I explained that her scan was read as a Lung RADS 4 B indicates suspicious findings for which additional diagnostic testing and or tissue sampling is recommended.  There is a nodule that has grown and become more dense since her previous scan. She is in agreement with a PET scan. I have ordered the PET scan and she will need follow up with Dr. Thora Lance or Maralyn Sago NP after PET.  Please fax results to PCP and let them know plan is for PET and follow up with pulm MD. Thanks so much

## 2023-05-09 ENCOUNTER — Institutional Professional Consult (permissible substitution): Payer: Medicare HMO | Admitting: Student

## 2023-05-14 NOTE — Assessment & Plan Note (Signed)
-   Patient has chronic shortness of breath symptoms without evidence of acute bronchitis/exacerbation. Recommend changing Symbicort to [employer].

## 2023-05-24 ENCOUNTER — Encounter (HOSPITAL_COMMUNITY)
Admission: RE | Admit: 2023-05-24 | Discharge: 2023-05-24 | Disposition: A | Discharge status: Home / Self Care | Payer: Medicare HMO | Source: Ambulatory Visit | Attending: Acute Care | Admitting: Acute Care

## 2023-05-24 DIAGNOSIS — R911 Solitary pulmonary nodule: Secondary | ICD-10-CM | POA: Insufficient documentation

## 2023-05-24 LAB — GLUCOSE, CAPILLARY: Glucose-Capillary: 105 mg/dL — ABNORMAL HIGH (ref 70–99)

## 2023-05-24 MED ORDER — FLUDEOXYGLUCOSE F - 18 (FDG) INJECTION
6.4000 | Freq: Once | INTRAVENOUS | Status: AC
  Administered 2023-05-24: 6.56 via INTRAVENOUS

## 2023-05-25 ENCOUNTER — Ambulatory Visit: Payer: Medicare HMO | Admitting: Acute Care

## 2023-06-01 ENCOUNTER — Ambulatory Visit: Payer: Medicare HMO | Admitting: Acute Care

## 2023-06-01 ENCOUNTER — Encounter: Payer: Self-pay | Admitting: Pulmonary Disease

## 2023-06-01 ENCOUNTER — Other Ambulatory Visit: Payer: Self-pay | Admitting: Acute Care

## 2023-06-01 ENCOUNTER — Encounter: Payer: Self-pay | Admitting: Acute Care

## 2023-06-01 VITALS — BP 128/84 | HR 90 | Temp 98.2°F | Ht 63.5 in | Wt 129.8 lb

## 2023-06-01 DIAGNOSIS — J441 Chronic obstructive pulmonary disease with (acute) exacerbation: Secondary | ICD-10-CM | POA: Diagnosis not present

## 2023-06-01 DIAGNOSIS — J4489 Other specified chronic obstructive pulmonary disease: Secondary | ICD-10-CM

## 2023-06-01 DIAGNOSIS — F17210 Nicotine dependence, cigarettes, uncomplicated: Secondary | ICD-10-CM

## 2023-06-01 DIAGNOSIS — Z01818 Encounter for other preprocedural examination: Secondary | ICD-10-CM | POA: Diagnosis not present

## 2023-06-01 DIAGNOSIS — R911 Solitary pulmonary nodule: Secondary | ICD-10-CM

## 2023-06-01 DIAGNOSIS — R942 Abnormal results of pulmonary function studies: Secondary | ICD-10-CM

## 2023-06-01 DIAGNOSIS — J439 Emphysema, unspecified: Secondary | ICD-10-CM

## 2023-06-01 MED ORDER — BREZTRI AEROSPHERE 160-9-4.8 MCG/ACT IN AERO
2.00 | INHALATION_SPRAY | Freq: Two times a day (BID) | RESPIRATORY_TRACT | 6 refills | Status: DC
Start: 2023-06-01 — End: 2023-06-29

## 2023-06-01 NOTE — H&P (View-Only) (Signed)
See visit date 06/01/2023  

## 2023-06-01 NOTE — Progress Notes (Signed)
History of Present Illness [name] is a 62 y.o. female current every day smoker with with COPD and chronic bronchitis. She is followed by Dr. Vassie Loll.  HPI: 62 year old female, current every day smoker. Medical history significant for COPD with chronic bronchitis. She is followed by Dr. Vassie Loll, last seen by pulmonary NP on 05/01/2023. Maintained on Symbicort and Ventolin    06/01/2023 Pt. Presents for follow up of abnormal lung cancer screening , and follow up PET scan. PET scan shows 10 mm irregular right upper lobe nodule (series 6/image 27), max SUV 3.1, suspicious for primary bronchogenic carcinoma.No hypermetabolic thoracic lymphadenopathy, which is good. We discussed that we need to biopsy this site to get a definitve tissue diagnosis. She understands the concern is that she may have lung cancer with her smoking history. We have discussed there needs to be a family member with her at the hospital during the procedure and for 24 hours after. We also discussed she needs to hold her aspirin the day before the procedure. She verbalized understanding of the above.She is willing to proceed with bronchoscopy with biopsy.  She is currently doing well on Breztri. She uses her rescue inhaler on average 1-2 times a day.We reviewed signs ans symptoms of a flare and for her to call early with these to allow [place] to treat her. We discussed the need to quit smoking again today. She verbalized understanding     Test Results: PET Scan 05/24/2023 Demonstrated the 10 mm RUL nodule suspicious for primary bronchogenic carcinoma, with an SUV max of 3.1.   LDCT 04/30/2023 Irregular solid nodule of the right upper lobe measuring 12 mm demonstrates marked increased density, previously part solid, highly concerning for primary lung adenocarcinoma. Lung-RADS 4B, suspicious.  02/22/22 LDCT >> Lung-RADS 2, benign appearance or behavior. Continue annual screening with low-dose chest CT without contrast in 12 months. One  vessel coronary atherosclerosis. Emphysema.     Latest Ref Rng & Units 02/01/2021    2:53 PM 12/31/2020    1:35 PM 05/25/2010   12:41 PM  CBC  WBC 4.0 - 10.5 K/uL 12.6  6.6  7.9   Hemoglobin 12.0 - 15.0 g/dL 56.2  13.0  86.5   Hematocrit 36.0 - 46.0 % 42.5  41.8  40.0   Platelets 150 - 400 K/uL 385  381  269        Latest Ref Rng & Units 02/01/2021    2:53 PM 12/31/2020    1:35 PM 05/23/2009    8:36 PM  BMP  Glucose 70 - 99 mg/dL 92  784  72   BUN 8 - 23 mg/dL 19  10  14    Creatinine 0.44 - 1.00 mg/dL 6.96  2.95  2.84   Sodium 135 - 145 mmol/L 141  142  138   Potassium 3.5 - 5.1 mmol/L 4.1  3.9  3.0   Chloride 98 - 111 mmol/L 105  104  107   CO2 22 - 32 mmol/L 27  27  22    Calcium 8.9 - 10.3 mg/dL 9.3  9.5  8.9     BNP No results found for: "BNP"  ProBNP No results found for: "PROBNP"  PFT    Component Value Date/Time   FEV1PRE 1.53 01/28/2020 0913   FEV1POST 1.62 01/28/2020 0913   FVCPRE 2.27 01/28/2020 0913   FVCPOST 2.50 01/28/2020 0913   TLC 5.42 01/28/2020 0913   DLCOUNC 15.22 01/28/2020 0913   PREFEV1FVCRT 67 01/28/2020 0913  PSTFEV1FVCRT 65 01/28/2020 0913    NM PET Image Initial (PI) Skull Base To Thigh  Result Date: 05/30/2023 CLINICAL DATA:  Initial treatment strategy for right upper lobe nodule. EXAM: NUCLEAR MEDICINE PET SKULL BASE TO THIGH TECHNIQUE: 6.6 mCi F-18 FDG was injected intravenously. Full-ring PET imaging was performed from the skull base to thigh after the radiotracer. CT data was obtained and used for attenuation correction and anatomic localization. Fasting blood glucose: 105 mg/dl COMPARISON:  Low-dose lung cancer screening CT chest dated 04/30/2023 FINDINGS: Mediastinal blood pool activity: SUV max 2.4 Liver activity: SUV max NA NECK: No hypermetabolic cervical lymphadenopathy. Incidental CT findings: None. CHEST: 10 mm irregular right upper lobe nodule (series 6/image 27), max SUV 3.1, suspicious for primary bronchogenic carcinoma. No  hypermetabolic thoracic lymphadenopathy. Incidental CT findings: None. ABDOMEN/PELVIS: No abnormal hypermetabolism in the liver, spleen, pancreas, or adrenal glands. No hypermetabolic abdominopelvic lymphadenopathy. Incidental CT findings: Atherosclerotic calcifications of the abdominal aorta and branch vessels. Status post hysterectomy. SKELETON: No focal hypermetabolic activity to suggest skeletal metastasis. Incidental CT findings: Degenerative changes of the visualized thoracolumbar spine. Cervical spine fixation hardware. Right hip arthroplasty. IMPRESSION: 10 mm irregular right upper lobe nodule, suspicious for primary bronchogenic carcinoma. No evidence of metastatic disease. Electronically Signed   By: Charline Bills M.D.   On: 05/30/2023 00:04     Past medical hx Past Medical History:  Diagnosis Date   Anxiety    Arthritis    Cancer (HCC)    Cancer of cervix (HCC)    Cervical spondylosis without myelopathy 08/04/2013   COPD (chronic obstructive pulmonary disease) (HCC)    Depression    Dyspnea    on exertion   Fibromyalgia    Migraine without aura, with intractable migraine, so stated, without mention of status migrainosus 12/2020   Osteoporosis      Social History   Tobacco Use   Smoking status: Every Day    Packs/day: 0.50    Years: 46.00    Additional pack years: 0.00    Total pack years: 23.00    Types: Cigarettes   Smokeless tobacco: Never   Tobacco comments:    only puffs, does not inhale  Vaping Use   Vaping Use: Never used  Substance Use Topics   Alcohol use: Yes    Comment: occas.   Drug use: No    Ms.Kasler reports that she has been smoking cigarettes. She has a 23.00 pack-year smoking history. She has never used smokeless tobacco. She reports current alcohol use. She reports that she does not use drugs.  Tobacco Cessation: Current every day smoker with 37 pack year smoking history  Past surgical hx, Family hx, Social hx all reviewed.  Current  Outpatient Medications on File Prior to Visit  Medication Sig   albuterol (VENTOLIN HFA) 108 (90 Base) MCG/ACT inhaler INHALE 1 TO 2 PUFFS EVERY 6 HOURS AS NEEDED FOR WHEEZING OR SHORTNESS OF BREATH.   Aspirin-Acetaminophen-Caffeine (GOODY HEADACHE PO) Take 1 packet by mouth daily as needed (headaches).   atorvastatin (LIPITOR) 10 MG tablet Take 10 mg by mouth daily.   azithromycin (ZITHROMAX) 250 MG tablet Take 2 tablets on first day, then 1 tablet daily until finished   Budeson-Glycopyrrol-Formoterol (BREZTRI AEROSPHERE) 160-9-4.8 MCG/ACT AERO Inhale 2 puffs into the lungs 2 (two) times daily.   Budeson-Glycopyrrol-Formoterol (BREZTRI AEROSPHERE) 160-9-4.8 MCG/ACT AERO Inhale 2 puffs into the lungs in the morning and at bedtime.   budesonide-formoterol (SYMBICORT) 160-4.5 MCG/ACT inhaler Inhale 2 puffs into the lungs 2 (  two) times daily.   butalbital-acetaminophen-caffeine (FIORICET) 50-325-40 MG tablet Take 1 tablet by mouth 2 (two) times daily as needed for headache or migraine.   docusate sodium (COLACE) 100 MG capsule Take 100 mg by mouth daily as needed for mild constipation.   Erenumab-aooe (AIMOVIG, 140 MG DOSE, [place]) Inject 140 mg into the skin every 30 (thirty) days.    HYDROmorphone (DILAUDID) 8 MG tablet Take 10 mg by mouth 4 (four) times daily as needed for severe pain.   lamoTRIgine (LAMICTAL) 100 MG tablet Take 100 mg by mouth 2 (two) times daily.    LINZESS 145 MCG CAPS capsule Take 145 mcg by mouth daily as needed for constipation.   mirtazapine (REMERON) 15 MG tablet Take 15 mg by mouth at bedtime.   nortriptyline (PAMELOR) 75 MG capsule Take 75-150 mg by mouth at bedtime.    ondansetron (ZOFRAN) 4 MG tablet Take 4 mg by mouth every 8 (eight) hours as needed for nausea or vomiting.   Oxycodone HCl 10 MG TABS Take 10 mg by mouth 4 (four) times daily as needed (pain).   pantoprazole (PROTONIX) 40 MG tablet Take 40 mg by mouth daily.   Respiratory Therapy Supplies (FLUTTER) DEVI Use  as directed.   Rimegepant Sulfate (NURTEC) 75 MG TBDP Take 75 mg by mouth daily as needed (migraine).   SUMAtriptan (IMITREX) 100 MG tablet TAKE 1 TABLET (100 MG TOTAL) BY MOUTH AS NEEDED FOR MIGRAINE. (Patient taking differently: Take 50 mg by mouth every 2 (two) hours as needed for migraine.)   tiZANidine (ZANAFLEX) 4 MG tablet Take 1 tablet (4 mg total) by mouth 3 (three) times daily. (Patient taking differently: Take 4 mg by mouth 2 (two) times daily.)   No current facility-administered medications on file prior to visit.     Allergies  Allergen Reactions   Codeine Nausea Only   Topamax [Topiramate] Hives   Lyrica [Pregabalin] Swelling    Review Of Systems:  Constitutional:   No  weight loss, night sweats,  Fevers, chills, fatigue, or  lassitude.  HEENT:   No headaches,  Difficulty swallowing,  Tooth/dental problems, or  Sore throat,                No sneezing, itching, ear ache, nasal congestion, post nasal drip,   CV:  No chest pain,  Orthopnea, PND, swelling in lower extremities, anasarca, dizziness, palpitations, syncope.   GI  No heartburn, indigestion, abdominal pain, nausea, vomiting, diarrhea, change in bowel habits, loss of appetite, bloody stools.   Resp: + baseline  shortness of breath with exertion less at rest.  + baseline  excess mucus, + baseline  productive cough,  No non-productive cough,  No coughing up of blood.  No change in color of mucus.  + occasional  wheezing.  No chest wall deformity  Skin: no rash or lesions.  GU: no dysuria, change in color of urine, no urgency or frequency.  No flank pain, no hematuria   MS:  No joint pain or swelling.  No decreased range of motion.  No back pain.  Psych:  No change in mood or affect. No depression or anxiety.  No memory loss.   Vital Signs BP 128/84   Pulse 90   Temp 98.2 F (36.8 C) (Oral)   Ht 5' 3.5" (1.613 m)   Wt 129 lb 12.8 oz (58.9 kg)   SpO2 96%   BMI 22.63 kg/m    Physical Exam:  General-  No distress,  A&Ox3,  pleasant ENT: No sinus tenderness, TM clear, pale nasal mucosa, no oral exudate,no post nasal drip, no LAN Cardiac: S1, S2, regular rate and rhythm, no murmur Chest: No wheeze/ rales/ dullness; no accessory muscle use, no nasal flaring, no sternal retractions, prolonged expiratory phase Abd.: Soft Non-tender, ND, BS +, Body mass index is 22.63 kg/m.  Ext: No clubbing cyanosis, edema Neuro:  normal strength, MAE x 4, A&O x 3 Skin: No rashes, warm and dry, no lesions  Psych: normal mood and behavior   Assessment/Plan Abnormal Screening CT Chest Hypermetabolic  PET scan Plan We will plan on biopsy of the nodule in your right middle lobe on 6/24 or 6/25 by Dr. Tonia Brooms.  You will get a letter from our patient care coordinator with more details. Hold aspirin the day of procedure. I will order Pulmonary function tests if we need to once we get results from the biopsy back. Follow up with Me one  week after the biopsy to make sure you are doing well and to get results. Please contact office for sooner follow up if symptoms do not improve or worsen or seek emergency care    COPD with chronic bronchitis and emphysema (HCC) - Patient has chronic shortness of breath symptoms without evidence of acute bronchitis/exacerbation.    Recommendations:  - Continue Breztri Aerosphere- take two puffs morning and evening (rinse mouth after use)  Current Every Day smoker   Plan Call 1-800 QUIT NOW for free nicotine patches gum or mints Work on quitting smoking, I know it is hard.     Follow-up: - 3 months with Dr. Vassie Loll or sooner if needed    I spent 35 minutes dedicated to the care of this patient on the date of this encounter to include pre-visit review of records, face-to-face time with the patient discussing conditions above, post visit ordering of testing, clinical documentation with the electronic health record, making appropriate referrals as documented, and communicating  necessary information to the patient's healthcare team.   Bevelyn Ngo, NP 06/01/2023  11:41 AM   Addendum 07/18/2023. Pt had bronchoscopy done by Dr. Delton Coombes . Patient did not have a post bronchoscopy follow up appointment 1 week after her procedure. Biopsies were + for adenocarcinoma. Dr. Delton Coombes called the patient with the results and referred patient to radiation oncology where she is currently undergoing radiation as treatment for her adenocarcinoma of the lung.   Bevelyn Ngo, MSN, AGACNP-BC [facility] Pulmonary/Critical Care Medicine 07/18/2023

## 2023-06-01 NOTE — Patient Instructions (Addendum)
It is good to see you today. We will plan on biopsy of the nodule in your right middle lobe on 6/24 or 6/25 by Dr. Tonia Brooms.  You will get a letter from our patient care coordinator with more details. Hold aspirin the day of procedure. Continue Breztri 2 puff in the morning and 2 puffs in the evening Rinse mouth after use. Call 1-800 QUIT NOW for free nicotine patches gum or mints Work on quitting smoking, I know it is hard.  I will order Pulmonary function tests if we need to once we get results from the biopsy back. Follow up with Me one  week after the biopsy to make sure you are doing well and to get results. Please contact office for sooner follow up if symptoms do not improve or worsen or seek emergency care

## 2023-06-01 NOTE — Progress Notes (Signed)
See visit date 06/01/2023

## 2023-06-06 ENCOUNTER — Other Ambulatory Visit: Payer: Self-pay | Admitting: Specialist

## 2023-06-06 DIAGNOSIS — M5417 Radiculopathy, lumbosacral region: Secondary | ICD-10-CM

## 2023-06-08 ENCOUNTER — Ambulatory Visit (HOSPITAL_COMMUNITY)
Admission: RE | Admit: 2023-06-08 | Discharge: 2023-06-08 | Disposition: A | Discharge status: Home / Self Care | Payer: Medicare HMO | Source: Ambulatory Visit | Attending: Acute Care | Admitting: Acute Care

## 2023-06-08 DIAGNOSIS — R911 Solitary pulmonary nodule: Secondary | ICD-10-CM | POA: Diagnosis present

## 2023-06-15 ENCOUNTER — Encounter (HOSPITAL_COMMUNITY): Payer: Self-pay | Admitting: Emergency Medicine

## 2023-06-15 ENCOUNTER — Other Ambulatory Visit: Payer: Self-pay

## 2023-06-15 ENCOUNTER — Telehealth: Payer: Self-pay | Admitting: Emergency Medicine

## 2023-06-15 NOTE — Telephone Encounter (Signed)
Pt has already been scheduled for bronch 06/19/23 with Dr.Byrum, nothing further needed

## 2023-06-15 NOTE — Progress Notes (Signed)
PCP - Dr Windle Guard Cardiologist -  n/a  CT Chest x-ray - 06/08/23 EKG - 12/31/20 Stress Test - n/a ECHO - n/a Cardiac Cath - n/a  ICD Pacemaker/Loop - n/a  Sleep Study -  n/a CPAP - none  Diabetes - n/a  Aspirin Instructions: Follow your surgeon's instructions on when to stop aspirin prior to surgery,  If no instructions were given by your surgeon then you will need to call the office for those instructions.  STOP now taking any Aspirin (unless otherwise instructed by your surgeon), Aleve, Naproxen, Ibuprofen, Motrin, Advil, Goody's, BC's, all herbal medications, fish oil, and all vitamins.   Coronavirus Screening Do you have any of the following symptoms:  Cough Yes Fever (>100.71F)  yes/no: No Runny nose yes/no: No Sore throat yes/no: No Difficulty breathing/shortness of breath  Yes  Have you traveled in the last 14 days and where? yes/no: No  Patient verbalized understanding of instructions that were given via phone.

## 2023-06-15 NOTE — Telephone Encounter (Signed)
Order for Pt bronch needs to be put in

## 2023-06-17 ENCOUNTER — Other Ambulatory Visit: Payer: Self-pay | Admitting: Internal Medicine

## 2023-06-19 ENCOUNTER — Ambulatory Visit (HOSPITAL_COMMUNITY)
Admission: RE | Admit: 2023-06-19 | Discharge: 2023-06-19 | Disposition: A | Discharge status: Home / Self Care | Payer: Medicare HMO | Attending: Emergency Medicine | Admitting: Emergency Medicine

## 2023-06-19 ENCOUNTER — Ambulatory Visit (HOSPITAL_BASED_OUTPATIENT_CLINIC_OR_DEPARTMENT_OTHER): Payer: Medicare HMO | Admitting: Anesthesiology

## 2023-06-19 ENCOUNTER — Encounter (HOSPITAL_COMMUNITY)
Admission: RE | Disposition: A | Discharge status: Home / Self Care | Payer: Self-pay | Source: Home / Self Care | Attending: Emergency Medicine

## 2023-06-19 ENCOUNTER — Ambulatory Visit (HOSPITAL_COMMUNITY): Payer: Medicare HMO

## 2023-06-19 ENCOUNTER — Ambulatory Visit (HOSPITAL_COMMUNITY): Payer: Medicare HMO | Admitting: Anesthesiology

## 2023-06-19 ENCOUNTER — Other Ambulatory Visit: Payer: Self-pay

## 2023-06-19 ENCOUNTER — Encounter (HOSPITAL_COMMUNITY): Payer: Self-pay | Admitting: Emergency Medicine

## 2023-06-19 DIAGNOSIS — G43909 Migraine, unspecified, not intractable, without status migrainosus: Secondary | ICD-10-CM | POA: Diagnosis not present

## 2023-06-19 DIAGNOSIS — M797 Fibromyalgia: Secondary | ICD-10-CM | POA: Diagnosis not present

## 2023-06-19 DIAGNOSIS — J449 Chronic obstructive pulmonary disease, unspecified: Secondary | ICD-10-CM

## 2023-06-19 DIAGNOSIS — C3411 Malignant neoplasm of upper lobe, right bronchus or lung: Secondary | ICD-10-CM | POA: Diagnosis not present

## 2023-06-19 DIAGNOSIS — F17210 Nicotine dependence, cigarettes, uncomplicated: Secondary | ICD-10-CM

## 2023-06-19 DIAGNOSIS — M1990 Unspecified osteoarthritis, unspecified site: Secondary | ICD-10-CM | POA: Diagnosis not present

## 2023-06-19 DIAGNOSIS — R911 Solitary pulmonary nodule: Secondary | ICD-10-CM | POA: Diagnosis present

## 2023-06-19 DIAGNOSIS — R9389 Abnormal findings on diagnostic imaging of other specified body structures: Secondary | ICD-10-CM

## 2023-06-19 DIAGNOSIS — K219 Gastro-esophageal reflux disease without esophagitis: Secondary | ICD-10-CM | POA: Insufficient documentation

## 2023-06-19 DIAGNOSIS — F418 Other specified anxiety disorders: Secondary | ICD-10-CM | POA: Insufficient documentation

## 2023-06-19 HISTORY — PX: BRONCHIAL NEEDLE ASPIRATION BIOPSY: SHX5106

## 2023-06-19 HISTORY — DX: Scoliosis, unspecified: M41.9

## 2023-06-19 HISTORY — DX: Gastro-esophageal reflux disease without esophagitis: K21.9

## 2023-06-19 HISTORY — DX: Other intervertebral disc degeneration, lumbar region without mention of lumbar back pain or lower extremity pain: M51.369

## 2023-06-19 HISTORY — DX: Emphysema, unspecified: J43.9

## 2023-06-19 HISTORY — DX: Other intervertebral disc degeneration, lumbar region: M51.36

## 2023-06-19 HISTORY — PX: BRONCHIAL BRUSHINGS: SHX5108

## 2023-06-19 HISTORY — PX: BRONCHIAL WASHINGS: SHX5105

## 2023-06-19 HISTORY — PX: FIDUCIAL MARKER PLACEMENT: SHX6858

## 2023-06-19 HISTORY — PX: BRONCHIAL BIOPSY: SHX5109

## 2023-06-19 LAB — BASIC METABOLIC PANEL
Anion gap: 8 (ref 5–15)
BUN: 16 mg/dL (ref 8–23)
CO2: 24 mmol/L (ref 22–32)
Calcium: 8.7 mg/dL — ABNORMAL LOW (ref 8.9–10.3)
Chloride: 108 mmol/L (ref 98–111)
Creatinine, Ser: 0.78 mg/dL (ref 0.44–1.00)
GFR, Estimated: >60 mL/min (ref >60)
Glucose, Bld: 101 mg/dL — ABNORMAL HIGH (ref 70–99)
Potassium: 3.9 mmol/L (ref 3.5–5.1)
Sodium: 140 mmol/L (ref 135–145)

## 2023-06-19 LAB — CBC
HCT: 40.9 % (ref 36.0–46.0)
Hemoglobin: 13.1 g/dL (ref 12.0–15.0)
MCH: 31.2 pg (ref 26.0–34.0)
MCHC: 32 g/dL (ref 30.0–36.0)
MCV: 97.4 fL (ref 80.0–100.0)
Platelets: 241 10*3/uL (ref 150–400)
RBC: 4.2 MIL/uL (ref 3.87–5.11)
RDW: 15.8 % — ABNORMAL HIGH (ref 11.5–15.5)
WBC: 6.2 10*3/uL (ref 4.0–10.5)
nRBC: 0 % (ref 0.0–0.2)

## 2023-06-19 SURGERY — BRONCHOSCOPY, WITH BIOPSY USING ELECTROMAGNETIC NAVIGATION
Anesthesia: General | Laterality: Right

## 2023-06-19 MED ORDER — HYDROMORPHONE HCL 1 MG/ML IJ SOLN: 0.2500 mg | INTRAMUSCULAR | Status: DC | PRN

## 2023-06-19 MED ORDER — ONDANSETRON HCL 4 MG/2ML IJ SOLN
INTRAMUSCULAR | Status: DC | PRN
  Administered 2023-06-19 (×2): 4 mg via INTRAVENOUS

## 2023-06-19 MED ORDER — AMISULPRIDE (ANTIEMETIC) 5 MG/2ML IV SOLN: 10.0000 mg | Freq: Once | INTRAVENOUS | Status: DC | PRN

## 2023-06-19 MED ORDER — FENTANYL CITRATE (PF) 250 MCG/5ML IJ SOLN
INTRAMUSCULAR | Status: DC | PRN
  Administered 2023-06-19: 50 ug via INTRAVENOUS

## 2023-06-19 MED ORDER — GLYCOPYRROLATE PF 0.2 MG/ML IJ SOSY
PREFILLED_SYRINGE | INTRAMUSCULAR | Status: DC | PRN
  Administered 2023-06-19: .1 mg via INTRAVENOUS

## 2023-06-19 MED ORDER — DEXAMETHASONE SODIUM PHOSPHATE 10 MG/ML IJ SOLN
INTRAMUSCULAR | Status: DC | PRN
  Administered 2023-06-19: 10 mg via INTRAVENOUS

## 2023-06-19 MED ORDER — ACETAMINOPHEN 325 MG PO TABS
650.0000 mg | ORAL_TABLET | Freq: Once | ORAL | Status: AC
  Administered 2023-06-19: 650 mg via ORAL

## 2023-06-19 MED ORDER — CHLORHEXIDINE GLUCONATE 0.12 % MT SOLN: 15.0000 mL | Freq: Once | OROMUCOSAL | Status: AC

## 2023-06-19 MED ORDER — PROPOFOL 10 MG/ML IV BOLUS
INTRAVENOUS | Status: DC | PRN
  Administered 2023-06-19: 120 mg via INTRAVENOUS

## 2023-06-19 MED ORDER — OXYCODONE HCL 5 MG PO TABS: 5.0000 mg | ORAL_TABLET | Freq: Once | ORAL | Status: DC | PRN

## 2023-06-19 MED ORDER — SUCCINYLCHOLINE CHLORIDE 200 MG/10ML IV SOSY
PREFILLED_SYRINGE | INTRAVENOUS | Status: DC | PRN
  Administered 2023-06-19: 140 mg via INTRAVENOUS

## 2023-06-19 MED ORDER — SUGAMMADEX SODIUM 200 MG/2ML IV SOLN
INTRAVENOUS | Status: DC | PRN
  Administered 2023-06-19 (×2): 100 mg via INTRAVENOUS

## 2023-06-19 MED ORDER — PHENYLEPHRINE HCL-NACL 20-0.9 MG/250ML-% IV SOLN
INTRAVENOUS | Status: DC | PRN
  Administered 2023-06-19: 10 ug/min via INTRAVENOUS

## 2023-06-19 MED ORDER — OXYCODONE HCL 5 MG/5ML PO SOLN: 5.0000 mg | Freq: Once | ORAL | Status: DC | PRN

## 2023-06-19 MED ORDER — ROCURONIUM BROMIDE 10 MG/ML (PF) SYRINGE
PREFILLED_SYRINGE | INTRAVENOUS | Status: DC | PRN
  Administered 2023-06-19: 10 mg via INTRAVENOUS
  Administered 2023-06-19: 30 mg via INTRAVENOUS

## 2023-06-19 MED ORDER — CHLORHEXIDINE GLUCONATE 0.12 % MT SOLN
OROMUCOSAL | Status: AC
  Administered 2023-06-19: 15 mL
  Filled 2023-06-19: qty 15

## 2023-06-19 MED ORDER — PROMETHAZINE HCL 25 MG/ML IJ SOLN: 6.2500 mg | INTRAMUSCULAR | Status: DC | PRN

## 2023-06-19 MED ORDER — ACETAMINOPHEN 325 MG PO TABS
ORAL_TABLET | ORAL | Status: AC
  Filled 2023-06-19: qty 2

## 2023-06-19 MED ORDER — LIDOCAINE 2% (20 MG/ML) 5 ML SYRINGE
INTRAMUSCULAR | Status: DC | PRN
  Administered 2023-06-19: 60 mg via INTRAVENOUS

## 2023-06-19 MED ORDER — MEPERIDINE HCL 25 MG/ML IJ SOLN: 6.2500 mg | INTRAMUSCULAR | Status: DC | PRN

## 2023-06-19 MED ORDER — LACTATED RINGERS IV SOLN: INTRAVENOUS | Status: DC

## 2023-06-19 MED ORDER — MIRTAZAPINE 45 MG PO TABS: 45.0000 mg | ORAL_TABLET | Freq: Every day | ORAL | Status: AC

## 2023-06-19 SURGICAL SUPPLY — 1 items: superlock fiducial marker IMPLANT

## 2023-06-19 NOTE — Op Note (Signed)
Video Bronchoscopy with Robotic Assisted Bronchoscopic Navigation   Date of Operation: 06/19/2023   Pre-op Diagnosis: Right upper lobe pulmonary nodule  Post-op Diagnosis: Same  Surgeon: Levy Pupa  Assistants: None  Anesthesia: General endotracheal anesthesia  Operation: Flexible video fiberoptic bronchoscopy with robotic assistance and biopsies.  Estimated Blood Loss: Minimal  Complications: None  Indications and History: [name] is a 62 y.o. female with history of smoking.  She has been noted to have slowly enlarging right upper lobe pulmonary nodule on screening imaging.  Recommendation made to achieve tissue diagnosis via robotic assisted navigational bronchoscopy. The risks, benefits, complications, treatment options and expected outcomes were discussed with the patient.  The possibilities of pneumothorax, pneumonia, reaction to medication, pulmonary aspiration, perforation of a viscus, bleeding, failure to diagnose a condition and creating a complication requiring transfusion or operation were discussed with the patient who freely signed the consent.    Description of Procedure: The patient was seen in the Preoperative Area, was examined and was deemed appropriate to proceed.  The patient was taken to [facility] endoscopy room 3, identified as [name] and the procedure verified as Flexible Video Fiberoptic Bronchoscopy.  A Time Out was held and the above information confirmed.   Prior to the date of the procedure a high-resolution CT scan of the chest was performed. Utilizing ION software program a virtual tracheobronchial tree was generated to allow the creation of distinct navigation pathways to the patient's parenchymal abnormalities. After being taken to the operating room general anesthesia was initiated and the patient  was orally intubated. The video fiberoptic bronchoscope was introduced via the endotracheal tube and a general inspection was performed which showed normal  right and left lung anatomy. Aspiration of the bilateral mainstems was completed to remove any remaining secretions. Robotic catheter inserted into patient's endotracheal tube.   Target #1 right upper lobe pulmonary nodule: The distinct navigation pathways prepared prior to this procedure were then utilized to navigate to patient's lesion identified on CT scan. The robotic catheter was secured into place and the vision probe was withdrawn.  Lesion location was approximated using fluoroscopy and local registration and targeting was performed using CIOs three-dimensional imaging.  Needle-in-lesion was established using Cios. Under fluoroscopic guidance transbronchial needle brushings, transbronchial needle biopsies, and transbronchial forceps biopsies were performed to be sent for cytology and pathology. A bronchioalveolar lavage was performed in the right upper lobe adjacent to the nodule and sent for cytology.  Under fluoroscopic guidance a single fiducial marker was placed adjacent to the nodule  At the end of the procedure a general airway inspection was performed and there was no evidence of active bleeding. The bronchoscope was removed.  The patient tolerated the procedure well. There was no significant blood loss and there were no obvious complications. A post-procedural chest x-ray is pending.  Samples Target #1: 1. Transbronchial needle brushings from right upper lobe nodule 2. Transbronchial Wang needle biopsies from right upper lobe nodule 3. Transbronchial forceps biopsies from right upper lobe nodule 4. Bronchoalveolar lavage from right upper lobe   Plans:  The patient will be discharged from the PACU to home when recovered from anesthesia and after chest x-ray is reviewed. We will review the cytology, pathology and microbiology results with the patient when they become available. Outpatient followup will be with Dr. Tonia Brooms or S. Alexandria Lodge, NP.    Levy Pupa, MD, PhD 06/19/2023, 11:49  AM [facility] [phone] or if no answer before 7:00PM call  [phone] For any issues after 7:00PM please call eLink [phone]

## 2023-06-19 NOTE — Interval H&P Note (Signed)
History and Physical Interval Note:  06/19/2023 9:05 AM  [name]  has presented today for surgery, with the diagnosis of RIGHT UPPER LOBE LUNG NODULE.  The various methods of treatment have been discussed with the patient and family. After consideration of risks, benefits and other options for treatment, the patient has consented to  Procedure(s): ROBOTIC ASSISTED NAVIGATIONAL BRONCHOSCOPY (Right) as a surgical intervention.  The patient's history has been reviewed, patient examined, no change in status, stable for surgery.  I have reviewed the patient's chart and labs.  Questions were answered to the patient's satisfaction.     Leslye Peer

## 2023-06-19 NOTE — Anesthesia Postprocedure Evaluation (Signed)
Anesthesia Post Note  Patient: [name]  Procedure(s) Performed: ROBOTIC ASSISTED NAVIGATIONAL BRONCHOSCOPY (Right) BRONCHIAL NEEDLE ASPIRATION BIOPSIES BRONCHIAL BRUSHINGS BRONCHIAL BIOPSIES FIDUCIAL MARKER PLACEMENT BRONCHIAL WASHINGS     Patient location during evaluation: PACU Anesthesia Type: General Level of consciousness: awake and alert Pain management: pain level controlled Vital Signs Assessment: post-procedure vital signs reviewed and stable Respiratory status: spontaneous breathing, nonlabored ventilation and respiratory function stable Cardiovascular status: blood pressure returned to baseline and stable Postop Assessment: no apparent nausea or vomiting Anesthetic complications: no   No notable events documented.  Last Vitals:  Vitals:   06/19/23 1230 06/19/23 1245  BP: 100/63 115/70  Pulse: 89 97  Resp: 13 13  Temp:  36.8 C  SpO2: 91% 93%    Last Pain:  Vitals:   06/19/23 1205  PainSc: 0-No pain                 Lowella Curb

## 2023-06-19 NOTE — Transfer of Care (Signed)
Immediate Anesthesia Transfer of Care Note  Patient: [name]  Procedure(s) Performed: ROBOTIC ASSISTED NAVIGATIONAL BRONCHOSCOPY (Right) BRONCHIAL NEEDLE ASPIRATION BIOPSIES BRONCHIAL BRUSHINGS BRONCHIAL BIOPSIES FIDUCIAL MARKER PLACEMENT BRONCHIAL WASHINGS  Patient Location: PACU  Anesthesia Type:General  Level of Consciousness: awake, alert , and oriented  Airway & Oxygen Therapy: Patient Spontanous Breathing and Patient connected to nasal cannula oxygen  Post-op Assessment: Report given to RN and Post -op Vital signs reviewed and stable  Post vital signs: Reviewed and stable  Last Vitals:  Vitals Value Taken Time  BP    Temp    Pulse    Resp    SpO2      Last Pain: There were no vitals filed for this visit.       Complications: No notable events documented.

## 2023-06-19 NOTE — Anesthesia Preprocedure Evaluation (Addendum)
Anesthesia Evaluation  Patient identified by MRN, date of birth, ID band Patient awake    Reviewed: Allergy & Precautions, H&P , NPO status , Patient's Chart, lab work & pertinent test results  Airway Mallampati: II  TM Distance: >3 FB Neck ROM: Full    Dental  (+) Edentulous Upper, Edentulous Lower   Pulmonary COPD, Current Smoker   Pulmonary exam normal breath sounds clear to auscultation       Cardiovascular negative cardio ROS Normal cardiovascular exam Rhythm:Regular Rate:Normal     Neuro/Psych  Headaches  Anxiety Depression     negative psych ROS   GI/Hepatic Neg liver ROS,GERD  ,,  Endo/Other  negative endocrine ROS    Renal/GU negative Renal ROS  negative genitourinary   Musculoskeletal  (+) Arthritis , Osteoarthritis,  Fibromyalgia -  Abdominal   Peds negative pediatric ROS (+)  Hematology negative hematology ROS (+)   Anesthesia Other Findings   Reproductive/Obstetrics negative OB ROS                             Anesthesia Physical Anesthesia Plan  ASA: 3  Anesthesia Plan: General   Post-op Pain Management: Minimal or no pain anticipated   Induction: Intravenous  PONV Risk Score and Plan: 2 and Ondansetron, Midazolam and Treatment may vary due to age or medical condition  Airway Management Planned: Oral ETT  Additional Equipment:   Intra-op Plan:   Post-operative Plan: Extubation in OR  Informed Consent: I have reviewed the patients History and Physical, chart, labs and discussed the procedure including the risks, benefits and alternatives for the proposed anesthesia with the patient or authorized representative who has indicated his/her understanding and acceptance.     Dental advisory given  Plan Discussed with: CRNA  Anesthesia Plan Comments:        Anesthesia Quick Evaluation

## 2023-06-19 NOTE — Research (Signed)
Title: A multi-center, prospective, single-arm, observational study to evaluate real-world outcomes for the shape-sensing Ion endoluminal system  Primary Outcome: Evaluate procedure characteristics and short and long-term patient outcomes following shape-sensing robotic-assisted bronchoscopy (ssRAB) utilizing the Ion Endoluminal System for lung lesion localization or biopsy.   Protocol # / Study Name: ISI-ION-003 Clinical Trials #: [id number] Sponsor: [employer] Principal Investigator: Dr. Elige Radon Icard   Key Features of Ion Endoluminal System (referred to as "Ion") Ion is the first FDA cleared bronchoscopy system that uses fiber optic shape sensing technology to inform on location within the airways. Its catheter/tool channel has a smaller outer diameter (3.5 mm) in comparison to conventional bronchoscopes, allowing it to navigate into the smaller airways of the periphery.     Key Inclusion Criteria Subject is 18 years or older at the time of the procedure Subject is a candidate for a planned, elective RAB lung lesion localization or biopsy procedure in which the Ion Endoluminal System is planned to be utilized.  Subject  able to understand and adhere to study requirements and provide informed consent.   Key Exclusion Criteria Subject is under the care of a Museum/gallery exhibitions officer and is unable to provide informed consent on their own accord.  Subject is participating in an interventional research study or research study with investigational agents with an unknown safety profile that would interfere with participation or the results of this study.  Female subjects who are pregnant or nursing at the time of the index Ion procedure, as determined by standard site practices. Subjects that are incarcerated or institutionalized under court order, or other vulnerable populations.    Previous Clinical Trials Since receiving FDA clearance in Feb 2019, Ion has been adopted  commercially by over 226 centers in the [place], and utilized in over 40,000 procedures.  The first in-human study enrolled 24 subjects with a mean lesion size of 14.8 mm and the overall diagnostic yield was 79.3%, with no adverse events. 17 (58.6%) lesions were reported to have a bronchus sign available on CT imaging.  A multi-center study published results in 2022, with 270 lesions biopsied in 241 patients using Ion. The mean largest cardinal lesion size was 18.86.54mm, and the mean airway generation count was 7.01.6. Asymptomatic pneumothorax occurred in 3.3% of subjects, and 0.8% experienced airway bleeding.   Another study provided preliminary results in 2022, with 87% sensitivity for malignancy, a diagnostic yield of 81%, and a mean lesion size of 16 mm. 75% of biopsy cases were bronchus-sign negative. 4% of subjects experienced pneumothoraces (including those requiring intervention), and 0.8% of subjects experienced airway bleeding requiring wedging or balloon tamponade.  A single-center study captured 131 consecutive procedures of pulmonary biopsy using Ion. The navigational success rate was 98.7%, with an overall diagnostic yield of 81.7%, an overall complication rate of 3%, and a pneumothorax rate of 1.5%.    [facility] [place] Clinical Research Coordinator note:   This visit for Subject [name] with DOB: 1960-12-28 on 06/19/2023 for the above protocol is Visit/Encounter # Pre-procedure, intra-procedure and post-procedure  and is for purpose of research.   The consent for this encounter is under:  Protocol Version 1.0 Investigator Brochure Version N/A Consent Version Revision A, dated 14Nov2023 and is currently IRB approved.   [name] expressed continued interest and consent in continuing as a study subject. Subject confirmed that there was no change in contact information (e.g. address, telephone, email). Subject thanked for participation in research and contribution to  science. In this visit 06/19/2023 the subject will be evaluated by Sub-Investigator named Dr. Delton Coombes. This research coordinator has verified that the above investigator is up to date with his/her training logs.   The Subject was informed that the PI continues to have oversight of the subject's visits and course through relevant discussions, reviews, and also specifically of this visit by routing of this note to the PI.  The research study was discussed with the subject in the pre-operative room. The study was explained in detail including all the contents of the informed consent document. The subject was encouraged to ask questions. All questions were answered to their satisfaction. The IRB approved informed consent was signed, and a copy was given to the subject. After obtaining consent, the subject underwent scheduled procedure using the ion endoluminal system. Data collection was completed per protocol. Refer to paper source subject binder for further details.      Signed by  Verdene Lennert Clinical Research Coordinator / Sub-Investigator  [facility]  [place], [place] 1:12 PM 06/19/2023

## 2023-06-19 NOTE — Discharge Instructions (Signed)
Flexible Bronchoscopy, Care After This sheet gives you information about how to care for yourself after your test. Your doctor may also give you more specific instructions. If you have problems or questions, contact your doctor. Follow these instructions at home: Eating and drinking When your numbness is gone and your cough and gag reflexes have come back, you may: Eat only soft foods. Slowly drink liquids. The day after the test, go back to your normal diet. Driving Do not drive for 24 hours if you were given a medicine to help you relax (sedative). Do not drive or use heavy machinery while taking prescription pain medicine. General instructions  Take over-the-counter and prescription medicines only as told by your doctor. Return to your normal activities as told. Ask what activities are safe for you. Do not use any products that have nicotine or tobacco in them. This includes cigarettes and e-cigarettes. If you need help quitting, ask your doctor. Keep all follow-up visits as told by your doctor. This is important. It is very important if you had a tissue sample (biopsy) taken. Get help right away if: You have shortness of breath that gets worse. You get light-headed. You feel like you are going to pass out (faint). You have chest pain. You cough up: More than a little blood. More blood than before. Summary Do not eat or drink anything (not even water) for 2 hours after your test, or until your numbing medicine wears off. Do not use cigarettes. Do not use e-cigarettes. Get help right away if you have chest pain.  Please call our office for any questions or concerns.  [phone].  This information is not intended to replace advice given to you by your health care provider. Make sure you discuss any questions you have with your health care provider. Document Released: 10/08/2009 Document Revised: 11/23/2017 Document Reviewed: 12/29/2016 Elsevier Patient Education  2020 Elsevier  Inc.  

## 2023-06-19 NOTE — Anesthesia Procedure Notes (Signed)
Procedure Name: Intubation Date/Time: 06/19/2023 10:54 AM  Performed by: Loleta Yamilet Mcfayden, CRNAPre-anesthesia Checklist: Patient identified, Patient being monitored, Timeout performed, Emergency Drugs available and Suction available Patient Re-evaluated:Patient Re-evaluated prior to induction Oxygen Delivery Method: Circle system utilized Preoxygenation: Pre-oxygenation with 100% oxygen Induction Type: IV induction Ventilation: Mask ventilation without difficulty Laryngoscope Size: Mac and 3 Grade View: Grade I Tube type: Oral Tube size: 8.5 mm Number of attempts: 1 Airway Equipment and Method: Stylet Placement Confirmation: ETT inserted through vocal cords under direct vision, positive ETCO2 and breath sounds checked- equal and bilateral Secured at: 21 cm Tube secured with: Tape Dental Injury: Teeth and Oropharynx as per pre-operative assessment

## 2023-06-20 ENCOUNTER — Telehealth: Payer: Self-pay | Admitting: Acute Care

## 2023-06-20 NOTE — Telephone Encounter (Signed)
Pt calling in bc she wants to reschedule her appt for her procedure follow up

## 2023-06-21 ENCOUNTER — Telehealth: Payer: Self-pay | Admitting: Emergency Medicine

## 2023-06-21 DIAGNOSIS — C3491 Malignant neoplasm of unspecified part of right bronchus or lung: Secondary | ICD-10-CM

## 2023-06-21 LAB — CYTOLOGY - NON PAP

## 2023-06-21 MED ORDER — DOXYCYCLINE HYCLATE 100 MG PO TABS
100.0000 mg | ORAL_TABLET | Freq: Two times a day (BID) | ORAL | 0 refills | Status: DC
Start: 2023-06-21 — End: 2023-09-17

## 2023-06-21 NOTE — Telephone Encounter (Signed)
Discussed bronchoscopy results with the patient by phone.  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  This looks like early stage disease based on her PET scan.  I will refer her to radiation oncology to discuss SBRT per her request.   Pt complains of soreness in her torso.  Cough with sputum production post bronchoscopy I will call in script for doxycycline for her > 7 days to treat possible bronchitis.  She knows to call if she gets worse in any way

## 2023-06-22 ENCOUNTER — Encounter (HOSPITAL_COMMUNITY): Payer: Self-pay | Admitting: Emergency Medicine

## 2023-06-22 NOTE — Progress Notes (Signed)
Thoracic Location of Tumor / Histology: Adenocarcinoma of right lung   NM PET Image Initial (PI) Skull Base To Thigh 05/24/2023  IMPRESSION: 10 mm irregular right upper lobe nodule, suspicious for primary bronchogenic carcinoma.  CT Super D Chest Wo Contrast 06/08/2023  IMPRESSION: 12 x 10 mm irregular nodule in the lateral right upper lobe, grossly unchanged, corresponding to the patient's suspected primary bronchogenic carcinoma.   No findings suspicious for metastatic disease in the chest.  Patient presented with symptoms of:  found on CT  Biopsies revealed:  06-19-23 FINAL MICROSCOPIC DIAGNOSIS:  A. LUNG, RUL NODULE, FINE NEEDLE ASPIRATION AND BIOPSY:  - Malignant cells consistent with adenocarcinoma   B. LUNG, RUL NODULE, BRUSHING:  - Malignant cells consistent with adenocarcinoma   COMMENT:   Immunohistochemical stains reveal tumor cells are positive for TTF-1 and  negative for p40, consistent with adenocarcinoma.  This case was  reviewed with Dr. Kenard Gower who agrees with the above diagnosis.   06-19-23 FINAL MICROSCOPIC DIAGNOSIS:  - Adenocarcinoma   SPECIMEN ADEQUACY:  Satisfactory for evaluation   Tobacco/Marijuana/Snuff/ETOH use: quit smoking four days ago, states she does not inhale, smoker since age of 89, working with products to quit smoking  Past/Anticipated interventions by cardiothoracic surgery, if any:  Video Bronchoscopy with Robotic Assisted Bronchoscopic Navigation    Date of Operation: 06/19/2023    Pre-op Diagnosis: Right upper lobe pulmonary nodule   Post-op Diagnosis: Same   Surgeon: Levy Pupa   Past/Anticipated interventions by medical oncology, if any: none in EMR  Signs/Symptoms Weight changes, if any: yes, has gained weight Respiratory complaints, if any: yes, cough Hemoptysis, if any: no Pain issues, if any:  yes, chronic back and neck pain  SAFETY ISSUES: Prior radiation? no Pacemaker/ICD? no  Possible current  pregnancy?no Is the patient on methotrexate? no  Current Complaints / other details:  Husband is a smoker as well. Pt emotional about diagnosis stating "I should have quit smoking years ago". She overall wants to know the plan and if she will feel the radiation itself.

## 2023-06-22 NOTE — H&P (Signed)
NAME:  [name], MRN:  [id number], DOB:  03-Jan-1961, LOS: 0 CHIEF COMPLAINT:  abnormal CT chest   History of Present Illness:  62 year old female, current every day smoker. Medical history significant for COPD with chronic bronchitis. She is followed by Dr. Vassie Loll, last seen by pulmonary NP on 05/01/2023. Maintained on Symbicort and Ventolin Maintenance.    06/01/2023 Pt. Presents for follow up of abnormal lung cancer screening , and follow up PET scan. PET scan shows 10 mm irregular right upper lobe nodule (series 6/image 27), max SUV 3.1, suspicious for primary bronchogenic carcinoma.   No hypermetabolic thoracic lymphadenopathy.   Test Results: Irregular solid nodule of the right upper lobe measuring 12 mm demonstrates marked increased density, previously part solid, highly concerning for primary lung adenocarcinoma. Lung-RADS 4B, suspicious. 02/22/22 LDCT >> Lung-RADS 2, benign appearance or behavior. Continue annual screening with low-dose chest CT without contrast in 12 months. One vessel coronary atherosclerosis. Emphysema.  Interval H&P 06/19/23: 62 year old woman with a history of tobacco use, persistent smoking, COPD who has been followed for a slowly enlarging right upper lobe pulmonary nodule on screening imaging.  Recommendation made to pursue a tissue diagnosis.  She presents today for this.  She had a repeat super D CT of the chest on 06/08/2023 that confirmed persistence of a 12 mm irregular lateral right upper lobe pulmonary nodule.  She is doing well, no new issues.  She understands the risk, benefits, rationale of bronchoscopy and agreed to proceed.  Pertinent  Medical History   Past Medical History:  Diagnosis Date   Anxiety    Arthritis    Cancer of cervix (HCC)    Cervical spondylosis without myelopathy 08/04/2013   COPD (chronic obstructive pulmonary disease) (HCC)    DDD (degenerative disc disease), lumbar    Depression    Dyspnea    on exertion   Emphysema of  lung (HCC)    Fibromyalgia    GERD (gastroesophageal reflux disease)    Migraine without aura, with intractable migraine, so stated, without mention of status migrainosus 12/2020   Osteoporosis    Scoliosis      Significant Hospital Events: Including procedures, antibiotic start and stop dates in addition to other pertinent events     Interim History / Subjective:  No new issues reported Minimal cough, she does have some exertional dyspnea  Objective   Blood pressure 115/70, pulse 97, temperature 98.3 F (36.8 C), resp. rate 13, height 5\' 3"  (1.6 m), weight 59.9 kg, SpO2 93 %.       No intake or output data in the 24 hours ending 06/22/23 1313 Filed Weights   06/15/23 1013 06/19/23 0849  Weight: 59.9 kg 59.9 kg    Examination: General: Frail woman, no distress laying in bed HENT: Oropharynx clear, no stridor, strong voice Lungs: Distant, bilateral end expiratory wheezes Cardiovascular: Regular, no murmur Abdomen: Nondistended, positive bowel sounds Extremities: No edema Neuro: Awake, alert, interacting appropriately, nonfocal GU: Deferred   Assessment & Plan:  Slowly enlarging right upper lobe pulmonary nodule in a patient with an active tobacco history.  High suspicion for primary lung cancer.  Plan today is for robotic assisted navigational bronchoscopy under general anesthesia.  The patient understands the risks, benefits and rationale.  She agrees to proceed.  No barriers identified.   Labs   CBC: Recent Labs  Lab 06/19/23 0923  WBC 6.2  HGB 13.1  HCT 40.9  MCV 97.4  PLT 241  Basic Metabolic Panel: Recent Labs  Lab 06/19/23 0923  NA 140  K 3.9  CL 108  CO2 24  GLUCOSE 101*  BUN 16  CREATININE 0.78  CALCIUM 8.7*   GFR: Estimated Creatinine Clearance: 59.5 mL/min (by C-G formula based on SCr of 0.78 mg/dL). Recent Labs  Lab 06/19/23 0923  WBC 6.2    Review of Systems:   As per HPI  Past Medical History:  She,  has a past medical  history of Anxiety, Arthritis, Cancer of cervix (HCC), Cervical spondylosis without myelopathy (08/04/2013), COPD (chronic obstructive pulmonary disease) (HCC), DDD (degenerative disc disease), lumbar, Depression, Dyspnea, Emphysema of lung (HCC), Fibromyalgia, GERD (gastroesophageal reflux disease), Migraine without aura, with intractable migraine, so stated, without mention of status migrainosus (12/2020), Osteoporosis, and Scoliosis.   Surgical History:   Past Surgical History:  Procedure Laterality Date   ABDOMINAL HYSTERECTOMY     BRONCHIAL BIOPSY  06/19/2023   Procedure: BRONCHIAL BIOPSIES;  Surgeon: Leslye Peer, MD;  Location[facility] ENDOSCOPY;  Service: Pulmonary;;   BRONCHIAL BRUSHINGS  06/19/2023   Procedure: BRONCHIAL BRUSHINGS;  Surgeon: Leslye Peer, MD;  Location: [facility] ENDOSCOPY;  Service: Pulmonary;;   BRONCHIAL NEEDLE ASPIRATION BIOPSY  06/19/2023   Procedure: BRONCHIAL NEEDLE ASPIRATION BIOPSIES;  Surgeon: Leslye Peer, MD;  Location: [facility] ENDOSCOPY;  Service: Pulmonary;;   BRONCHIAL WASHINGS  06/19/2023   Procedure: BRONCHIAL WASHINGS;  Surgeon: Leslye Peer, MD;  Location: [facility] ENDOSCOPY;  Service: Pulmonary;;   CARPAL TUNNEL RELEASE Left    CESAREAN SECTION     x 1   COLONOSCOPY     FIDUCIAL MARKER PLACEMENT  06/19/2023   Procedure: FIDUCIAL MARKER PLACEMENT;  Surgeon: Leslye Peer, MD;  Location: [facility] ENDOSCOPY;  Service: Pulmonary;;   left ring finger surgery     w/ pins   LUMBAR LAMINECTOMY     MULTIPLE TOOTH EXTRACTIONS     has full dentures   NECK SURGERY     x 2   TOTAL HIP ARTHROPLASTY Right 02/08/2021   Procedure: RIGHT TOTAL HIP ARTHROPLASTY ANTERIOR APPROACH;  Surgeon: Marcene Corning, MD;  Location: [facility] ORS;  Service: Orthopedics;  Laterality: Right;   TUBAL LIGATION     ULNAR NERVE TRANSPOSITION Left      Social History:   reports that she has been smoking cigarettes. She has a 46.00 pack-year smoking history. She has never used smokeless tobacco. She  reports that she does not currently use alcohol after a past usage of about 7.0 standard drinks of alcohol per week. She reports that she does not use drugs.   Family History:  Her family history includes Cancer in her father and mother.   Allergies Allergies  Allergen Reactions   Codeine Nausea Only   Topamax [Topiramate] Hives   Lyrica [Pregabalin] Swelling     Home Medications  Prior to Admission medications   Medication Sig Start Date End Date Taking? Authorizing Provider  albuterol (VENTOLIN HFA) 108 (90 Base) MCG/ACT inhaler INHALE 1 TO 2 PUFFS EVERY 6 HOURS AS NEEDED FOR WHEEZING OR SHORTNESS OF BREATH. 01/08/23  Yes Young, Clinton D, MD  aspirin EC 81 MG tablet Take 81 mg by mouth daily. Swallow whole.   Yes [provider]  Biotin 1000 MCG CHEW Chew 1,000 mcg by mouth daily.   Yes [provider]  Budeson-Glycopyrrol-Formoterol (BREZTRI AEROSPHERE) 160-9-4.8 MCG/ACT AERO Inhale 2 puffs into the lungs 2 (two) times daily. 06/01/23  Yes Bevelyn Ngo, NP  butalbital-acetaminophen-caffeine [facility]) [phone]  MG tablet Take 1 tablet by mouth 2 (two) times daily as needed for headache or migraine.   Yes [provider]  cyanocobalamin (VITAMIN B12) 1000 MCG tablet Take 2,000 mcg by mouth daily.   Yes [provider]  dextromethorphan-guaiFENesin (MUCINEX DM) 30-600 MG 12hr tablet Take 1 tablet by mouth 2 (two) times daily as needed for cough.   Yes [provider]  diclofenac (VOLTAREN) 75 MG EC tablet Take 75 mg by mouth 2 (two) times daily.   Yes [provider]  diphenhydrAMINE (BENADRYL) 25 mg capsule Take 25 mg by mouth every 6 (six) hours as needed for allergies.   Yes [provider]  Erenumab-aooe (AIMOVIG, 140 MG DOSE, [place]) Inject 140 mg into the skin every 30 (thirty) days.    Yes [provider]  famotidine (PEPCID) 20 MG tablet Take 20 mg by mouth 2 (two) times daily as needed for heartburn or  indigestion. 06/03/23  Yes [provider]  lamoTRIgine (LAMICTAL) 100 MG tablet Take 100 mg by mouth 2 (two) times daily.    Yes [provider]  ondansetron (ZOFRAN) 4 MG tablet Take 4 mg by mouth every 8 (eight) hours as needed for nausea or vomiting.   Yes [provider]  Oxycodone HCl 10 MG TABS Take 10 mg by mouth every 6 (six) hours. 12/27/20  Yes [provider]  SUMAtriptan (IMITREX) 100 MG tablet TAKE 1 TABLET (100 MG TOTAL) BY MOUTH AS NEEDED FOR MIGRAINE. Patient taking differently: Take 100 mg by mouth every 2 (two) hours as needed for migraine. 01/11/15  Yes York Spaniel, MD  tiZANidine (ZANAFLEX) 4 MG tablet Take 1 tablet (4 mg total) by mouth 3 (three) times daily. 12/29/14  Yes York Spaniel, MD  doxycycline (VIBRA-TABS) 100 MG tablet Take 1 tablet (100 mg total) by mouth 2 (two) times daily. 06/21/23   Leslye Peer, MD  mirtazapine (REMERON) 45 MG tablet Take 1 tablet (45 mg total) by mouth at bedtime. 06/19/23   Leslye Peer, MD  Respiratory Therapy Supplies (FLUTTER) DEVI Use as directed. 02/09/20   Bevelyn Ngo, NP     Levy Pupa, MD, PhD 06/22/2023, 1:13 PM [facility] [phone] or if no answer before 7:00PM call [phone] For any issues after 7:00PM please call eLink [phone]

## 2023-06-26 ENCOUNTER — Encounter: Payer: Self-pay | Admitting: Radiation Oncology

## 2023-06-26 ENCOUNTER — Telehealth: Payer: Self-pay

## 2023-06-26 NOTE — Telephone Encounter (Signed)
Rn called pt to obtain meaningful use and nurse evaluation information. Note completed and routed to Dr. Roselind Messier.

## 2023-06-26 NOTE — Progress Notes (Signed)
Radiation Oncology         [phone] ________________________________  Initial Outpatient Consultation  Name: ALLIS KEA MRN: [id number]  Date: 06/27/2023  DOB: 06/12/1961  JY:NWGNFA, Curly Rim, MD  Leslye Peer, MD   REFERRING PHYSICIAN: Leslye Peer, MD  DIAGNOSIS: The encounter diagnosis was Malignant neoplasm of right upper lobe of lung (HCC).  Adenocarcinoma of the right upper lobe  HISTORY OF PRESENT ILLNESS::Nerine K Bernabei is a 62 y.o. female who is accompanied by her husband. she is seen as a courtesy of Dr. Delton Coombes for an opinion concerning radiation therapy as part of management for her recently diagnosed right lung cancer. Prior to her recent diagnosis, the patient was already established with [facility] in the setting of COPD with chronic bronchitis. She has a current smoker with a 37 pack year history.   In the setting of her extensive smoking history, the patient participates in the lung cancer screening program. As such, she presented for a lung cancer screening CT on 04/30/23 which demonstrated a 12 mm irregular solid nodule of the right upper lobe, with features highly concerning for primary lung adenocarcinoma. The RUL nodule was previously demonstrated on a lung cancer screening CT from 02/21/22, and appeared benign in appearance and behavior at that time.   A PET scan was subsequently performed on 05/24/23 which demonstrated the 10 mm RUL nodule suspicious for primary bronchogenic carcinoma, with an SUV max of 3.1.   A super D-chest CT was also performed on 06/08/23 which showed stability of the 12 x 10 mm irregular nodule in the lateral right upper lobe. CT otherwise showed no evidence of metastatic disease in the chest.   She was accordingly referred to Dr. Delton Coombes and underwent bronchoscopy with biopsies on 06/19/23. Biopsies of the RUL collected showed findings consistent with adenocarcinoma. Cytology from RUL lavage also showed finding consistent with  adenocarcinoma.   She recently stopped smoking.    PREVIOUS RADIATION THERAPY: No  PAST MEDICAL HISTORY:  Past Medical History:  Diagnosis Date   Anxiety    Arthritis    Cancer of cervix (HCC)    Cervical spondylosis without myelopathy 08/04/2013   COPD (chronic obstructive pulmonary disease) (HCC)    DDD (degenerative disc disease), lumbar    Depression    Dyspnea    on exertion   Emphysema of lung (HCC)    Fibromyalgia    GERD (gastroesophageal reflux disease)    Migraine without aura, with intractable migraine, so stated, without mention of status migrainosus 12/2020   Osteoporosis    Scoliosis     PAST SURGICAL HISTORY: Past Surgical History:  Procedure Laterality Date   ABDOMINAL HYSTERECTOMY     BRONCHIAL BIOPSY  06/19/2023   Procedure: BRONCHIAL BIOPSIES;  Surgeon: Leslye Peer, MD;  Location: [facility];  Service: Pulmonary;;   BRONCHIAL BRUSHINGS  06/19/2023   Procedure: BRONCHIAL BRUSHINGS;  Surgeon: Leslye Peer, MD;  Location: [facility] ENDOSCOPY;  Service: Pulmonary;;   BRONCHIAL NEEDLE ASPIRATION BIOPSY  06/19/2023   Procedure: BRONCHIAL NEEDLE ASPIRATION BIOPSIES;  Surgeon: Leslye Peer, MD;  Location: [facility];  Service: Pulmonary;;   BRONCHIAL WASHINGS  06/19/2023   Procedure: BRONCHIAL WASHINGS;  Surgeon: Leslye Peer, MD;  Location: [facility];  Service: Pulmonary;;   CARPAL TUNNEL RELEASE Left    CESAREAN SECTION     x 1   COLONOSCOPY     FIDUCIAL MARKER PLACEMENT  06/19/2023   Procedure: FIDUCIAL MARKER PLACEMENT;  Surgeon: Delton Coombes,  Les Pou, MD;  Location: [facility] ENDOSCOPY;  Service: Pulmonary;;   left ring finger surgery     w/ pins   LUMBAR LAMINECTOMY     MULTIPLE TOOTH EXTRACTIONS     has full dentures   NECK SURGERY     x 2   TOTAL HIP ARTHROPLASTY Right 02/08/2021   Procedure: RIGHT TOTAL HIP ARTHROPLASTY ANTERIOR APPROACH;  Surgeon: Marcene Corning, MD;  Location: [facility];  Service: Orthopedics;  Laterality: Right;   TUBAL LIGATION      ULNAR NERVE TRANSPOSITION Left     FAMILY HISTORY:  Family History  Problem Relation Age of Onset   Cancer Father    Cancer Mother     SOCIAL HISTORY:  Social History   Tobacco Use   Smoking status: Every Day    Packs/day: 1.00    Years: 46.00    Additional pack years: 0.00    Total pack years: 46.00    Types: Cigarettes   Smokeless tobacco: Never   Tobacco comments:    only puffs, does not inhale  Vaping Use   Vaping Use: Never used  Substance Use Topics   Alcohol use: Not Currently    Alcohol/week: 7.0 standard drinks of alcohol    Types: 7 Standard drinks or equivalent per week    Comment: beer/mixed drinks   Drug use: No    ALLERGIES:  Allergies  Allergen Reactions   Codeine Nausea Only   Topamax [Topiramate] Hives   Lyrica [Pregabalin] Swelling    MEDICATIONS:  Current Outpatient Medications  Medication Sig Dispense Refill   albuterol (VENTOLIN HFA) 108 (90 Base) MCG/ACT inhaler INHALE 1 TO 2 PUFFS EVERY 6 HOURS AS NEEDED FOR WHEEZING OR SHORTNESS OF BREATH. 54 g 3   aspirin EC 81 MG tablet Take 81 mg by mouth daily. Swallow whole.     Biotin 1000 MCG CHEW Chew 1,000 mcg by mouth daily.     Budeson-Glycopyrrol-Formoterol (BREZTRI AEROSPHERE) 160-9-4.8 MCG/ACT AERO Inhale 2 puffs into the lungs 2 (two) times daily. 5.9 g 6   butalbital-acetaminophen-caffeine (FIORICET) 50-325-40 MG tablet Take 1 tablet by mouth 2 (two) times daily as needed for headache or migraine.     cyanocobalamin (VITAMIN B12) 1000 MCG tablet Take 2,000 mcg by mouth daily.     dextromethorphan-guaiFENesin (MUCINEX DM) 30-600 MG 12hr tablet Take 1 tablet by mouth 2 (two) times daily as needed for cough.     diclofenac (VOLTAREN) 75 MG EC tablet Take 75 mg by mouth 2 (two) times daily.     diphenhydrAMINE (BENADRYL) 25 mg capsule Take 25 mg by mouth every 6 (six) hours as needed for allergies.     doxycycline (VIBRA-TABS) 100 MG tablet Take 1 tablet (100 mg total) by mouth 2 (two) times  daily. 14 tablet 0   Erenumab-aooe (AIMOVIG, 140 MG DOSE, Maries) Inject 140 mg into the skin every 30 (thirty) days.      famotidine (PEPCID) 20 MG tablet Take 20 mg by mouth 2 (two) times daily as needed for heartburn or indigestion.     lamoTRIgine (LAMICTAL) 100 MG tablet Take 100 mg by mouth 2 (two) times daily.      mirtazapine (REMERON) 45 MG tablet Take 1 tablet (45 mg total) by mouth at bedtime.     ondansetron (ZOFRAN) 4 MG tablet Take 4 mg by mouth every 8 (eight) hours as needed for nausea or vomiting.     Oxycodone HCl 10 MG TABS Take 10 mg by mouth every 6 (  six) hours.     Respiratory Therapy Supplies (FLUTTER) DEVI Use as directed. 1 each 0   SUMAtriptan (IMITREX) 100 MG tablet TAKE 1 TABLET (100 MG TOTAL) BY MOUTH AS NEEDED FOR MIGRAINE. (Patient taking differently: Take 100 mg by mouth every 2 (two) hours as needed for migraine.) 9 tablet 0   tiZANidine (ZANAFLEX) 4 MG tablet Take 1 tablet (4 mg total) by mouth 3 (three) times daily. 90 tablet 0   No current facility-administered medications for this encounter.    REVIEW OF SYSTEMS:  A 10+ POINT REVIEW OF SYSTEMS WAS OBTAINED including neurology, dermatology, psychiatry, cardiac, respiratory, lymph, extremities, GI, GU, musculoskeletal, constitutional, reproductive, HEENT.  She reports some soreness along the right chest from her biopsy procedure.  She denies any any hemoptysis.  She is on a prescription of doxycycline for bronchitis.  She denies any use of supplemental oxygen at home.   PHYSICAL EXAM:  height is 5\' 3"  (1.6 m) and weight is 134 lb 4 oz (60.9 kg). Her temporal temperature is 97.7 F (36.5 C). Her blood pressure is 163/75 (abnormal) and her pulse is 67. Her respiration is 18 and oxygen saturation is 98%.   General: Alert and oriented, in no acute distress HEENT: Head is normocephalic. Extraocular movements are intact.  Neck: Neck is supple, no palpable cervical or supraclavicular lymphadenopathy. Heart: Regular in  rate and rhythm with no murmurs, rubs, or gallops. Chest: Clear to auscultation bilaterally, with no rhonchi, wheezes, or rales. Abdomen: Soft, nontender, nondistended, with no rigidity or guarding. Extremities: No cyanosis or edema. Lymphatics: see Neck Exam Skin: No concerning lesions. Musculoskeletal: symmetric strength and muscle tone throughout. Neurologic: Cranial nerves II through XII are grossly intact. No obvious focalities. Speech is fluent. Coordination is intact. Psychiatric: Judgment and insight are intact. Affect is appropriate.   ECOG = 1  0 - Asymptomatic (Fully active, able to carry on all predisease activities without restriction)  1 - Symptomatic but completely ambulatory (Restricted in physically strenuous activity but ambulatory and able to carry out work of a light or sedentary nature. For example, light housework, office work)  2 - Symptomatic, <50% in bed during the day (Ambulatory and capable of all self care but unable to carry out any work activities. Up and about more than 50% of waking hours)  3 - Symptomatic, >50% in bed, but not bedbound (Capable of only limited self-care, confined to bed or chair 50% or more of waking hours)  4 - Bedbound (Completely disabled. Cannot carry on any self-care. Totally confined to bed or chair)  5 - Death   Santiago Glad MM, Creech RH, Tormey DC, et al. [phone]). "Toxicity and response criteria of the [facility]". Am. Evlyn Clines. Oncol. 5 (6): 649-55  LABORATORY DATA:  Lab Results  Component Value Date   WBC 6.2 06/19/2023   HGB 13.1 06/19/2023   HCT 40.9 06/19/2023   MCV 97.4 06/19/2023   PLT 241 06/19/2023   NEUTROABS 8.4 (H) 02/01/2021   Lab Results  Component Value Date   NA 140 06/19/2023   K 3.9 06/19/2023   CL 108 06/19/2023   CO2 24 06/19/2023   GLUCOSE 101 (H) 06/19/2023   BUN 16 06/19/2023   CREATININE 0.78 06/19/2023   CALCIUM 8.7 (L) 06/19/2023      RADIOGRAPHY: DG Chest Port 1  View  Result Date: 06/19/2023 CLINICAL DATA:  Status post bronchoscopic biopsy EXAM: PORTABLE CHEST 1 VIEW COMPARISON:  CT chest done on 06/08/2023 FINDINGS: Cardiac  size is within normal limits. There is a metallic density in the lateral aspect of right mid lung field, possibly fiduciary marker at the site of biopsy. Small patchy densities are seen adjacent to this marker, possibly bleeding adjacent to the biopsy site. Small linear densities seen in left lower lung field which may be due to crowding of markings caused by elevation of left hemidiaphragm or subsegmental atelectasis. There is no pleural effusion or pneumothorax. There is previous surgical fusion in cervical spine. IMPRESSION: There is no pneumothorax. There are faint increased markings in the lateral aspect of right mid lung field, possibly contusion/hemorrhage in the lung adjacent to biopsy site. Electronically Signed   By: Ernie Avena M.D.   On: 06/19/2023 12:45   DG C-ARM BRONCHOSCOPY  Result Date: 06/19/2023 C-ARM BRONCHOSCOPY: Fluoroscopy was utilized by the requesting physician.  No radiographic interpretation.   CT Super D Chest Wo Contrast  Result Date: 06/15/2023 CLINICAL DATA:  Follow-up pulmonary nodule EXAM: CT CHEST WITHOUT CONTRAST TECHNIQUE: Multidetector CT imaging of the chest was performed using thin slice collimation for electromagnetic bronchoscopy planning purposes, without intravenous contrast. RADIATION DOSE REDUCTION: This exam was performed according to the departmental dose-optimization program which includes automated exposure control, adjustment of the mA and/or kV according to patient size and/or use of iterative reconstruction technique. COMPARISON:  PET-CT dated 05/24/2023 FINDINGS: Cardiovascular: The heart is normal in size. No pericardial effusion. Mild atherosclerotic calcifications of the aortic arch. No evidence of thoracic aortic aneurysm. Mediastinum/Nodes: No suspicious mediastinal  lymphadenopathy. Visualized thyroid is unremarkable. Lungs/Pleura: 12 x 10 mm irregular nodule in the lateral right upper lobe (series 5/image 58), grossly unchanged. No focal consolidation. 4 mm triangular subpleural nodule in the left lower lobe (series 5/image 107), likely reflecting a benign subpleural lymph node. Mild centrilobular emphysematous changes, upper lung predominant. No pleural effusion or pneumothorax. Upper Abdomen: Visualized upper abdomen is grossly unremarkable, noting vascular calcifications. Musculoskeletal: Mild degenerative changes of the visualized thoracolumbar spine. Cervical spine fixation hardware, incompletely visualized. IMPRESSION: 12 x 10 mm irregular nodule in the lateral right upper lobe, grossly unchanged, corresponding to the patient's suspected primary bronchogenic carcinoma. No findings suspicious for metastatic disease in the chest. Aortic Atherosclerosis (ICD10-I70.0) and Emphysema (ICD10-J43.9). Electronically Signed   By: Charline Bills M.D.   On: 06/15/2023 00:28      IMPRESSION: Adenocarcinoma of the right upper lobe, clinical stage I  We reviewed the x-ray findings as well as pathology report in detail.  She appears to have a clinical stage I non-small cell lung cancer presenting in the right upper lobe.  We discussed options for treatment including SBRT as well as surgery.  In light of her young age I recommended she meet with cardiothoracic surgery to see if she may be a candidate for surgery.  As above she has recently stopped smoking.  She has been scheduled for repeat pulmonary function studies   Today I ordered a brain MRI to complete staging workup.  We discussed the technique of SBRT in detail and compared that to surgical intervention.  We discussed the anticipated course of treatment as well as potential long-term toxicities of SBRT directed at the right upper lung region.  .  She is interested in meeting with cardiothoracic surgery for further  discussion of surgery for management of her early-stage lung cancer.  At this point she seems to be leaning towards SBRT but will make her final decision after meeting with surgery.  PLAN: Brain MRI, pulmonary function studies, cardiothoracic  consultation.  After the patient has met with surgery she will make a final determination concerning management of her early-stage lung cancer.   60 minutes of total time was spent for this patient encounter, including preparation, face-to-face counseling with the patient and coordination of care, physical exam, and documentation of the encounter.    ------------------------------------------------  Billie Lade, PhD, MD  This document serves as a record of services personally performed by Antony Blackbird, MD. It was created on his behalf by Neena Rhymes, a trained medical scribe. The creation of this record is based on the scribe's personal observations and the provider's statements to them. This document has been checked and approved by the attending provider.

## 2023-06-27 ENCOUNTER — Other Ambulatory Visit: Payer: Self-pay

## 2023-06-27 ENCOUNTER — Other Ambulatory Visit: Payer: Self-pay | Admitting: Acute Care

## 2023-06-27 ENCOUNTER — Ambulatory Visit
Admission: RE | Admit: 2023-06-27 | Discharge: 2023-06-27 | Disposition: A | Discharge status: Home / Self Care | Payer: Medicare HMO | Source: Ambulatory Visit | Attending: Radiation Oncology | Admitting: Radiation Oncology

## 2023-06-27 VITALS — BP 163/75 | HR 67 | Temp 97.7°F | Resp 18 | Ht 63.0 in | Wt 134.2 lb

## 2023-06-27 DIAGNOSIS — J432 Centrilobular emphysema: Secondary | ICD-10-CM | POA: Insufficient documentation

## 2023-06-27 DIAGNOSIS — I700 Atherosclerosis of aorta: Secondary | ICD-10-CM | POA: Insufficient documentation

## 2023-06-27 DIAGNOSIS — Z79899 Other long term (current) drug therapy: Secondary | ICD-10-CM | POA: Diagnosis not present

## 2023-06-27 DIAGNOSIS — C3411 Malignant neoplasm of upper lobe, right bronchus or lung: Secondary | ICD-10-CM | POA: Diagnosis present

## 2023-06-27 DIAGNOSIS — M810 Age-related osteoporosis without current pathological fracture: Secondary | ICD-10-CM | POA: Diagnosis not present

## 2023-06-27 DIAGNOSIS — Z8541 Personal history of malignant neoplasm of cervix uteri: Secondary | ICD-10-CM | POA: Insufficient documentation

## 2023-06-27 DIAGNOSIS — F17210 Nicotine dependence, cigarettes, uncomplicated: Secondary | ICD-10-CM | POA: Diagnosis not present

## 2023-06-27 DIAGNOSIS — M5136 Other intervertebral disc degeneration, lumbar region: Secondary | ICD-10-CM | POA: Insufficient documentation

## 2023-06-27 DIAGNOSIS — K219 Gastro-esophageal reflux disease without esophagitis: Secondary | ICD-10-CM | POA: Diagnosis not present

## 2023-06-27 DIAGNOSIS — M797 Fibromyalgia: Secondary | ICD-10-CM | POA: Diagnosis not present

## 2023-06-27 DIAGNOSIS — Z791 Long term (current) use of non-steroidal anti-inflammatories (NSAID): Secondary | ICD-10-CM | POA: Diagnosis not present

## 2023-06-27 DIAGNOSIS — J439 Emphysema, unspecified: Secondary | ICD-10-CM | POA: Insufficient documentation

## 2023-06-27 DIAGNOSIS — C3490 Malignant neoplasm of unspecified part of unspecified bronchus or lung: Secondary | ICD-10-CM

## 2023-06-27 NOTE — Telephone Encounter (Signed)
Appt has been cancelled. Results were discussed with pt by phone by Dr Delton Coombes.

## 2023-06-29 ENCOUNTER — Ambulatory Visit: Payer: Medicare HMO | Admitting: Acute Care

## 2023-06-29 ENCOUNTER — Telehealth: Payer: Self-pay | Admitting: Acute Care

## 2023-06-29 DIAGNOSIS — J441 Chronic obstructive pulmonary disease with (acute) exacerbation: Secondary | ICD-10-CM

## 2023-06-29 MED ORDER — BREZTRI AEROSPHERE 160-9-4.8 MCG/ACT IN AERO
2.0000 | INHALATION_SPRAY | Freq: Two times a day (BID) | RESPIRATORY_TRACT | 6 refills | Status: DC
Start: 2023-06-29 — End: 2024-05-13

## 2023-06-29 NOTE — Telephone Encounter (Signed)
Pt is looking for her Breztri refill. Pharmacy is [facility]

## 2023-06-29 NOTE — Telephone Encounter (Signed)
Spoke with patient. Advised Breztri inhaler has been sent to her pharmacy. NFN ATT

## 2023-07-03 ENCOUNTER — Telehealth: Payer: Self-pay

## 2023-07-03 ENCOUNTER — Telehealth: Payer: Self-pay | Admitting: *Deleted

## 2023-07-03 NOTE — Telephone Encounter (Signed)
Per Dr. Roselind Messier patient can cancel cardiothoracic appointment and pulmonary function test. Patient to have brain MRI. Once MRI has been completed patient will be scheduled for CT simulation and start radiation treatments. Patient voiced understanding.

## 2023-07-03 NOTE — Telephone Encounter (Signed)
Patient called in stating that she does not want to have   cardiothoracic surgery, she wants to start with radiation treatment asap.Patient states she does not feel like she could make it through surgery after having difficult biopsy. Patient asking if she should cancel upcoming brain MRI and cardiothoracic appointment? Patient to have pulmonary function test on 07/04/23. Pls advise.

## 2023-07-03 NOTE — Telephone Encounter (Signed)
Called patient to inform of MRI for 07-11-23- arrival time- 6:30 pm @ [facility], no restrictions to scan , patient to check in @ ED, spoke with patient and she is aware of this scan and the instructions

## 2023-07-04 ENCOUNTER — Inpatient Hospital Stay (HOSPITAL_COMMUNITY)
Admission: RE | Admit: 2023-07-04 | Discharge: 2023-07-04 | Disposition: A | Discharge status: Home / Self Care | Payer: Medicare HMO | Source: Ambulatory Visit | Attending: Acute Care | Admitting: Acute Care

## 2023-07-04 ENCOUNTER — Other Ambulatory Visit: Payer: Medicare HMO

## 2023-07-05 ENCOUNTER — Other Ambulatory Visit: Payer: Self-pay

## 2023-07-06 ENCOUNTER — Encounter: Payer: Medicare HMO | Admitting: Thoracic Surgery (Cardiothoracic Vascular Surgery)

## 2023-07-06 NOTE — Progress Notes (Signed)
The proposed treatment discussed in conference is for discussion purpose only and is not a binding recommendation.  The patients have not been physically examined, or presented with their treatment options.  Therefore, final treatment plans cannot be decided.  

## 2023-07-11 ENCOUNTER — Ambulatory Visit (HOSPITAL_COMMUNITY)
Admission: RE | Admit: 2023-07-11 | Discharge: 2023-07-11 | Disposition: A | Discharge status: Home / Self Care | Payer: Medicare HMO | Source: Ambulatory Visit | Attending: Radiation Oncology | Admitting: Radiation Oncology

## 2023-07-11 DIAGNOSIS — C3411 Malignant neoplasm of upper lobe, right bronchus or lung: Secondary | ICD-10-CM | POA: Insufficient documentation

## 2023-07-11 MED ORDER — GADOBUTROL 1 MMOL/ML IV SOLN
6.0000 mL | Freq: Once | INTRAVENOUS | Status: AC | PRN
  Administered 2023-07-11: 6 mL via INTRAVENOUS

## 2023-07-18 ENCOUNTER — Encounter: Payer: Self-pay | Admitting: Acute Care

## 2023-07-23 ENCOUNTER — Other Ambulatory Visit: Payer: Self-pay

## 2023-07-23 ENCOUNTER — Ambulatory Visit
Admission: RE | Admit: 2023-07-23 | Discharge: 2023-07-23 | Disposition: A | Discharge status: Home / Self Care | Payer: Medicare HMO | Source: Ambulatory Visit | Attending: Radiation Oncology | Admitting: Radiation Oncology

## 2023-07-27 ENCOUNTER — Ambulatory Visit
Admission: RE | Admit: 2023-07-27 | Discharge: 2023-07-27 | Disposition: A | Discharge status: Home / Self Care | Payer: Medicare HMO | Source: Ambulatory Visit | Attending: Radiation Oncology | Admitting: Radiation Oncology

## 2023-07-27 DIAGNOSIS — C3411 Malignant neoplasm of upper lobe, right bronchus or lung: Secondary | ICD-10-CM | POA: Diagnosis present

## 2023-08-01 NOTE — Progress Notes (Deleted)
History of Present Illness [name] is a 62 y.o. female current every day smoker with with COPD and chronic bronchitis. She is followed by Dr. Vassie Loll.  HPI: 62 year old female, current every day smoker. Medical history significant for COPD with chronic bronchitis. She is followed by Dr. Vassie Loll, last seen by pulmonary NP on 05/01/2023. Maintained on Symbicort and Ventolin    08/01/2023 Pt. Presents for follow up of abnormal lung cancer screening , and follow up PET scan. PET scan shows 10 mm irregular right upper lobe nodule (series 6/image 27), max SUV 3.1, suspicious for primary bronchogenic carcinoma.No hypermetabolic thoracic lymphadenopathy, which is good. We discussed that we need to biopsy this site to get a definitve tissue diagnosis. She understands the concern is that she may have lung cancer with her smoking history. We have discussed there needs to be a family member with her at the hospital during the procedure and for 24 hours after. We also discussed she needs to hold her aspirin the day before the procedure. She verbalized understanding of the above.She is willing to proceed with bronchoscopy with biopsy.  She is currently doing well on Breztri. She uses her rescue inhaler on average 1-2 times a day.We reviewed signs ans symptoms of a flare and for her to call early with these to allow [place] to treat her. We discussed the need to quit smoking again today. She verbalized understanding   T Scan 05/24/2023 Demonstrated the 10 mm RUL nodule suspicious for primary bronchogenic carcinoma, with an SUV max of 3.1.   LDCT 04/30/2023 Irregular solid nodule of the right upper lobe measuring 12 mm demonstrates marked increased density, previously part solid, highly concerning for primary lung adenocarcinoma. Lung-RADS 4B, suspicious.  02/22/22 LDCT >> Lung-RADS 2, benign appearance or behavior. Continue annual screening with low-dose chest CT without contrast in 12 months. One vessel coronary  atherosclerosis. Emphysema.     Latest Ref Rng & Units 06/19/2023    9:23 AM 02/01/2021    2:53 PM 12/31/2020    1:35 PM  CBC  WBC 4.0 - 10.5 K/uL 6.2  12.6  6.6   Hemoglobin 12.0 - 15.0 g/dL 72.5  36.6  44.0   Hematocrit 36.0 - 46.0 % 40.9  42.5  41.8   Platelets 150 - 400 K/uL 241  385  381        Latest Ref Rng & Units 06/19/2023    9:23 AM 02/01/2021    2:53 PM 12/31/2020    1:35 PM  BMP  Glucose 70 - 99 mg/dL 347  92  425   BUN 8 - 23 mg/dL 16  19  10    Creatinine 0.44 - 1.00 mg/dL 9.56  3.87  5.64   Sodium 135 - 145 mmol/L 140  141  142   Potassium 3.5 - 5.1 mmol/L 3.9  4.1  3.9   Chloride 98 - 111 mmol/L 108  105  104   CO2 22 - 32 mmol/L 24  27  27    Calcium 8.9 - 10.3 mg/dL 8.7  9.3  9.5     BNP No results found for: "BNP"  ProBNP No results found for: "PROBNP"  PFT    Component Value Date/Time   FEV1PRE 1.53 01/28/2020 0913   FEV1POST 1.62 01/28/2020 0913   FVCPRE 2.27 01/28/2020 0913   FVCPOST 2.50 01/28/2020 0913   TLC 5.42 01/28/2020 0913   DLCOUNC 15.22 01/28/2020 0913   PREFEV1FVCRT 67 01/28/2020 0913   PSTFEV1FVCRT 65  01/28/2020 0913    MR Brain W Wo Contrast  Result Date: 07/11/2023 CLINICAL DATA:  Provided history: Malignant neoplasm of right upper lobe of lung. Non-small cell lung cancer, staging. EXAM: MRI HEAD WITHOUT AND WITH CONTRAST TECHNIQUE: Multiplanar, multiecho pulse sequences of the brain and surrounding structures were obtained without and with intravenous contrast. CONTRAST:  6mL GADAVIST GADOBUTROL 1 MMOL/ML IV SOLN COMPARISON:  Head CT 05/23/2009. FINDINGS: Brain: No age advanced or lobar predominant parenchymal atrophy. Mild multifocal T2 FLAIR hyperintense signal abnormality within the cerebral white matter, nonspecific but most often secondary to chronic small vessel ischemia. There is no acute infarct. No evidence of an intracranial mass. No chronic intracranial blood products. No extra-axial fluid collection. No midline shift. No  pathologic intracranial enhancement identified. Vascular: Maintained flow voids within the proximal large arterial vessels. Dominant left vertebral artery. Skull and upper cervical spine: No focal suspicious marrow lesion. Susceptibility artifact at the level of the cervical spine, presumably arising from ACDF hardware. Incompletely assessed cervical spondylosis. Sinuses/Orbits: No mass or acute finding within the imaged orbits. Minimal mucosal thickening within the left sphenoid sinus. Other: Trace fluid within the right mastoid air cells. IMPRESSION: 1. No evidence of intracranial metastatic disease. 2. Mild multifocal T2 FLAIR hyperintense signal abnormality within the cerebral white matter, nonspecific but most often secondary to chronic small vessel ischemia. Electronically Signed   By: Jackey Loge D.O.   On: 07/11/2023 19:30     Past medical hx Past Medical History:  Diagnosis Date   Anxiety    Arthritis    Cancer of cervix (HCC)    Cervical spondylosis without myelopathy 08/04/2013   COPD (chronic obstructive pulmonary disease) (HCC)    DDD (degenerative disc disease), lumbar    Depression    Dyspnea    on exertion   Emphysema of lung (HCC)    Fibromyalgia    GERD (gastroesophageal reflux disease)    Migraine without aura, with intractable migraine, so stated, without mention of status migrainosus 12/2020   Osteoporosis    Scoliosis      Social History   Tobacco Use   Smoking status: Every Day    Current packs/day: 1.00    Average packs/day: 1 pack/day for 46.0 years (46.0 ttl pk-yrs)    Types: Cigarettes   Smokeless tobacco: Never   Tobacco comments:    only puffs, does not inhale  Vaping Use   Vaping status: Never Used  Substance Use Topics   Alcohol use: Not Currently    Alcohol/week: 7.0 standard drinks of alcohol    Types: 7 Standard drinks or equivalent per week    Comment: beer/mixed drinks   Drug use: No    [name] reports that she has been smoking  cigarettes. She has a 46 pack-year smoking history. She has never used smokeless tobacco. She reports that she does not currently use alcohol after a past usage of about 7.0 standard drinks of alcohol per week. She reports that she does not use drugs.  Tobacco Cessation: Current every day smoker with 37 pack year smoking history  Past surgical hx, Family hx, Social hx all reviewed.  Current Outpatient Medications on File Prior to Visit  Medication Sig   albuterol (VENTOLIN HFA) 108 (90 Base) MCG/ACT inhaler INHALE 1 TO 2 PUFFS EVERY 6 HOURS AS NEEDED FOR WHEEZING OR SHORTNESS OF BREATH.   aspirin EC 81 MG tablet Take 81 mg by mouth daily. Swallow whole.   Biotin 1000 MCG CHEW Chew 1,000 mcg by  mouth daily.   Budeson-Glycopyrrol-Formoterol (BREZTRI AEROSPHERE) 160-9-4.8 MCG/ACT AERO Inhale 2 puffs into the lungs 2 (two) times daily.   butalbital-acetaminophen-caffeine (FIORICET) 50-325-40 MG tablet Take 1 tablet by mouth 2 (two) times daily as needed for headache or migraine.   cyanocobalamin (VITAMIN B12) 1000 MCG tablet Take 2,000 mcg by mouth daily.   dextromethorphan-guaiFENesin (MUCINEX DM) 30-600 MG 12hr tablet Take 1 tablet by mouth 2 (two) times daily as needed for cough.   diclofenac (VOLTAREN) 75 MG EC tablet Take 75 mg by mouth 2 (two) times daily.   diphenhydrAMINE (BENADRYL) 25 mg capsule Take 25 mg by mouth every 6 (six) hours as needed for allergies.   doxycycline (VIBRA-TABS) 100 MG tablet Take 1 tablet (100 mg total) by mouth 2 (two) times daily.   Erenumab-aooe (AIMOVIG, 140 MG DOSE, [place]) Inject 140 mg into the skin every 30 (thirty) days.    famotidine (PEPCID) 20 MG tablet Take 20 mg by mouth 2 (two) times daily as needed for heartburn or indigestion.   lamoTRIgine (LAMICTAL) 100 MG tablet Take 100 mg by mouth 2 (two) times daily.    mirtazapine (REMERON) 45 MG tablet Take 1 tablet (45 mg total) by mouth at bedtime.   ondansetron (ZOFRAN) 4 MG tablet Take 4 mg by mouth every  8 (eight) hours as needed for nausea or vomiting.   Oxycodone HCl 10 MG TABS Take 10 mg by mouth every 6 (six) hours.   Respiratory Therapy Supplies (FLUTTER) DEVI Use as directed.   SUMAtriptan (IMITREX) 100 MG tablet TAKE 1 TABLET (100 MG TOTAL) BY MOUTH AS NEEDED FOR MIGRAINE. (Patient taking differently: Take 100 mg by mouth every 2 (two) hours as needed for migraine.)   tiZANidine (ZANAFLEX) 4 MG tablet Take 1 tablet (4 mg total) by mouth 3 (three) times daily.   No current facility-administered medications on file prior to visit.     Allergies  Allergen Reactions   Codeine Nausea Only   Topamax [Topiramate] Hives   Lyrica [Pregabalin] Swelling    Review Of Systems:  Constitutional:   No  weight loss, night sweats,  Fevers, chills, fatigue, or  lassitude.  HEENT:   No headaches,  Difficulty swallowing,  Tooth/dental problems, or  Sore throat,                No sneezing, itching, ear ache, nasal congestion, post nasal drip,   CV:  No chest pain,  Orthopnea, PND, swelling in lower extremities, anasarca, dizziness, palpitations, syncope.   GI  No heartburn, indigestion, abdominal pain, nausea, vomiting, diarrhea, change in bowel habits, loss of appetite, bloody stools.   Resp: + baseline  shortness of breath with exertion less at rest.  + baseline  excess mucus, + baseline  productive cough,  No non-productive cough,  No coughing up of blood.  No change in color of mucus.  + occasional  wheezing.  No chest wall deformity  Skin: no rash or lesions.  GU: no dysuria, change in color of urine, no urgency or frequency.  No flank pain, no hematuria   MS:  No joint pain or swelling.  No decreased range of motion.  No back pain.  Psych:  No change in mood or affect. No depression or anxiety.  No memory loss.   Vital Signs There were no vitals taken for this visit.   Physical Exam:  General- No distress,  A&Ox3, pleasant ENT: No sinus tenderness, TM clear, pale nasal mucosa, no  oral exudate,no post nasal  drip, no LAN Cardiac: S1, S2, regular rate and rhythm, no murmur Chest: No wheeze/ rales/ dullness; no accessory muscle use, no nasal flaring, no sternal retractions, prolonged expiratory phase Abd.: Soft Non-tender, ND, BS +, There is no height or weight on file to calculate BMI.  Ext: No clubbing cyanosis, edema Neuro:  normal strength, MAE x 4, A&O x 3 Skin: No rashes, warm and dry, no lesions  Psych: normal mood and behavior   Assessment/Plan Abnormal Screening CT Chest Hypermetabolic  PET scan Plan We will plan on biopsy of the nodule in your right middle lobe on 6/24 or 6/25 by Dr. Tonia Brooms.  You will get a letter from our patient care coordinator with more details. Hold aspirin the day of procedure. I will order Pulmonary function tests if we need to once we get results from the biopsy back. Follow up with Me one  week after the biopsy to make sure you are doing well and to get results. Please contact office for sooner follow up if symptoms do not improve or worsen or seek emergency care    COPD with chronic bronchitis and emphysema (HCC) - Patient has chronic shortness of breath symptoms without evidence of acute bronchitis/exacerbation.    Recommendations:  - Continue Breztri Aerosphere- take two puffs morning and evening (rinse mouth after use)  Current Every Day smoker   Plan Call 1-800 QUIT NOW for free nicotine patches gum or mints Work on quitting smoking, I know it is hard.     Follow-up: - 3 months with Dr. Vassie Loll or sooner if needed    I spent 35 minutes dedicated to the care of this patient on the date of this encounter to include pre-visit review of records, face-to-face time with the patient discussing conditions above, post visit ordering of testing, clinical documentation with the electronic health record, making appropriate referrals as documented, and communicating necessary information to the patient's healthcare team.   Addendum  07/18/2023. Pt had bronchoscopy done by Dr. Delton Coombes . Patient did not have a post bronchoscopy follow up appointment 1 week after her procedure. Biopsies were + for adenocarcinoma. Dr. Delton Coombes called the patient with the results and referred patient to radiation oncology where she is currently undergoing radiation as treatment for her adenocarcinoma of the lung.   Bevelyn Ngo, MSN, AGACNP-BC [facility] 08/01/2023  08/02/23- 1 yoF  current every day Smoker. Medical history significant for COPD with chronic bronchitis, AdenoCA R?UL. She is followed by Dr. Vassie Loll, last seen by pulmonary NP on 05/01/2023. Maintained on Symbicort and Ventolin  Bronchoscopy RUL lung nodule by Dr Delton Coombes 06/19/23> AdenoCA

## 2023-08-02 ENCOUNTER — Ambulatory Visit: Payer: Medicare HMO | Admitting: Internal Medicine

## 2023-08-07 DIAGNOSIS — C3411 Malignant neoplasm of upper lobe, right bronchus or lung: Secondary | ICD-10-CM | POA: Diagnosis not present

## 2023-08-08 ENCOUNTER — Ambulatory Visit: Payer: Medicare HMO | Admitting: Radiation Oncology

## 2023-08-09 ENCOUNTER — Ambulatory Visit: Payer: Medicare HMO | Admitting: Radiation Oncology

## 2023-08-10 ENCOUNTER — Ambulatory Visit: Payer: Medicare HMO | Admitting: Radiation Oncology

## 2023-08-13 ENCOUNTER — Other Ambulatory Visit: Payer: Self-pay

## 2023-08-13 ENCOUNTER — Ambulatory Visit: Admission: RE | Admit: 2023-08-13 | Payer: Medicare HMO | Source: Ambulatory Visit | Admitting: Radiation Oncology

## 2023-08-13 DIAGNOSIS — C3411 Malignant neoplasm of upper lobe, right bronchus or lung: Secondary | ICD-10-CM

## 2023-08-13 LAB — RAD ONC ARIA SESSION SUMMARY
Course Elapsed Days: 0
Plan Fractions Treated to Date: 1
Plan Prescribed Dose Per Fraction: 18 Gy
Plan Total Fractions Prescribed: 3
Plan Total Prescribed Dose: 54 Gy
Reference Point Dosage Given to Date: 18 Gy
Reference Point Session Dosage Given: 18 Gy
Session Number: 1

## 2023-08-14 ENCOUNTER — Ambulatory Visit: Payer: Medicare HMO | Admitting: Radiation Oncology

## 2023-08-15 ENCOUNTER — Other Ambulatory Visit: Payer: Self-pay

## 2023-08-15 ENCOUNTER — Ambulatory Visit: Payer: Medicare HMO | Admitting: Radiation Oncology

## 2023-08-15 ENCOUNTER — Ambulatory Visit
Admission: RE | Admit: 2023-08-15 | Discharge: 2023-08-15 | Disposition: A | Discharge status: Home / Self Care | Payer: Medicare HMO | Source: Ambulatory Visit | Attending: Radiation Oncology | Admitting: Radiation Oncology

## 2023-08-15 DIAGNOSIS — C3411 Malignant neoplasm of upper lobe, right bronchus or lung: Secondary | ICD-10-CM | POA: Diagnosis not present

## 2023-08-15 LAB — RAD ONC ARIA SESSION SUMMARY
Course Elapsed Days: 2
Plan Fractions Treated to Date: 2
Plan Prescribed Dose Per Fraction: 18 Gy
Plan Total Fractions Prescribed: 3
Plan Total Prescribed Dose: 54 Gy
Reference Point Dosage Given to Date: 36 Gy
Reference Point Session Dosage Given: 18 Gy
Session Number: 2

## 2023-08-17 ENCOUNTER — Ambulatory Visit: Payer: Medicare HMO | Admitting: Radiation Oncology

## 2023-08-20 ENCOUNTER — Ambulatory Visit
Admission: RE | Admit: 2023-08-20 | Discharge: 2023-08-20 | Disposition: A | Discharge status: Home / Self Care | Payer: Medicare HMO | Source: Ambulatory Visit | Attending: Radiation Oncology | Admitting: Radiation Oncology

## 2023-08-20 ENCOUNTER — Other Ambulatory Visit: Payer: Self-pay

## 2023-08-20 DIAGNOSIS — C3411 Malignant neoplasm of upper lobe, right bronchus or lung: Secondary | ICD-10-CM | POA: Diagnosis not present

## 2023-08-20 LAB — RAD ONC ARIA SESSION SUMMARY
Course Elapsed Days: 7
Plan Fractions Treated to Date: 3
Plan Prescribed Dose Per Fraction: 18 Gy
Plan Total Fractions Prescribed: 3
Plan Total Prescribed Dose: 54 Gy
Reference Point Dosage Given to Date: 54 Gy
Reference Point Session Dosage Given: 18 Gy
Session Number: 3

## 2023-08-21 NOTE — Radiation Completion Notes (Signed)
Patient Name: [name], [name] MRN: [id number] Date of Birth: [birth year] Referring Physician: Levy Pupa, M.D. Date of Service: 2023-08-21 Radiation Oncologist: Arnette Schaumann, M.D. [facility]                             RADIATION ONCOLOGY END OF TREATMENT NOTE     Diagnosis: C34.11 Malignant neoplasm of upper lobe, right bronchus or lung Intent: Curative     ==========DELIVERED PLANS==========  First Treatment Date: 2023-08-13 - Last Treatment Date: 2023-08-20   Plan Name: Lung_R_SBRT Site: Lung, Right Technique: SBRT/SRT-IMRT Mode: Photon Dose Per Fraction: 18 Gy Prescribed Dose (Delivered / Prescribed): 54 Gy / 54 Gy Prescribed Fxs (Delivered / Prescribed): 3 / 3     ==========ON TREATMENT VISIT DATES========== 2023-08-13, 2023-08-15, 2023-08-15, 2023-08-20     ==========UPCOMING VISITS==========       ==========APPENDIX - ON TREATMENT VISIT NOTES==========   See weekly On Treatment Notes in Epic for details.

## 2023-08-30 ENCOUNTER — Telehealth: Payer: Self-pay | Admitting: Internal Medicine

## 2023-08-30 NOTE — Telephone Encounter (Signed)
Pt calling in bc she believes she has bronchitis. Offered a earlier appt, pt says she doesn't feel up to it.

## 2023-08-30 NOTE — Telephone Encounter (Signed)
Patient is having wheezing in chest, a little sore throat, head congestion and terrible body aches. Took covid test last week and it was negative. Started feeling bad 6 days ago. Had a low grade fever, higher at night-99 highest. She states like it feels like she has bronchitis again. Patient states that she is unable to come in.

## 2023-08-30 NOTE — Telephone Encounter (Signed)
Send Zpak 250 mg, # 6, 2 today then one daily            Prednisone 10 mg, # 20,               X 2 DAYS, 3 X 2 DAYS, 2 X 2 DAYS, 1 X 2 DAYS  Encourage fluids

## 2023-08-31 MED ORDER — AZITHROMYCIN 250 MG PO TABS: ORAL_TABLET | ORAL | 0 refills | Status: DC

## 2023-08-31 MED ORDER — PREDNISONE 10 MG PO TABS: ORAL_TABLET | ORAL | 0 refills | Status: DC

## 2023-08-31 NOTE — Telephone Encounter (Signed)
After sending in medications spoke with patient who states that she tested positive for covid today. She said the body aches are the worst part. No SOB or difficulty breathing. She has been advised to increase fluids. She has been advised if she developed SOB or difficulty breathing to seek emergent treatment at ER or urgent care. Any additional directions now that she is covid positive?

## 2023-08-31 NOTE — Telephone Encounter (Signed)
Patient states that she does not care for paxlovid. She would like to take the zpak and see if it makes her feel better. She cannot tolerate the paxlovid.

## 2023-08-31 NOTE — Telephone Encounter (Signed)
Ok- cancel paxlovid order. She can take the Zpak as originally ordered.

## 2023-08-31 NOTE — Telephone Encounter (Signed)
Paxlovid not sent in.

## 2023-08-31 NOTE — Telephone Encounter (Signed)
Instead of the Zpak ( she can save it if she has already picked it up)- would offer Paxlovid x 10 doses ( twice daily x 5 days)

## 2023-09-13 ENCOUNTER — Encounter: Payer: Self-pay | Admitting: Radiation Oncology

## 2023-09-16 NOTE — Progress Notes (Signed)
Radiation Oncology         [phone] ________________________________  Name: [name] MRN: [id number]  Date: 09/17/2023  DOB: 10/12/1961  Follow-Up Visit Note  CC: Kaleen Mask, MD  Leslye Peer, MD  No diagnosis found.  Diagnosis: The encounter diagnosis was Malignant neoplasm of right upper lobe of lung (HCC).   Adenocarcinoma of the right upper lobe     Interval Since Last Radiation: about 1 month   Indication for treatment: Curative       Radiation treatment dates: 08/13/23 through 08/20/23  Site/dose: Right lung - 54 Gy delivered in 3 Fx at 18 Gy/Fx Technique/Mode: SBRT/SRT-IMRT / Photon  Energy: 6X-FFF  Narrative:  The patient returns today for routine follow-up. The patient tolerated radiation treatment relatively well. During her final weekly treatment check on 08/21, the patient endorsed fatigue, cough, and shortness of breath. Physical exam performed that same day noted some mild wheezing bilaterally to auscultation; right greater than left. Based on her SOB and audible wheezing, she was advised to use her prescribed inhalers/nebulizers.         Pertinent imaging performed in the interval since her initial consultation date includes an MRI of the brain on 07/11/23 which showed no evidence of intracranial metastatic disease and a mild multifocal T2 FLAIR hyperintense signal abnormality within the cerebral white matter (this is nonspecific but most often secondary to chronic small vessel ischemia).            No other significant oncologic interval history since the patient completed radiation therapy, or in the interval since she was seen for her initial consultation.   ***              Allergies:  is allergic to codeine, topamax [topiramate], and lyrica [pregabalin].  Meds: Current Outpatient Medications  Medication Sig Dispense Refill   albuterol (VENTOLIN HFA) 108 (90 Base) MCG/ACT inhaler INHALE 1 TO 2 PUFFS EVERY 6 HOURS AS NEEDED FOR WHEEZING  OR SHORTNESS OF BREATH. 54 g 3   aspirin EC 81 MG tablet Take 81 mg by mouth daily. Swallow whole.     azithromycin (ZITHROMAX) 250 MG tablet 2 tablets today and 1 pill on days 2-5 6 tablet 0   Biotin 1000 MCG CHEW Chew 1,000 mcg by mouth daily.     Budeson-Glycopyrrol-Formoterol (BREZTRI AEROSPHERE) 160-9-4.8 MCG/ACT AERO Inhale 2 puffs into the lungs 2 (two) times daily. 10.7 g 6   butalbital-acetaminophen-caffeine (FIORICET) 50-325-40 MG tablet Take 1 tablet by mouth 2 (two) times daily as needed for headache or migraine.     cyanocobalamin (VITAMIN B12) 1000 MCG tablet Take 2,000 mcg by mouth daily.     dextromethorphan-guaiFENesin (MUCINEX DM) 30-600 MG 12hr tablet Take 1 tablet by mouth 2 (two) times daily as needed for cough.     diclofenac (VOLTAREN) 75 MG EC tablet Take 75 mg by mouth 2 (two) times daily.     diphenhydrAMINE (BENADRYL) 25 mg capsule Take 25 mg by mouth every 6 (six) hours as needed for allergies.     doxycycline (VIBRA-TABS) 100 MG tablet Take 1 tablet (100 mg total) by mouth 2 (two) times daily. 14 tablet 0   Erenumab-aooe (AIMOVIG, 140 MG DOSE, [place]) Inject 140 mg into the skin every 30 (thirty) days.      famotidine (PEPCID) 20 MG tablet Take 20 mg by mouth 2 (two) times daily as needed for heartburn or indigestion.     lamoTRIgine (LAMICTAL) 100 MG tablet Take 100 mg  by mouth 2 (two) times daily.      mirtazapine (REMERON) 45 MG tablet Take 1 tablet (45 mg total) by mouth at bedtime.     ondansetron (ZOFRAN) 4 MG tablet Take 4 mg by mouth every 8 (eight) hours as needed for nausea or vomiting.     Oxycodone HCl 10 MG TABS Take 10 mg by mouth every 6 (six) hours.     predniSONE (DELTASONE) 10 MG tablet 4 pills x 2 days, 3 pills x 2 days, 2 pills x 2 days, 1 pill x 2 days. 20 tablet 0   Respiratory Therapy Supplies (FLUTTER) DEVI Use as directed. 1 each 0   SUMAtriptan (IMITREX) 100 MG tablet TAKE 1 TABLET (100 MG TOTAL) BY MOUTH AS NEEDED FOR MIGRAINE. (Patient taking  differently: Take 100 mg by mouth every 2 (two) hours as needed for migraine.) 9 tablet 0   tiZANidine (ZANAFLEX) 4 MG tablet Take 1 tablet (4 mg total) by mouth 3 (three) times daily. 90 tablet 0   No current facility-administered medications for this encounter.    Physical Findings: The patient is in no acute distress. Patient is alert and oriented.  vitals were not taken for this visit. .  No significant changes. Lungs are clear to auscultation bilaterally. Heart has regular rate and rhythm. No palpable cervical, supraclavicular, or axillary adenopathy. Abdomen soft, non-tender, normal bowel sounds.   Lab Findings: Lab Results  Component Value Date   WBC 6.2 06/19/2023   HGB 13.1 06/19/2023   HCT 40.9 06/19/2023   MCV 97.4 06/19/2023   PLT 241 06/19/2023    Radiographic Findings: No results found.  Impression:  The encounter diagnosis was Malignant neoplasm of right upper lobe of lung (HCC).   Adenocarcinoma of the right upper lobe     The patient is recovering from the effects of radiation.  ***  Plan:  ***   *** minutes of total time was spent for this patient encounter, including preparation, face-to-face counseling with the patient and coordination of care, physical exam, and documentation of the encounter. ____________________________________  Billie Lade, PhD, MD  This document serves as a record of services personally performed by Antony Blackbird, MD. It was created on his behalf by Neena Rhymes, a trained medical scribe. The creation of this record is based on the scribe's personal observations and the provider's statements to them. This document has been checked and approved by the attending provider.

## 2023-09-16 NOTE — Progress Notes (Signed)
Radiation Oncology         [phone] ________________________________  Name: ZITLALLY OUTLER MRN: [id number]  Date: 09/17/2023  DOB: 1961-07-16  End of Treatment Note  Diagnosis: The encounter diagnosis was Malignant neoplasm of right upper lobe of lung (HCC).   Adenocarcinoma of the right upper lobe     Indication for treatment: Curative        Radiation treatment dates: 08/13/23 through 08/20/23   Site/dose: Right lung - 54 Gy delivered in 3 Fx at 18 Gy/Fx   Technique/Mode: SBRT/SRT-IMRT / Photon   Energy: 6X-FFF  Narrative: The patient tolerated radiation treatment relatively well. During her final weekly treatment check on 08/21, the patient endorsed fatigue, cough, and shortness of breath. Physical exam performed that same day noted some mild wheezing bilaterally to auscultation; right greater than left. Based on her SOB and audible wheezing, she was advised to use her prescribed inhalers/nebulizers.   Plan: The patient has completed radiation treatment. The patient will return to radiation oncology clinic for routine followup in one month. I advised them to call or return sooner if they have any questions or concerns related to their recovery or treatment.  -----------------------------------  Billie Lade, PhD, MD  This document serves as a record of services personally performed by Antony Blackbird, MD. It was created on his behalf by Neena Rhymes, a trained medical scribe. The creation of this record is based on the scribe's personal observations and the provider's statements to them. This document has been checked and approved by the attending provider.

## 2023-09-17 ENCOUNTER — Encounter: Payer: Self-pay | Admitting: Radiation Oncology

## 2023-09-17 ENCOUNTER — Ambulatory Visit
Admission: RE | Admit: 2023-09-17 | Discharge: 2023-09-17 | Disposition: A | Discharge status: Home / Self Care | Payer: Medicare HMO | Source: Ambulatory Visit | Attending: Radiation Oncology | Admitting: Radiation Oncology

## 2023-09-17 VITALS — BP 130/88 | HR 110 | Temp 97.1°F | Resp 20 | Ht 63.0 in | Wt 130.4 lb

## 2023-09-17 DIAGNOSIS — Z923 Personal history of irradiation: Secondary | ICD-10-CM | POA: Diagnosis not present

## 2023-09-17 DIAGNOSIS — C3411 Malignant neoplasm of upper lobe, right bronchus or lung: Secondary | ICD-10-CM | POA: Insufficient documentation

## 2023-09-17 HISTORY — DX: Personal history of irradiation: Z92.3

## 2023-09-17 NOTE — Progress Notes (Signed)
[name] is here today for follow up post radiation to the lung.  Lung Side: Right, patient completed treatment on 08/20/23.  Does the patient complain of any of the following: Pain: No Shortness of breath w/wo exertion:  Yes, mostly on exertion.  Cough: Yes, productive  Hemoptysis: No Pain with swallowing: No Swallowing/choking concerns: No Appetite: Good  Energy Level: Low, patient recently had covid.  Post radiation skin Changes: No    Additional comments if applicable:   BP 130/88 (BP Location: Left Arm, Patient Position: Sitting, Cuff Size: Normal)   Pulse (!) 110   Temp (!) 97.1 F (36.2 C)   Resp 20   Ht 5\' 3"  (1.6 m)   Wt 130 lb 6.4 oz (59.1 kg)   SpO2 97%   BMI 23.10 kg/m

## 2023-10-21 NOTE — Progress Notes (Signed)
10/23/23- 62 yoF former smoker(46 pkyrs) new to me. Seen previously by Dr Vassie Loll for COPD. Lung nodule on CT with bronchoscopy by Dr Delton Coombes 06/19/23 dxd Adenocarcinoma. XRT/ Dr Roselind Messier Positive Covid/ bronchitis 9/5. Pt declined paxlovid, took Zpak, prednisone. -ventolin hfa, Breztri,  lamictal Using Ventolin about twice daily.  Flu vax today.  Has finished her XRT for now. Quit smoking. Since then has had much less cough. Still DOE. Pending cardiology eval for ASCVD. Walk Test 10/23/23- 3 laps. Lowest O2 sat 94%. Max HR 102/min. CT chest 04/30/23-  IMPRESSION: 1. Irregular solid nodule of the right upper lobe measuring 12 mm demonstrates marked increased density, previously part solid, highly concerning for primary lung adenocarcinoma. Lung-RADS 4B, suspicious. Additional imaging evaluation or consultation with Pulmonology or Thoracic Surgery recommended. 2. Aortic Atherosclerosis (ICD10-I70.0) and Emphysema (ICD10-J43.9).

## 2023-10-23 ENCOUNTER — Encounter: Payer: Self-pay | Admitting: Internal Medicine

## 2023-10-23 ENCOUNTER — Ambulatory Visit: Payer: Medicare HMO | Admitting: Internal Medicine

## 2023-10-23 VITALS — BP 138/74 | HR 89 | Ht 61.0 in | Wt 137.2 lb

## 2023-10-23 DIAGNOSIS — J4489 Other specified chronic obstructive pulmonary disease: Secondary | ICD-10-CM | POA: Diagnosis not present

## 2023-10-23 DIAGNOSIS — C3411 Malignant neoplasm of upper lobe, right bronchus or lung: Secondary | ICD-10-CM | POA: Diagnosis not present

## 2023-10-23 DIAGNOSIS — J439 Emphysema, unspecified: Secondary | ICD-10-CM

## 2023-10-23 DIAGNOSIS — J441 Chronic obstructive pulmonary disease with (acute) exacerbation: Secondary | ICD-10-CM | POA: Diagnosis not present

## 2023-10-23 DIAGNOSIS — Z23 Encounter for immunization: Secondary | ICD-10-CM | POA: Diagnosis not present

## 2023-10-23 NOTE — Patient Instructions (Addendum)
Order- Walk test- O2 qualifying       dx dyspnea on exertion  Order- Flu vax standard  Order- schedule overnight oximetry on room air     dx dyspnea on exertion

## 2023-10-24 ENCOUNTER — Other Ambulatory Visit: Payer: Self-pay | Admitting: Physician Assistant

## 2023-10-24 DIAGNOSIS — M5416 Radiculopathy, lumbar region: Secondary | ICD-10-CM

## 2023-10-26 ENCOUNTER — Encounter: Payer: Self-pay | Admitting: Internal Medicine

## 2023-10-26 NOTE — Assessment & Plan Note (Signed)
Doesn't qualify for portable O2.  Plan- overnight oximetry. Refill inhalers as needed. Flu vax.

## 2023-10-26 NOTE — Assessment & Plan Note (Signed)
Has finished XRT for now. Followed by Oncology

## 2023-11-13 NOTE — Progress Notes (Unsigned)
Cardiology Office Note:  .   Date:  11/14/2023 ID:  Lindsay, Green 11-04-1961, MRN [id number] PCP: Kaleen Mask, MD [facility] Providers Cardiologist:  None   Patient Profile: .      PMH Coronary calcification seen on CT Lung CA screening CT 05/03/23 Mild calcified plaque Former tobacco abuse Pulmonary nodule concerning for carcinoma Bronchoscopy 06/19/23 revealed adenocarcinoma XRT Aortic atherosclerosis COPD  Hyperlipidemia       History of Present Illness: .   [name] is a very pleasant 62 y.o. female  who is here today for new patient consult for coronary artery calcification noted on a recent CT scan. She reports chronic fatigue and exertional dyspnea. Will feel her heart rate increase at times which makes her short of breath.  She wonders if this is a panic attack.  Long-time cigarette smoker, she has received 3 radiation treatments for lung cancer.  She quit smoking a few months ago. She occasionally takes a few puffs but does not inhale. She feels pressure in her chest at times. Notes occasional ankle swelling. Has slept in a slightly elevated position for a while. Is not very active, only with housework. Does not know how long cholesterol has been elevated but started on atorvastatin in October. Family history of heart disease with father who had a cardiac stent. Started taking aspirin for risk reduction after seeing a commercial on TV.  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 Does use olive or canola oil. Admits to frequently eating bacon and sausage as her husband and her particularly enjoyed breakfast food.   Family history: Her family history includes Cancer in her father and mother.  Father had stent   ASCVD Risk Score: 9.9 % Risk of cardiovascular event (coronary or stroke death or non-fatal MI or stroke) in next 10 years.  Moderate- to high-intensity statin recommended because 10-year risk >7.5%    ACC/AHA  recommends initiating statins at this risk level, but the USPSTF recommends a trial of lifestyle intervention first - use clinician judgment and shared decision making in deciding whether or not to start a statin  To view statin dosages by intensity, see Evidence section.  INPUTS: History of ASCVD --> 0 = No LDL Cholesterol >=190mg /dL (9.14 mmol/L) --> 0 = No Age --> 62 years Diabetes --> 0 = No Sex --> 0 = Female Total Cholesterol --> 226 mg/dL HDL Cholesterol --> 64 mg/dL Systolic Blood Pressure --> 132 mm Hg Treatment for Hypertension --> 0 = No Smoker --> 1 = Yes Race --> 1 = White   Diet: Mostly country cooking Love breakfast food - lots of sausage and bacon  Activity: Just housework  ROS: See HPI       Studies Reviewed: Marland Kitchen   EKG Interpretation Date/Time:  Wednesday November 14 2023 15:02:36 EST Ventricular Rate:  79 PR Interval:  126 QRS Duration:  84 QT Interval:  346 QTC Calculation: 396 R Axis:   28  Text Interpretation: Normal sinus rhythm Nonspecific ST abnormality Confirmed by Eligha Bridegroom [phone]) on 11/14/2023 3:08:35 PM       Risk Assessment/Calculations:             Physical Exam:   VS: BP 132/70   Pulse 76   Ht 5\' 1"  (1.549 m)   Wt 137 lb 6.4 oz (62.3 kg)   SpO2 97%   BMI 25.96 kg/m   Wt Readings from Last 3 Encounters:  11/14/23 137 lb 6.4 oz (  62.3 kg)  10/23/23 137 lb 3.2 oz (62.2 kg)  09/17/23 130 lb 6.4 oz (59.1 kg)     GEN: Well nourished, well developed in no acute distress NECK: No JVD; No carotid bruits CARDIAC: RRR, no murmurs, rubs, gallops RESPIRATORY:  Clear to auscultation without rales, wheezing or rhonchi  ABDOMEN: Soft, non-tender, non-distended EXTREMITIES:  No edema; No deformity     ASSESSMENT AND PLAN: .    Coronary artery calcification/Aortic atherosclerosis: Mild calcifications noted on recent lung CT.  She has occasional chest pressure and shortness of breath.  We will get coronary CTA for evaluation of  ischemia. Will have her take metoprolol 100 mg 2 hours prior to test. Continue aspirin 81 mg daily, atorvastatin.  Dyslipidemia LDL goal < 70: Lipid panel 10/09/2023 with total cholesterol 226, triglycerides 98, HDL 64, and LDL 145.  She was told LDL was mildly elevated and to start atorvastatin 10 mg daily.  I have encouraged her to increase atorvastatin to 20 mg daily for now as we await CT results. Goal LDL < 70. Encouraged Mediterranean style diet and limiting intake of saturated fat and bacon, sausage, full fat dairy, butter. Will plan to check NMR lipid panel at next office visit.   Tobacco abuse: 46-pack-year history with history of COPD and lung adenocarcinoma.  She reports she has quit smoking. I congratulated her on this achievement.   CV Risk Assessment: ASCVD risk score 9.9%.  She was recently started on low-dose atorvastatin for hyperlipidemia.  Explained 10 mg likely would not be enough to effectively lower LDL.  We will increase to 20 mg for now and await CT results for further risk stratification.  She recently quit smoking, congratulated her on this achievement. She reports BP has always been well-controlled.    Plan/Goals: Decrease intake of saturated fat Increase physical activity to aim for walking 150 minutes each week     Dispo: 2-3 months with me  Signed, Eligha Bridegroom, NP-C

## 2023-11-14 ENCOUNTER — Ambulatory Visit (HOSPITAL_BASED_OUTPATIENT_CLINIC_OR_DEPARTMENT_OTHER): Payer: Medicare HMO | Admitting: Nurse Practitioner

## 2023-11-14 ENCOUNTER — Encounter (HOSPITAL_BASED_OUTPATIENT_CLINIC_OR_DEPARTMENT_OTHER): Payer: Self-pay

## 2023-11-14 ENCOUNTER — Encounter (HOSPITAL_BASED_OUTPATIENT_CLINIC_OR_DEPARTMENT_OTHER): Payer: Self-pay | Admitting: Nurse Practitioner

## 2023-11-14 VITALS — BP 132/70 | HR 76 | Ht 61.0 in | Wt 137.4 lb

## 2023-11-14 DIAGNOSIS — Z7189 Other specified counseling: Secondary | ICD-10-CM | POA: Diagnosis not present

## 2023-11-14 DIAGNOSIS — Z720 Tobacco use: Secondary | ICD-10-CM

## 2023-11-14 DIAGNOSIS — I2510 Atherosclerotic heart disease of native coronary artery without angina pectoris: Secondary | ICD-10-CM | POA: Diagnosis not present

## 2023-11-14 DIAGNOSIS — E785 Hyperlipidemia, unspecified: Secondary | ICD-10-CM | POA: Diagnosis not present

## 2023-11-14 MED ORDER — METOPROLOL TARTRATE 100 MG PO TABS: ORAL_TABLET | ORAL | 0 refills | Status: DC

## 2023-11-14 MED ORDER — ATORVASTATIN CALCIUM 20 MG PO TABS: 20.0000 mg | ORAL_TABLET | Freq: Every day | ORAL | 3 refills | Status: DC

## 2023-11-14 NOTE — Patient Instructions (Signed)
Medication Instructions:   INCREASE Atorvastatin one (1) tablet by mouth ( 20 mg) daily. You can take two (2) of your ( 10 mg) tablets at the same time and use them up.   *If you need a refill on your cardiac medications before your next appointment, please call your pharmacy*   Lab Work:  Your physician recommends that you return for lab work at least one week prior to your CTA test. Please bring paperwork provided today.    If you have labs (blood work) drawn today and your tests are completely normal, you will receive your results only by: MyChart Message (if you have MyChart) OR A paper copy in the mail If you have any lab test that is abnormal or we need to change your treatment, we will call you to review the results.   Testing/Procedures:    Your cardiac CT will be scheduled at one of the below locations:   [facility] [address] [place], [place] [address] [phone]  If scheduled at [facility], please arrive at the [facility] and Children's Entrance (Entrance C2) of [facility] 30 minutes prior to test start time. You can use the FREE valet parking offered at entrance C (encouraged to control the heart rate for the test)  Proceed to the [facility] Radiology Department (first floor) to check-in and test prep.  All radiology patients and guests should use entrance C2 at [facility], accessed from [facility], even though the hospital's physical address listed is [address]    Please follow these instructions carefully (unless otherwise directed):  An IV will be required for this test and Nitroglycerin will be given.   On the Night Before the Test: Be sure to Drink plenty of water. Do not consume any caffeinated/decaffeinated beverages or chocolate 12 hours prior to your test. Do not take any antihistamines 12 hours prior to your test.   On the Day of the Test: Drink plenty of water until 1 hour  prior to the test. Do not eat any food 1 hour prior to test. You may take your regular medications prior to the test.  Take metoprolol (Lopressor) (100 mg) by mouth two hours prior to test. FEMALES- please wear underwire-free bra if available, avoid dresses & tight clothing        After the Test: Drink plenty of water. After receiving IV contrast, you may experience a mild flushed feeling. This is normal. On occasion, you may experience a mild rash up to 24 hours after the test. This is not dangerous. If this occurs, you can take Benadryl 25 mg and increase your fluid intake. If you experience trouble breathing, this can be serious. If it is severe call 911 IMMEDIATELY. If it is mild, please call our office.   We will call to schedule your test 2-4 weeks out understanding that some insurance companies will need an authorization prior to the service being performed.   For more information and frequently asked questions, please visit our website : [email or web address]  For non-scheduling related questions, please contact the cardiac imaging nurse navigator should you have any questions/concerns: Cardiac Imaging Nurse Navigators Direct Office Dial: [phone]   For scheduling needs, including cancellations and rescheduling, please call [place], [phone].    Follow-Up:Please be fasting after midnight.  At [facility], you and your health needs are our priority.  As part of our continuing mission to provide you with exceptional heart care, we have  created designated Provider Care Teams.  These Care Teams include your primary Cardiologist (physician) and Advanced Practice Providers (APPs -  Physician Assistants and Nurse Practitioners) who all work together to provide you with the care you need, when you need it.  We recommend signing up for the patient portal called "MyChart".  Sign up information is provided on this After Visit Summary.  MyChart is used to  connect with patients for Virtual Visits (Telemedicine).  Patients are able to view lab/test results, encounter notes, upcoming appointments, etc.  Non-urgent messages can be sent to your provider as well.   To learn more about what you can do with MyChart, go to [email or web address].    Your next appointment:   3 month(s)  Provider:   Eligha Bridegroom, NP

## 2023-11-19 ENCOUNTER — Telehealth: Payer: Self-pay | Admitting: Internal Medicine

## 2023-11-19 NOTE — Telephone Encounter (Signed)
Documentation for order. Please notify patient that overnight oximetry test from [employer] on 11/13/23 shows very low oxygen levels during the night. At or below 88% on room air for over 2 hours.  Order- DME [employer]- new order Home O2 - 2L during sleep   dx Nocturnal Hypoxemia

## 2023-11-25 ENCOUNTER — Other Ambulatory Visit: Payer: Medicare HMO

## 2023-12-06 LAB — BASIC METABOLIC PANEL
BUN/Creatinine Ratio: 16 (ref 12–28)
BUN: 14 mg/dL (ref 8–27)
CO2: 24 mmol/L (ref 20–29)
Calcium: 9.5 mg/dL (ref 8.7–10.3)
Chloride: 105 mmol/L (ref 96–106)
Creatinine, Ser: 0.85 mg/dL (ref 0.57–1.00)
Glucose: 81 mg/dL (ref 70–99)
Potassium: 4.1 mmol/L (ref 3.5–5.2)
Sodium: 145 mmol/L — ABNORMAL HIGH (ref 134–144)
eGFR: 76 mL/min/{1.73_m2} (ref >59)

## 2023-12-07 ENCOUNTER — Telehealth (HOSPITAL_BASED_OUTPATIENT_CLINIC_OR_DEPARTMENT_OTHER): Payer: Self-pay | Admitting: Nurse Practitioner

## 2023-12-07 NOTE — Progress Notes (Signed)
Pt has been made aware of normal result and verbalized understanding.  jw

## 2023-12-07 NOTE — Telephone Encounter (Signed)
Patient returned staff call regarding results.

## 2023-12-07 NOTE — Telephone Encounter (Signed)
Patient already spoke with Victorino Dike about lab results. See lab result notes.  No further needs voiced at this time.

## 2023-12-10 ENCOUNTER — Encounter (HOSPITAL_COMMUNITY): Payer: Self-pay

## 2023-12-10 ENCOUNTER — Telehealth (HOSPITAL_COMMUNITY): Payer: Self-pay | Admitting: *Deleted

## 2023-12-10 ENCOUNTER — Telehealth: Payer: Self-pay | Admitting: *Deleted

## 2023-12-10 NOTE — Telephone Encounter (Signed)
Called patient to inform of CT for 12-17-23- arrival time- 2:45 pm @ [facility] Radiology, no restrictions to scan, patient to receive results from Dr. Roselind Messier on 12-24-23 @ 3:45 pm, lvm for a return call

## 2023-12-10 NOTE — Telephone Encounter (Signed)
Reaching out to patient to offer assistance regarding upcoming cardiac imaging study; pt verbalizes understanding of appt date/time, parking situation and where to check in, pre-test NPO status and medications ordered, and verified current allergies; name and call back number provided for further questions should they arise  Gordy Clement RN Navigator Cardiac Imaging [facility] [phone] office [phone] cell  Patient to take '100mg'$  metoprolol tartrate two hours prior to her cardiac CT scan. She is aware to arrive at [facility].

## 2023-12-11 ENCOUNTER — Telehealth: Payer: Self-pay | Admitting: *Deleted

## 2023-12-11 ENCOUNTER — Ambulatory Visit (HOSPITAL_COMMUNITY)
Admission: RE | Admit: 2023-12-11 | Discharge: 2023-12-11 | Disposition: A | Discharge status: Home / Self Care | Payer: Medicare HMO | Source: Ambulatory Visit | Attending: Nurse Practitioner | Admitting: Nurse Practitioner

## 2023-12-11 DIAGNOSIS — I2510 Atherosclerotic heart disease of native coronary artery without angina pectoris: Secondary | ICD-10-CM | POA: Diagnosis not present

## 2023-12-11 MED ORDER — NITROGLYCERIN 0.4 MG SL SUBL
0.8000 mg | SUBLINGUAL_TABLET | Freq: Once | SUBLINGUAL | Status: AC
  Administered 2023-12-11: 0.8 mg via SUBLINGUAL

## 2023-12-11 MED ORDER — NITROGLYCERIN 0.4 MG SL SUBL
SUBLINGUAL_TABLET | SUBLINGUAL | Status: AC
  Filled 2023-12-11: qty 2

## 2023-12-11 MED ORDER — IOHEXOL 350 MG/ML SOLN
190.0000 mL | Freq: Once | INTRAVENOUS | Status: AC | PRN
  Administered 2023-12-11: 190 mL via INTRAVENOUS

## 2023-12-11 NOTE — Telephone Encounter (Signed)
RETURNED PATIENT'S PHONE CALL, LVM FOR A RETURN CALL 

## 2023-12-13 ENCOUNTER — Other Ambulatory Visit: Payer: Self-pay | Admitting: *Deleted

## 2023-12-13 DIAGNOSIS — E785 Hyperlipidemia, unspecified: Secondary | ICD-10-CM

## 2023-12-13 DIAGNOSIS — I2510 Atherosclerotic heart disease of native coronary artery without angina pectoris: Secondary | ICD-10-CM

## 2023-12-14 ENCOUNTER — Ambulatory Visit (HOSPITAL_COMMUNITY)
Admission: RE | Admit: 2023-12-14 | Discharge: 2023-12-14 | Disposition: A | Discharge status: Home / Self Care | Payer: Medicare HMO | Source: Ambulatory Visit | Attending: Radiation Oncology | Admitting: Radiation Oncology

## 2023-12-14 DIAGNOSIS — C3411 Malignant neoplasm of upper lobe, right bronchus or lung: Secondary | ICD-10-CM | POA: Insufficient documentation

## 2023-12-17 ENCOUNTER — Ambulatory Visit (HOSPITAL_COMMUNITY): Payer: Medicare HMO

## 2023-12-24 ENCOUNTER — Encounter: Payer: Self-pay | Admitting: Radiation Oncology

## 2023-12-24 ENCOUNTER — Ambulatory Visit
Admission: RE | Admit: 2023-12-24 | Discharge: 2023-12-24 | Disposition: A | Discharge status: Home / Self Care | Payer: Medicare HMO | Source: Ambulatory Visit | Attending: Radiation Oncology | Admitting: Radiation Oncology

## 2023-12-24 VITALS — BP 144/74 | Temp 97.8°F | Resp 18 | Ht 61.0 in | Wt 139.6 lb

## 2023-12-24 DIAGNOSIS — C3411 Malignant neoplasm of upper lobe, right bronchus or lung: Secondary | ICD-10-CM

## 2023-12-24 HISTORY — DX: Atherosclerotic heart disease of native coronary artery without angina pectoris: I25.10

## 2023-12-24 NOTE — Progress Notes (Signed)
[name] is here today for follow up post radiation to the lung.  Lung Side: Right  Does the patient complain of any of the following: Pain:Denies Shortness of breath w/wo exertion: Denies Cough: Denies Hemoptysis: Denies Pain with swallowing: Denies Swallowing/choking concerns: Denies Appetite: Good Energy Level: Fair Post radiation skin Changes: Denies    BP (!) 144/74 (BP Location: Right Arm, Patient Position: Sitting, Cuff Size: Normal)   Temp 97.8 F (36.6 C)   Resp 18   Ht 5\' 1"  (1.549 m)   Wt 139 lb 9.6 oz (63.3 kg)   SpO2 98%   BMI 26.38 kg/m

## 2023-12-24 NOTE — Progress Notes (Signed)
Radiation Oncology         [phone] ________________________________  Name: [name] MRN: [id number]  Date: 12/24/2023  DOB: 12/31/1960  Follow-Up Visit Note  CC: Kaleen Mask, MD  Leslye Peer, MD    ICD-10-CM   1. Malignant neoplasm of right upper lobe of lung [facility])  C34.11 CT Chest Wo Contrast      Diagnosis: The encounter diagnosis was Malignant neoplasm of right upper lobe of lung (HCC).   Adenocarcinoma of the right upper lobe     Interval Since Last Radiation: 4 months and 4 days   Indication for treatment: Curative       Radiation treatment dates: 08/13/23 through 08/20/23  Site/dose: Right lung - 54 Gy delivered in 3 Fx at 18 Gy/Fx Technique/Mode: SBRT/SRT-IMRT / Photon  Energy: 6X-FFF  Narrative:  The patient returns today for routine follow-up and to review recent imaging. He was last seen here for follow-up on 09/17/23.   He has continued to follow with Dr. Maple Hudson at [facility]. During his most recent visit with Dr. Maple Hudson on 10/23/23, his O2 sats were noted to be reasonable and his COPD inhaler regimen will remain the same.    His most recent chest CT on 12/14/23 showed a decrease in size of the hypermetabolic RML nodule. A new 5 mm nodule in the RUL and a new 4 mm nodule posterior and inferior to this nodule were also demonstrated which warrant attention to follow-up. CT otherwise showed stability of the 4 mm LLL nodule, and no new evidence of thoracic lymphadenopathy.   No other significant oncologic interval history since the patient was last seen for follow-up.   Patient reports to be doing well overall today.  She denies any shortness of breath, cough, hemoptysis, chest pain or any other changes in her breathing.  Allergies:  is allergic to codeine, topamax [topiramate], and lyrica [pregabalin].  Meds: Current Outpatient Medications  Medication Sig Dispense Refill   albuterol (VENTOLIN HFA) 108 (90 Base) MCG/ACT inhaler INHALE 1  TO 2 PUFFS EVERY 6 HOURS AS NEEDED FOR WHEEZING OR SHORTNESS OF BREATH. 54 g 3   aspirin EC 81 MG tablet Take 81 mg by mouth daily. Swallow whole.     atorvastatin (LIPITOR) 20 MG tablet Take 1 tablet (20 mg total) by mouth daily. 90 tablet 3   Biotin 1000 MCG CHEW Chew 1,000 mcg by mouth daily.     Budeson-Glycopyrrol-Formoterol (BREZTRI AEROSPHERE) 160-9-4.8 MCG/ACT AERO Inhale 2 puffs into the lungs 2 (two) times daily. 10.7 g 6   butalbital-acetaminophen-caffeine (FIORICET) 50-325-40 MG tablet Take 1 tablet by mouth 2 (two) times daily as needed for headache or migraine.     cyanocobalamin (VITAMIN B12) 1000 MCG tablet Take 2,000 mcg by mouth daily.     dextromethorphan-guaiFENesin (MUCINEX DM) 30-600 MG 12hr tablet Take 1 tablet by mouth 2 (two) times daily as needed for cough.     diclofenac (VOLTAREN) 75 MG EC tablet Take 75 mg by mouth 2 (two) times daily.     diphenhydrAMINE (BENADRYL) 25 mg capsule Take 25 mg by mouth every 6 (six) hours as needed for allergies.     Erenumab-aooe (AIMOVIG, 140 MG DOSE, [place]) Inject 140 mg into the skin every 30 (thirty) days.     famotidine (PEPCID) 20 MG tablet Take 20 mg by mouth 2 (two) times daily as needed for heartburn or indigestion.     lamoTRIgine (LAMICTAL) 100 MG tablet Take 100 mg by mouth 2 (  two) times daily.      mirtazapine (REMERON) 45 MG tablet Take 1 tablet (45 mg total) by mouth at bedtime.     ondansetron (ZOFRAN) 4 MG tablet Take 4 mg by mouth every 8 (eight) hours as needed for nausea or vomiting.     Oxycodone HCl 10 MG TABS Take 10 mg by mouth every 6 (six) hours.     Respiratory Therapy Supplies (FLUTTER) DEVI Use as directed. 1 each 0   SUMAtriptan (IMITREX) 100 MG tablet TAKE 1 TABLET (100 MG TOTAL) BY MOUTH AS NEEDED FOR MIGRAINE. (Patient taking differently: Take 100 mg by mouth every 2 (two) hours as needed for migraine.) 9 tablet 0   tiZANidine (ZANAFLEX) 4 MG tablet Take 4 mg by mouth 3 (three) times daily.     metoprolol  tartrate (LOPRESSOR) 100 MG tablet Take one (1) tablet by mouth (100 mg ) 2 hours prior to CT scan. (Patient not taking: Reported on 12/24/2023) 1 tablet 0   No current facility-administered medications for this encounter.    Physical Findings: The patient is in no acute distress. Patient is alert and oriented.  height is 5\' 1"  (1.549 m) and weight is 139 lb 9.6 oz (63.3 kg). Her temperature is 97.8 F (36.6 C). Her blood pressure is 144/74 (abnormal). Her respiration is 18 and oxygen saturation is 98%. .  No significant changes. Lungs are clear to auscultation bilaterally. Heart has regular rate and rhythm. No palpable cervical, supraclavicular, or axillary adenopathy. Abdomen soft, non-tender, normal bowel sounds.   Lab Findings: Lab Results  Component Value Date   WBC 6.2 06/19/2023   HGB 13.1 06/19/2023   HCT 40.9 06/19/2023   MCV 97.4 06/19/2023   PLT 241 06/19/2023    Radiographic Findings: CT CHEST WO CONTRAST Result Date: 12/21/2023 CLINICAL DATA:  Non-small-cell lung cancer. Assess treatment response. Right upper lobe. * Tracking Code: BO * EXAM: CT CHEST WITHOUT CONTRAST TECHNIQUE: Multidetector CT imaging of the chest was performed following the standard protocol without IV contrast. RADIATION DOSE REDUCTION: This exam was performed according to the departmental dose-optimization program which includes automated exposure control, adjustment of the mA and/or kV according to patient size and/or use of iterative reconstruction technique. COMPARISON:  X-ray 06/19/2023.  CT 06/08/2023.  Older exams as well FINDINGS: Cardiovascular: Heart is nonenlarged. No pericardial effusion. Coronary artery calcifications are seen. The thoracic aorta has a normal course and caliber with some scattered calcified atherosclerotic plaque. Please correlate with the recent calcium scoring exam. Mediastinum/Nodes: No specific abnormal lymph node enlargement identified on this noncontrast examination in the  axillary regions, hilum or mediastinum. Preserved thyroid gland. Slightly patulous thoracic esophagus. Lungs/Pleura: Emphysematous lung changes identified. No pleural effusion or pneumothorax. There is a fiduciary marker seen in the right upper lobe abutting the previously seen hypermetabolic lung nodule. On the prior this measured 12 x 10 mm and today series 5, image 57 by 6 mm. There is increasing scarring atelectatic changes posterior to the nodule. There is new small spiculated nodule seen superior and medial to the aforementioned nodule on series 5, image 52 measuring 5 mm. 4 mm new nodule posteroinferior to the main lesion as well on series 5, image 59. Previously there is a 4 mm left lower lobe nodule inferiorly and anteriorly which is stable today on series 5, image 103. No other new nodules identified. Upper Abdomen: Adrenal glands are preserved in the upper abdomen. Musculoskeletal: Osteopenia. Scattered moderate degenerative changes along the thoracic spine. Areas of  disc bulging. Trace retrolisthesis as well T11 on T12. Fixation hardware along the lower cervical spine at the edge of the imaging field. IMPRESSION: If fiduciary marker seen adjacent to the previous right upper lobe nodule which was hypermetabolic. The nodule appears smaller today. Increasing adjacent scarring and atelectatic changes. New 5 mm nodule in the right upper lobe superomedial to the aforementioned dominant focus. New nodule posterior inferiorly as well measuring 4 mm. Attention on short follow up in 3 months. Stable 4 mm left lower lobe lung nodule. No developing lymph node enlargement in the thorax or effusion. Coronary artery calcifications are seen. Please correlate with recent cardiac exam Aortic Atherosclerosis (ICD10-I70.0) and Emphysema (ICD10-J43.9). Electronically Signed   By: Karen Kays M.D.   On: 12/21/2023 12:56   CT CORONARY MORPH W/CTA COR W/SCORE W/CA W/CM &/OR WO/CM Result Date: 12/12/2023 CLINICAL DATA:   This is a 62 year old female with anginal symptoms EXAM: Cardiac/Coronary CTA TECHNIQUE: The patient was scanned on a [employer]. FINDINGS: A 100 kV prospective scan was triggered in the descending thoracic aorta at 111 HU's. Axial non-contrast 3 mm slices were carried out through the heart. The data set was analyzed on a dedicated work station and scored using the Agatson method. Gantry rotation speed was 250 msecs and collimation was .6 mm. No beta blockade and 0.8 mg of sl NTG was given. The 3D data set was reconstructed in 5% intervals of the 67-82 % of the R-R cycle. Diastolic phases were analyzed on a dedicated work station using MPR, MIP and VRT modes. The patient received 80 cc of contrast. Aorta: Normal size.  No calcifications.  No dissection. Aortic Valve:  Trileaflet.  No calcifications. Coronary Arteries:  Normal coronary origin.  Right dominance. RCA is a large dominant artery that gives rise to PDA and PLA. There is no plaque. Left main is a large artery that gives rise to LAD and LCX arteries. LAD is a large vessel that has no plaque. LCX is a non-dominant artery that gives rise to one large OM1 branch. There is a focal (<24%) calcified plaques in the proximal-mid OM. The distal OM with no plaques. Coronary Calcium Score: Left main: 0 Left anterior descending artery: 0 Left circumflex artery: 6.71 Right coronary artery: 0 Total: 6.71 Percentile: 51 Other findings: Normal pulmonary vein drainage into the left atrium. Normal left atrial appendage without a thrombus. Normal size of the pulmonary artery. Minimal mitral annular calcification. IMPRESSION: 1. Coronary calcium score of 6.71. This was 48 percentile for age and sex matched control. 2. Normal coronary origin with right dominance. 3. CAD-RADS 1. Minimal non-obstructive CAD (0-24%). Consider non-atherosclerotic causes of chest pain. Consider preventive therapy and risk factor modification. 4. Total plaque volume 71 mm3 which is 47th  percentile for age- and sex-matched controls (calcified plaque 3 mm3; non-calcified plaque 68 mm3 Low attenuation 0 mm3).  TPV is mild The noncardiac portion of this study will be interpreted in separate report by the radiologist. Electronically Signed   By: Thomasene Ripple D.O.   On: 12/12/2023 10:52    Impression: The encounter diagnosis was Malignant neoplasm of right upper lobe of lung (HCC).   Adenocarcinoma of the right upper lobe     We reviewed her most recent imaging.  CT scan shows stable changes to the treated right upper lobe tumor.  Two new adjacent pulmonary nodules were also visualized lung with a stable pulmonary nodule in the left lower lobe.  We will continue to  monitor these closely with follow-up imaging.  Plan:  Dr. Roselind Messier recommends follow up chest CT in 3 months. Routine office visit 1-2 days after scan to review results. Patient is in agreement with this plan and knows to call with any questions or concerns in the meantime.   20 minutes of total time was spent for this patient encounter, including preparation, face-to-face counseling with the patient and coordination of care, physical exam, and documentation of the encounter. ____________________________________   Bryan Lemma, PA-C   This document serves as a record of services personally performed by Bryan Lemma, PA-C. It was created on his behalf by Neena Rhymes, a trained medical scribe. The creation of this record is based on the scribe's personal observations and the provider's statements to them. This document has been checked and approved by the attending provider.

## 2023-12-27 ENCOUNTER — Encounter: Payer: Self-pay | Admitting: Internal Medicine

## 2024-01-24 ENCOUNTER — Other Ambulatory Visit: Payer: Self-pay | Admitting: Family Medicine

## 2024-01-24 DIAGNOSIS — Z1231 Encounter for screening mammogram for malignant neoplasm of breast: Secondary | ICD-10-CM

## 2024-01-25 ENCOUNTER — Ambulatory Visit: Payer: Medicare HMO

## 2024-02-04 ENCOUNTER — Ambulatory Visit
Admission: EM | Admit: 2024-02-04 | Discharge: 2024-02-04 | Disposition: A | Discharge status: Home / Self Care | Payer: Medicare Other

## 2024-02-04 DIAGNOSIS — L309 Dermatitis, unspecified: Secondary | ICD-10-CM | POA: Diagnosis not present

## 2024-02-04 DIAGNOSIS — L03116 Cellulitis of left lower limb: Secondary | ICD-10-CM

## 2024-02-04 MED ORDER — PREDNISONE 20 MG PO TABS: 40.0000 mg | ORAL_TABLET | Freq: Every day | ORAL | 0 refills | Status: AC

## 2024-02-04 MED ORDER — DOXYCYCLINE HYCLATE 100 MG PO CAPS: 100.0000 mg | ORAL_CAPSULE | Freq: Two times a day (BID) | ORAL | 0 refills | Status: DC

## 2024-02-04 NOTE — ED Provider Notes (Addendum)
 [facility]    CSN: [id number] Arrival date & time: 02/04/24  1850      History   Chief Complaint Chief Complaint  Patient presents with   Animal Bite    HPI [name] is a 63 y.o. female.   Patient here today for evaluation of a rash to her bilateral arms and left leg that started several months ago.  She reports the rash is very itchy.  She has had some swelling that is developed in her left lower leg over the last few days.  She has tried multiple topical treatments including using alcohol to clean the area as well as medication for poison ivy.  She notes she was scratched and bit to her left lower leg by a cat in October but is unsure if this is related.  She thought it could be her laundry detergent as well.  She has not had fever.  She denies any nausea or vomiting.  The history is provided by the patient.  Animal Bite Associated symptoms: no fever and no numbness     Past Medical History:  Diagnosis Date   Anxiety    Arthritis    Cancer of cervix (HCC)    Cervical spondylosis without myelopathy 08/04/2013   COPD (chronic obstructive pulmonary disease) (HCC)    Coronary artery calcification    DDD (degenerative disc disease), lumbar    Depression    Dyspnea    on exertion   Emphysema of lung (HCC)    Fibromyalgia    GERD (gastroesophageal reflux disease)    History of radiation therapy    Right lung- 08/13/23-08/20/23- Dr. Retta Caster   Migraine without aura, with intractable migraine, so stated, without mention of status migrainosus 12/2020   Osteoporosis    Scoliosis     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 (HCC) 06/27/2023   Primary osteoarthritis of right hip 02/08/2021   COPD with acute exacerbation (HCC) 02/02/2021   Abnormal screening CT of chest 02/09/2020   Healthcare maintenance 02/09/2020   COPD with chronic bronchitis and emphysema (HCC) 10/28/2019   Tobacco abuse  10/28/2019   Cervical spondylosis without myelopathy 08/04/2013   Intractable migraine without aura 03/13/2013    Past Surgical History:  Procedure Laterality Date   ABDOMINAL HYSTERECTOMY     BRONCHIAL BIOPSY  06/19/2023   Procedure: BRONCHIAL BIOPSIES;  Surgeon: Denson Flake, MD;  Location: [facility];  Service: Pulmonary;;   BRONCHIAL BRUSHINGS  06/19/2023   Procedure: BRONCHIAL BRUSHINGS;  Surgeon: Denson Flake, MD;  Location: [facility];  Service: Pulmonary;;   BRONCHIAL NEEDLE ASPIRATION BIOPSY  06/19/2023   Procedure: BRONCHIAL NEEDLE ASPIRATION BIOPSIES;  Surgeon: Denson Flake, MD;  Location: [facility];  Service: Pulmonary;;   BRONCHIAL WASHINGS  06/19/2023   Procedure: BRONCHIAL WASHINGS;  Surgeon: Denson Flake, MD;  Location: [facility];  Service: Pulmonary;;   CARPAL TUNNEL RELEASE Left    CESAREAN SECTION     x 1   COLONOSCOPY     FIDUCIAL MARKER PLACEMENT  06/19/2023   Procedure: FIDUCIAL MARKER PLACEMENT;  Surgeon: Denson Flake, MD;  Location: [facility];  Service: Pulmonary;;   left ring finger surgery     w/ pins   LUMBAR LAMINECTOMY     MULTIPLE TOOTH EXTRACTIONS     has full dentures   NECK SURGERY     x 2   TOTAL HIP ARTHROPLASTY  Right 02/08/2021   Procedure: RIGHT TOTAL HIP ARTHROPLASTY ANTERIOR APPROACH;  Surgeon: Dayne Even, MD;  Location: [facility];  Service: Orthopedics;  Laterality: Right;   TUBAL LIGATION     ULNAR NERVE TRANSPOSITION Left     OB History   No obstetric history on file.      Home Medications    Prior to Admission medications   Medication Sig Start Date End Date Taking? Authorizing Provider  AIMOVIG 140 MG/ML SOAJ  12/01/23   [provider]  atorvastatin  (LIPITOR) 10 MG tablet Take 10 mg by mouth daily. 01/05/24   [provider]  butalbital-acetaminophen -caffeine (FIORICET) 50-325-40 MG tablet Take 1 tablet by mouth in the morning and at bedtime. 01/07/24 03/07/24  [provider]   doxycycline  (VIBRAMYCIN ) 100 MG capsule Take 1 capsule (100 mg total) by mouth 2 (two) times daily. 02/04/24  Yes Vernestine Gondola, PA-C  predniSONE  (DELTASONE ) 20 MG tablet Take 2 tablets (40 mg total) by mouth daily with breakfast for 5 days. 02/04/24 02/09/24 Yes Vernestine Gondola, PA-C  albuterol  (VENTOLIN  HFA) 108 (90 Base) MCG/ACT inhaler INHALE 1 TO 2 PUFFS EVERY 6 HOURS AS NEEDED FOR WHEEZING OR SHORTNESS OF BREATH. 01/08/23   Rosa College D, MD  aspirin  EC 81 MG tablet Take 81 mg by mouth daily. Swallow whole.    [provider]  atorvastatin  (LIPITOR) 20 MG tablet Take 1 tablet (20 mg total) by mouth daily. 11/14/23   Swinyer, Leilani Punter, NP  Biotin 1000 MCG CHEW Chew 1,000 mcg by mouth daily.    [provider]  Budeson-Glycopyrrol-Formoterol  (BREZTRI  AEROSPHERE) 160-9-4.8 MCG/ACT AERO Inhale 2 puffs into the lungs 2 (two) times daily. 06/29/23   Raejean Bullock, NP  butalbital-acetaminophen -caffeine (FIORICET) 50-325-40 MG tablet Take 1 tablet by mouth 2 (two) times daily as needed for headache or migraine.    [provider]  cyanocobalamin (VITAMIN B12) 1000 MCG tablet Take 2,000 mcg by mouth daily.    [provider]  dextromethorphan-guaiFENesin (MUCINEX DM) 30-600 MG 12hr tablet Take 1 tablet by mouth 2 (two) times daily as needed for cough.    [provider]  diclofenac (VOLTAREN) 75 MG EC tablet Take 75 mg by mouth 2 (two) times daily.    [provider]  diphenhydrAMINE (BENADRYL) 25 mg capsule Take 25 mg by mouth every 6 (six) hours as needed for allergies.    [provider]  Erenumab-aooe (AIMOVIG, 140 MG DOSE, [place]) Inject 140 mg into the skin every 30 (thirty) days.    [provider]  famotidine (PEPCID) 20 MG tablet Take 20 mg by mouth 2 (two) times daily as needed for heartburn or indigestion. 06/03/23   [provider]  lamoTRIgine (LAMICTAL) 100 MG tablet Take 100 mg by mouth 2 (two) times daily.      [provider]  metoprolol  tartrate (LOPRESSOR ) 100 MG tablet Take one (1) tablet by mouth (100 mg ) 2 hours prior to CT scan. Patient not taking: Reported on 12/24/2023 11/14/23   Swinyer, Leilani Punter, NP  mirtazapine  (REMERON ) 45 MG tablet Take 1 tablet (45 mg total) by mouth at bedtime. 06/19/23   Denson Flake, MD  ondansetron  (ZOFRAN ) 4 MG tablet Take 4 mg by mouth every 8 (eight) hours as needed for nausea or vomiting.    [provider]  Oxycodone  HCl 10 MG TABS Take 10 mg by mouth every 6 (six) hours. 12/27/20   [provider]  Respiratory Therapy Supplies (  FLUTTER) DEVI Use as directed. 02/09/20   Raejean Bullock, NP  SUMAtriptan  (IMITREX ) 100 MG tablet TAKE 1 TABLET (100 MG TOTAL) BY MOUTH AS NEEDED FOR MIGRAINE. Patient taking differently: Take 100 mg by mouth every 2 (two) hours as needed for migraine. 01/11/15   Brian Campanile, MD  tiZANidine  (ZANAFLEX ) 4 MG tablet Take 4 mg by mouth 3 (three) times daily. 10/27/23   [provider]    Family History Family History  Problem Relation Age of Onset   Cancer Father    Cancer Mother     Social History Social History   Tobacco Use   Smoking status: Former    Current packs/day: 1.00    Average packs/day: 1 pack/day for 46.0 years (46.0 ttl pk-yrs)    Types: Cigarettes   Smokeless tobacco: Never  Vaping Use   Vaping status: Never Used  Substance Use Topics   Alcohol use: Not Currently    Alcohol/week: 7.0 standard drinks of alcohol    Types: 7 Standard drinks or equivalent per week    Comment: beer/mixed drinks   Drug use: No     Allergies   Codeine, Topamax [topiramate], and Lyrica [pregabalin]   Review of Systems Review of Systems  Constitutional:  Negative for chills and fever.  Eyes:  Negative for discharge and redness.  Respiratory:  Negative for shortness of breath.   Gastrointestinal:  Negative for abdominal pain, nausea and vomiting.  Skin:  Positive for color change.  Negative for wound.  Neurological:  Negative for numbness.     Physical Exam Triage Vital Signs ED Triage Vitals  Encounter Vitals Group     BP 02/04/24 1914 (!) 174/87     Systolic BP Percentile --      Diastolic BP Percentile --      Pulse Rate 02/04/24 1914 75     Resp 02/04/24 1914 18     Temp 02/04/24 1914 98 F (36.7 C)     Temp Source 02/04/24 1914 Oral     SpO2 02/04/24 1914 98 %     Weight 02/04/24 1912 139 lb 8.8 oz (63.3 kg)     Height 02/04/24 1912 5\' 1"  (1.549 m)     Head Circumference --      Peak Flow --      Pain Score 02/04/24 1912 0     Pain Loc --      Pain Education --      Exclude from Growth Chart --    No data found.  Updated Vital Signs BP (!) 174/87 (BP Location: Left Arm)   Pulse 75   Temp 98 F (36.7 C) (Oral)   Resp 18   Ht 5\' 1"  (1.549 m)   Wt 139 lb 8.8 oz (63.3 kg)   SpO2 98%   BMI 26.37 kg/m   Visual Acuity Right Eye Distance:   Left Eye Distance:   Bilateral Distance:    Right Eye Near:   Left Eye Near:    Bilateral Near:     Physical Exam Vitals and nursing note reviewed.  Constitutional:      General: She is not in acute distress.    Appearance: Normal appearance. She is not ill-appearing.  HENT:     Head: Normocephalic and atraumatic.  Eyes:     Conjunctiva/sclera: Conjunctivae normal.  Cardiovascular:     Rate and Rhythm: Normal rate.  Pulmonary:     Effort: Pulmonary effort is normal. No respiratory distress.  Skin:  Comments: Diffuse erythema noted to bilateral forearms with excoriation noted, similar appearance to left lower leg posteriorly with minimal swelling appreciated.  Neurological:     Mental Status: She is alert.  Psychiatric:        Mood and Affect: Mood normal.        Behavior: Behavior normal.        Thought Content: Thought content normal.      UC Treatments / Results  Labs (all labs ordered are listed, but only abnormal results are displayed) Labs Reviewed - No data to  display  EKG   Radiology No results found.  Procedures Procedures (including critical care time)  Medications Ordered in UC Medications - No data to display  Initial Impression / Assessment and Plan / UC Course  I have reviewed the triage vital signs and the nursing notes.  Pertinent labs & imaging results that were available during my care of the patient were reviewed by me and considered in my medical decision making (see chart for details).    Unclear etiology of symptoms but given significant itching concerns for likely contact dermatitis.  Will treat with steroid burst but also with doxycycline  to cover possible cellulitis.  Advised follow-up if no gradual improvement or with any worsening symptoms.  Advised her to discontinue use of topical alcohol as this may be causing increased itching.  Okay to use topical OTC hydrocortisone cream if needed.  Patient expresses understanding.  Final Clinical Impressions(s) / UC Diagnoses   Final diagnoses:  Dermatitis  Cellulitis of left lower extremity   Discharge Instructions   None    ED Prescriptions     Medication Sig Dispense Auth. Provider   doxycycline  (VIBRAMYCIN ) 100 MG capsule Take 1 capsule (100 mg total) by mouth 2 (two) times daily. 20 capsule Jami Mcclintock F, PA-C   predniSONE  (DELTASONE ) 20 MG tablet Take 2 tablets (40 mg total) by mouth daily with breakfast for 5 days. 10 tablet Vernestine Gondola, PA-C      PDMP not reviewed this encounter.   Vernestine Gondola, PA-C 02/04/24 1952    Vernestine Gondola, PA-C 02/04/24 [phone]

## 2024-02-04 NOTE — ED Triage Notes (Signed)
"  I had got bitten and scratched by a cat in October the and since then having a reaction/swelling/redness at or around site". Site: Left ankle and left wrist. The cat bite and scratch (found cat and taken in as rescue), not taken to Veterinarian yet.

## 2024-02-08 ENCOUNTER — Ambulatory Visit: Payer: Medicare HMO

## 2024-02-11 ENCOUNTER — Telehealth: Payer: Self-pay | Admitting: Internal Medicine

## 2024-02-11 NOTE — Telephone Encounter (Signed)
[facility] [place] Clinical Research Coordinator note:   This visit for Subject [name] with DOB: 10-02-1961 on 02/11/2024. Subject contacted regarding [id number] to inform that Principal Investigator has changed to Dr. Delton Coombes. Unable to leave voicemail. Will try calling again.

## 2024-02-14 ENCOUNTER — Telehealth: Payer: Medicare Other | Admitting: Physician Assistant

## 2024-02-14 DIAGNOSIS — L2989 Other pruritus: Secondary | ICD-10-CM

## 2024-02-14 MED ORDER — HYDROXYZINE PAMOATE 25 MG PO CAPS
25.0000 mg | ORAL_CAPSULE | Freq: Three times a day (TID) | ORAL | 0 refills | Status: DC | PRN
Start: 2024-02-14 — End: 2024-04-01

## 2024-02-14 NOTE — Progress Notes (Signed)
Virtual Visit Consent   BOOT[place]GLOWN, you are scheduled for a virtual visit with a [facility] provider today. Just as with appointments in the office, your consent must be obtained to participate. Your consent will be active for this visit and any virtual visit you may have with one of our providers in the next 365 days. If you have a MyChart account, a copy of this consent can be sent to you electronically.  As this is a virtual visit, video technology does not allow for your provider to perform a traditional examination. This may limit your provider's ability to fully assess your condition. If your provider identifies any concerns that need to be evaluated in person or the need to arrange testing (such as labs, EKG, etc.), we will make arrangements to do so. Although advances in technology are sophisticated, we cannot ensure that it will always work on either your end or our end. If the connection with a video visit is poor, the visit may have to be switched to a telephone visit. With either a video or telephone visit, we are not always able to ensure that we have a secure connection.  By engaging in this virtual visit, you consent to the provision of healthcare and authorize for your insurance to be billed (if applicable) for the services provided during this visit. Depending on your insurance coverage, you may receive a charge related to this service.  I need to obtain your verbal consent now. Are you willing to proceed with your visit today? [name] has provided verbal consent on 02/14/2024 for a virtual visit (video or telephone). Piedad Climes, [place]  Date: 02/14/2024 4:56 PM   Virtual Visit via Video Note   I, Piedad Climes, connected with  [name]  ([id number], 03-05-1961) on 02/14/24 at  4:30 PM EST by a video-enabled telemedicine application and verified that I am speaking with the correct person using two identifiers.  Location: Patient: Virtual Visit Location  Patient: Home Provider: Virtual Visit Location Provider: Home Office   I discussed the limitations of evaluation and management by telemedicine and the availability of in person appointments. The patient expressed understanding and agreed to proceed.    History of Present Illness: [name] is a 63 y.o. who identifies as a female who was assigned female at birth, and is being seen today for persistent rash over the past several months that is red and itchy. Is located on her distal arms bilaterally as well as her left leg. Does not note rash elsewhere. Is very pruritic and sometimes uncomfortable. Denies any known contact with individuals having similar symptoms. Notes she has not changed hygiene products but had changed her detergent prior to rash onset. She has stopped use of this product. Nothing seems to make it better or worse. She was seen 10 days ago at urgent care for this and due to some swelling in her left leg after scratching her leg. Was placed on prednisone for dermatitis and given antibiotic (Doxycycline) for suspected cellulitis of the leg. She notes taking both as directed. Notes leg is doing better, still with some residual swelling but improved. No change in pruritus or rash. Denies fever, chills. Is really unsure what to do. Her PCP is retiring so she cannot get in with him.   HPI: HPI  Problems:  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 (HCC) 06/27/2023   Primary  osteoarthritis of right hip 02/08/2021   COPD with acute exacerbation (HCC) 02/02/2021   Abnormal screening CT of chest 02/09/2020   Healthcare maintenance 02/09/2020   COPD with chronic bronchitis and emphysema (HCC) 10/28/2019   Tobacco abuse 10/28/2019   Cervical spondylosis without myelopathy 08/04/2013   Intractable migraine without aura 03/13/2013    Allergies:  Allergies  Allergen Reactions   Codeine Nausea Only   Topamax [Topiramate] Hives    Lyrica [Pregabalin] Swelling   Medications:  Current Outpatient Medications:    hydrOXYzine (VISTARIL) 25 MG capsule, Take 1 capsule (25 mg total) by mouth every 8 (eight) hours as needed., Disp: 30 capsule, Rfl: 0   AIMOVIG 140 MG/ML SOAJ, , Disp: , Rfl:    albuterol (VENTOLIN HFA) 108 (90 Base) MCG/ACT inhaler, INHALE 1 TO 2 PUFFS EVERY 6 HOURS AS NEEDED FOR WHEEZING OR SHORTNESS OF BREATH., Disp: 54 g, Rfl: 3   aspirin EC 81 MG tablet, Take 81 mg by mouth daily. Swallow whole., Disp: , Rfl:    atorvastatin (LIPITOR) 10 MG tablet, Take 10 mg by mouth daily., Disp: , Rfl:    atorvastatin (LIPITOR) 20 MG tablet, Take 1 tablet (20 mg total) by mouth daily., Disp: 90 tablet, Rfl: 3   Biotin 1000 MCG CHEW, Chew 1,000 mcg by mouth daily., Disp: , Rfl:    Budeson-Glycopyrrol-Formoterol (BREZTRI AEROSPHERE) 160-9-4.8 MCG/ACT AERO, Inhale 2 puffs into the lungs 2 (two) times daily., Disp: 10.7 g, Rfl: 6   butalbital-acetaminophen-caffeine (FIORICET) 50-325-40 MG tablet, Take 1 tablet by mouth 2 (two) times daily as needed for headache or migraine., Disp: , Rfl:    butalbital-acetaminophen-caffeine (FIORICET) 50-325-40 MG tablet, Take 1 tablet by mouth in the morning and at bedtime., Disp: , Rfl:    cyanocobalamin (VITAMIN B12) 1000 MCG tablet, Take 2,000 mcg by mouth daily., Disp: , Rfl:    dextromethorphan-guaiFENesin (MUCINEX DM) 30-600 MG 12hr tablet, Take 1 tablet by mouth 2 (two) times daily as needed for cough., Disp: , Rfl:    diclofenac (VOLTAREN) 75 MG EC tablet, Take 75 mg by mouth 2 (two) times daily., Disp: , Rfl:    doxycycline (VIBRAMYCIN) 100 MG capsule, Take 1 capsule (100 mg total) by mouth 2 (two) times daily., Disp: 20 capsule, Rfl: 0   Erenumab-aooe (AIMOVIG, 140 MG DOSE, [place]), Inject 140 mg into the skin every 30 (thirty) days., Disp: , Rfl:    famotidine (PEPCID) 20 MG tablet, Take 20 mg by mouth 2 (two) times daily as needed for heartburn or indigestion., Disp: , Rfl:     lamoTRIgine (LAMICTAL) 100 MG tablet, Take 100 mg by mouth 2 (two) times daily. , Disp: , Rfl:    metoprolol tartrate (LOPRESSOR) 100 MG tablet, Take one (1) tablet by mouth (100 mg ) 2 hours prior to CT scan. (Patient not taking: Reported on 12/24/2023), Disp: 1 tablet, Rfl: 0   mirtazapine (REMERON) 45 MG tablet, Take 1 tablet (45 mg total) by mouth at bedtime., Disp: , Rfl:    ondansetron (ZOFRAN) 4 MG tablet, Take 4 mg by mouth every 8 (eight) hours as needed for nausea or vomiting., Disp: , Rfl:    Oxycodone HCl 10 MG TABS, Take 10 mg by mouth every 6 (six) hours., Disp: , Rfl:    Respiratory Therapy Supplies (FLUTTER) DEVI, Use as directed., Disp: 1 each, Rfl: 0   SUMAtriptan (IMITREX) 100 MG tablet, TAKE 1 TABLET (100 MG TOTAL) BY MOUTH AS NEEDED FOR MIGRAINE. (Patient taking differently: Take 100 mg  by mouth every 2 (two) hours as needed for migraine.), Disp: 9 tablet, Rfl: 0   tiZANidine (ZANAFLEX) 4 MG tablet, Take 4 mg by mouth 3 (three) times daily., Disp: , Rfl:   Observations/Objective: Patient is well-developed, well-nourished in no acute distress.  Resting comfortably at home.  Head is normocephalic, atraumatic.  No labored breathing. Speech is clear and coherent with logical content.  Patient is alert and oriented at baseline.  Patchy erythematous rash that seems mostly flat but a couple of scattered raised lesions, non-vesicular. This is noted ofr forearms bilaterally, anterior> posterior and her left leg. No rash noted elsewhere.   Assessment and Plan: 1. 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  Dispense: 30 capsule; Refill: 0  Chronic at this point. Non responsive to steroid treatment. No known trigger. She needs further evaluation to assess for atypical causes of rashes, AI issues, etc. Will give Rx hydroxyzine to help with itch and inflammation. Supportive  measures reviewed. Did walk her through scheduling portal and she now has PCP appt scheduled first thing this coming week. UC/ER precautions reviewed.   Follow Up Instructions: I discussed the assessment and treatment plan with the patient. The patient was provided an opportunity to ask questions and all were answered. The patient agreed with the plan and demonstrated an understanding of the instructions.  A copy of instructions were sent to the patient via MyChart unless otherwise noted below.   The patient was advised to call back or seek an in-person evaluation if the symptoms worsen or if the condition fails to improve as anticipated.    Piedad Climes, PA-C

## 2024-02-14 NOTE — Patient Instructions (Signed)
[name], thank you for joining Piedad Climes, PA-C for today's virtual visit.  While this provider is not your primary care provider (PCP), if your PCP is located in our provider database this encounter information will be shared with them immediately following your visit.   A [facility] MyChart account gives you access to today's visit and all your visits, tests, and labs performed at [facility] " click here if you don't have a [facility] MyChart account or go to [email or web address]  Consent: (Patient) [name] provided verbal consent for this virtual visit at the beginning of the encounter.  Current Medications:  Current Outpatient Medications:    AIMOVIG 140 MG/ML SOAJ, , Disp: , Rfl:    albuterol (VENTOLIN HFA) 108 (90 Base) MCG/ACT inhaler, INHALE 1 TO 2 PUFFS EVERY 6 HOURS AS NEEDED FOR WHEEZING OR SHORTNESS OF BREATH., Disp: 54 g, Rfl: 3   aspirin EC 81 MG tablet, Take 81 mg by mouth daily. Swallow whole., Disp: , Rfl:    atorvastatin (LIPITOR) 10 MG tablet, Take 10 mg by mouth daily., Disp: , Rfl:    atorvastatin (LIPITOR) 20 MG tablet, Take 1 tablet (20 mg total) by mouth daily., Disp: 90 tablet, Rfl: 3   Biotin 1000 MCG CHEW, Chew 1,000 mcg by mouth daily., Disp: , Rfl:    Budeson-Glycopyrrol-Formoterol (BREZTRI AEROSPHERE) 160-9-4.8 MCG/ACT AERO, Inhale 2 puffs into the lungs 2 (two) times daily., Disp: 10.7 g, Rfl: 6   butalbital-acetaminophen-caffeine (FIORICET) 50-325-40 MG tablet, Take 1 tablet by mouth 2 (two) times daily as needed for headache or migraine., Disp: , Rfl:    butalbital-acetaminophen-caffeine (FIORICET) 50-325-40 MG tablet, Take 1 tablet by mouth in the morning and at bedtime., Disp: , Rfl:    cyanocobalamin (VITAMIN B12) 1000 MCG tablet, Take 2,000 mcg by mouth daily., Disp: , Rfl:    dextromethorphan-guaiFENesin (MUCINEX DM) 30-600 MG 12hr tablet, Take 1 tablet by mouth 2 (two) times daily as needed for cough., Disp: , Rfl:     diclofenac (VOLTAREN) 75 MG EC tablet, Take 75 mg by mouth 2 (two) times daily., Disp: , Rfl:    diphenhydrAMINE (BENADRYL) 25 mg capsule, Take 25 mg by mouth every 6 (six) hours as needed for allergies., Disp: , Rfl:    doxycycline (VIBRAMYCIN) 100 MG capsule, Take 1 capsule (100 mg total) by mouth 2 (two) times daily., Disp: 20 capsule, Rfl: 0   Erenumab-aooe (AIMOVIG, 140 MG DOSE, [place]), Inject 140 mg into the skin every 30 (thirty) days., Disp: , Rfl:    famotidine (PEPCID) 20 MG tablet, Take 20 mg by mouth 2 (two) times daily as needed for heartburn or indigestion., Disp: , Rfl:    lamoTRIgine (LAMICTAL) 100 MG tablet, Take 100 mg by mouth 2 (two) times daily. , Disp: , Rfl:    metoprolol tartrate (LOPRESSOR) 100 MG tablet, Take one (1) tablet by mouth (100 mg ) 2 hours prior to CT scan. (Patient not taking: Reported on 12/24/2023), Disp: 1 tablet, Rfl: 0   mirtazapine (REMERON) 45 MG tablet, Take 1 tablet (45 mg total) by mouth at bedtime., Disp: , Rfl:    ondansetron (ZOFRAN) 4 MG tablet, Take 4 mg by mouth every 8 (eight) hours as needed for nausea or vomiting., Disp: , Rfl:    Oxycodone HCl 10 MG TABS, Take 10 mg by mouth every 6 (six) hours., Disp: , Rfl:    Respiratory Therapy Supplies (FLUTTER) DEVI, Use as directed., Disp: 1 each, Rfl: 0  SUMAtriptan (IMITREX) 100 MG tablet, TAKE 1 TABLET (100 MG TOTAL) BY MOUTH AS NEEDED FOR MIGRAINE. (Patient taking differently: Take 100 mg by mouth every 2 (two) hours as needed for migraine.), Disp: 9 tablet, Rfl: 0   tiZANidine (ZANAFLEX) 4 MG tablet, Take 4 mg by mouth 3 (three) times daily., Disp: , Rfl:    Medications ordered in this encounter:  No orders of the defined types were placed in this encounter.    *If you need refills on other medications prior to your next appointment, please contact your pharmacy*  Follow-Up: Call back or seek an in-person evaluation if the symptoms worsen or if the condition fails to improve as  anticipated.  [facility] [phone]  Other Instructions Please keep the skin clean and dry. Use the hydroxyzine as directed to help with itch.  Follow-up with new PCP office as scheduled for further evaluation and ongoing management. If anything worsening before then, please be seen at nearest urgent care or ER facility.   If you have been instructed to have an in-person evaluation today at a local Urgent Care facility, please use the link below. It will take you to a list of all of our available [facility], including address, phone number and hours of operation. Please do not delay care.  [facility]  If you or a family member do not have a primary care provider, use the link below to schedule a visit and establish care. When you choose a [facility] primary care physician or advanced practice provider, you gain a long-term partner in health. Find a Primary Care Provider  Learn more about [facility]'s in-office and virtual care options: [facility] - Get Care Now

## 2024-02-15 ENCOUNTER — Ambulatory Visit
Admission: RE | Admit: 2024-02-15 | Discharge: 2024-02-15 | Disposition: A | Discharge status: Home / Self Care | Payer: Medicare Other | Source: Ambulatory Visit | Attending: Family Medicine | Admitting: Family Medicine

## 2024-02-15 DIAGNOSIS — Z1231 Encounter for screening mammogram for malignant neoplasm of breast: Secondary | ICD-10-CM

## 2024-02-17 NOTE — Progress Notes (Deleted)
 Cardiology Office Note:  .   Date:  02/17/2024 ID:  Lindsay, Green [birth year], MRN [id number] PCP: Kaleen Mask, MD [facility] Providers Cardiologist:  None   Patient Profile: .      PMH Coronary artery disease Coronary CTA 12/11/2023 CAC score of 6.71 (51st percenitle) Minimal nonobstructive CAD (0-24 present) Focal (<24%) calcified plaque in proximal to mid OM Former tobacco abuse Pulmonary nodule concerning for carcinoma Bronchoscopy 06/19/23 revealed adenocarcinoma XRT Aortic atherosclerosis COPD  Hyperlipidemia  Referred to cardiology and seen by me on 11/14/23 as new patient consult for coronary artery calcification noted on a recent CT scan. She reported chronic fatigue and exertional dyspnea. Will feel her heart rate increase at times which makes her short of breath.  She wonders if this is a panic attack.  Long-time cigarette smoker, she has received 3 radiation treatments for lung cancer.  She quit smoking a few months ago. She occasionally takes a few puffs but does not inhale. She feels pressure in her chest at times. Notes occasional ankle swelling. Has slept in a slightly elevated position for a while. Is not very active, only with housework. Does not know how long cholesterol has been elevated but started on atorvastatin in October. Family history of heart disease with father who had a cardiac stent. Started taking aspirin for risk reduction after seeing a commercial on TV.  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 Does use olive or canola oil. Admits to frequently eating bacon and sausage as her husband and her particularly enjoyed breakfast food.  ASCVD risk score was 9.9%.  Due to risk factors of tobacco abuse, hyperlipidemia, and coronary calcification on CT as well as symptoms of shortness of breath and fatigue, coronary CTA was obtained on12/17/2024 which revealed mild non-obstructive CAD.  Atorvastatin was increased  to 20 mg daily and she was advised to return for lipid testing in 2-3 months.       History of Present Illness: .   [name] is a very pleasant 63 y.o. female  who is here today for    ROS: See HPI       Studies Reviewed: .           Risk Assessment/Calculations:     No BP recorded.  {Refresh Note OR Click here to enter BP  :1}***       Physical Exam:   VS: There were no vitals taken for this visit.  Wt Readings from Last 3 Encounters:  02/04/24 139 lb 8.8 oz (63.3 kg)  12/24/23 139 lb 9.6 oz (63.3 kg)  11/14/23 137 lb 6.4 oz (62.3 kg)     GEN: Well nourished, well developed in no acute distress NECK: No JVD; No carotid bruits CARDIAC: RRR, no murmurs, rubs, gallops RESPIRATORY:  Clear to auscultation without rales, wheezing or rhonchi  ABDOMEN: Soft, non-tender, non-distended EXTREMITIES:  No edema; No deformity     ASSESSMENT AND PLAN: .    Coronary artery disease: Mild calcifications noted on lung CT.  Due to chest pressure and shortness of breath, as well as risk factors of tobacco abuse and hyperlipidemia, coronary CTA was obtained which revealed CAC score of 6.71 (51st percentile) with minimal non-obstructive CAD.   Dyslipidemia LDL goal < 70/Aortic atherosclerosis: Lipid panel 10/09/2023 with total cholesterol 226, triglycerides 98, HDL 64, and LDL 145.  She was told LDL was mildly elevated and to start atorvastatin 10 mg daily.  I have encouraged her  to increase atorvastatin to 20 mg daily for now as we await CT results. Goal LDL < 70. Encouraged Mediterranean style diet and limiting intake of saturated fat and bacon, sausage, full fat dairy, butter. Will plan to check NMR lipid panel at next office visit.   Tobacco abuse: 46-pack-year history with history of COPD and lung adenocarcinoma.  She reports she has quit smoking. I congratulated her on this achievement.   CV Risk Assessment: ASCVD risk score 9.9%.  She was recently started on low-dose atorvastatin for  hyperlipidemia.  Explained 10 mg likely would not be enough to effectively lower LDL.  We will increase to 20 mg for now and await CT results for further risk stratification.  She recently quit smoking, congratulated her on this achievement. She reports BP has always been well-controlled.    Plan/Goals: Decrease intake of saturated fat Increase physical activity to aim for walking 150 minutes each week     Disposition: ***  Signed, Eligha Bridegroom, NP-C

## 2024-02-18 ENCOUNTER — Encounter: Payer: Self-pay | Admitting: Urgent Care

## 2024-02-18 ENCOUNTER — Ambulatory Visit (INDEPENDENT_AMBULATORY_CARE_PROVIDER_SITE_OTHER): Payer: Medicare Other | Admitting: Urgent Care

## 2024-02-18 VITALS — BP 136/81 | HR 77 | Ht 62.0 in | Wt 143.8 lb

## 2024-02-18 DIAGNOSIS — L309 Dermatitis, unspecified: Secondary | ICD-10-CM

## 2024-02-18 DIAGNOSIS — L03116 Cellulitis of left lower limb: Secondary | ICD-10-CM | POA: Diagnosis not present

## 2024-02-18 LAB — CBC WITH DIFFERENTIAL/PLATELET
Basophils Absolute: 0 10*3/uL (ref 0.0–0.1)
Basophils Relative: 0.8 % (ref 0.0–3.0)
Eosinophils Absolute: 0.3 10*3/uL (ref 0.0–0.7)
Eosinophils Relative: 4.7 % (ref 0.0–5.0)
HCT: 37.4 % (ref 36.0–46.0)
Hemoglobin: 12.4 g/dL (ref 12.0–15.0)
Lymphocytes Relative: 15.9 % (ref 12.0–46.0)
Lymphs Abs: 0.9 10*3/uL (ref 0.7–4.0)
MCHC: 33 g/dL (ref 30.0–36.0)
MCV: 93.8 fL (ref 78.0–100.0)
Monocytes Absolute: 0.4 10*3/uL (ref 0.1–1.0)
Monocytes Relative: 6.4 % (ref 3.0–12.0)
Neutro Abs: 4.3 10*3/uL (ref 1.4–7.7)
Neutrophils Relative %: 72.2 % (ref 43.0–77.0)
Platelets: 268 10*3/uL (ref 150.0–400.0)
RBC: 3.99 Mil/uL (ref 3.87–5.11)
RDW: 15.4 % (ref 11.5–15.5)
WBC: 5.9 10*3/uL (ref 4.0–10.5)

## 2024-02-18 LAB — COMPREHENSIVE METABOLIC PANEL
ALT: 15 U/L (ref 0–35)
AST: 15 U/L (ref 0–37)
Albumin: 4.3 g/dL (ref 3.5–5.2)
Alkaline Phosphatase: 99 U/L (ref 39–117)
BUN: 12 mg/dL (ref 6–23)
CO2: 28 meq/L (ref 19–32)
Calcium: 9.3 mg/dL (ref 8.4–10.5)
Chloride: 108 meq/L (ref 96–112)
Creatinine, Ser: 0.73 mg/dL (ref 0.40–1.20)
GFR: 86.9 mL/min (ref >60.00)
Glucose, Bld: 89 mg/dL (ref 70–99)
Potassium: 3.8 meq/L (ref 3.5–5.1)
Sodium: 144 meq/L (ref 135–145)
Total Bilirubin: 0.4 mg/dL (ref 0.2–1.2)
Total Protein: 6.5 g/dL (ref 6.0–8.3)

## 2024-02-18 MED ORDER — METRONIDAZOLE 500 MG PO TABS: 500.0000 mg | ORAL_TABLET | Freq: Three times a day (TID) | ORAL | 0 refills | Status: DC

## 2024-02-18 MED ORDER — CLOBETASOL PROPIONATE 0.05 % EX OINT
1.0000 | TOPICAL_OINTMENT | Freq: Two times a day (BID) | CUTANEOUS | 0 refills | Status: DC
Start: 2024-02-18 — End: 2024-04-29

## 2024-02-18 MED ORDER — AMOXICILLIN-POT CLAVULANATE 875-125 MG PO TABS: 1.0000 | ORAL_TABLET | Freq: Two times a day (BID) | ORAL | 0 refills | Status: DC

## 2024-02-18 NOTE — Patient Instructions (Signed)
 Start taking the augmentin twice daily with food. Take flagyl twice daily as well. Drink plenty of water on the antibiotics. Do not drink any alcohol while taking the medications as this will make you have nausea and likely vomit.  Only use hydroxyzine as needed - this medication can make you feel drowsy.  Apply the topical steroid cream to the affected areas of your arm and leg twice daily until follow up. Do NOT occlude with a bandage  Please return in one week for a recheck.  If you develop fever, chills, confusion, or worsening infection, please head to the ER

## 2024-02-18 NOTE — Progress Notes (Signed)
 New Patient Office Visit  Subjective:  Patient ID: [name], female    DOB: 12-05-1961  Age: 63 y.o. MRN: [id number]  CC:  Chief Complaint  Patient presents with   Rash     New pt. Will be establishing closer to [place]. She has a rash on her left arm and legs that's been there for a while. It does itch and has been causing some swelling.    HPI [name] presents to discuss persistent rash. She hopes to establish care with a PCP closer to her home.  Discussed the use of AI scribe software for clinical note transcription with the patient, who gave verbal consent to proceed.  History of Present Illness   [name] is a 63 year old female who presents with a persistent rash and itching following a cat bite.  She has a persistent rash and severe itching that began in late January to early February, localized to her left forearm and left lower leg distal to knee. She describes the sensation as 'scratching myself to death' and notes that it feels 'almost like burns'. The rash has not spread to her neck or chest. No fever is reported.  The onset of the rash is associated with a cat bite and scratch from a recently adopted cat that is not up to date on vaccinations. The bite site reportedly 'wouldn't heal up'.  She initially sought care at [facility] urgent care, where she was prescribed doxycycline and prednisone. A subsequent video visit resulted in a prescription for hydroxyzine, which initially helped with the itching. Despite these treatments, the rash persists. She has tried various over-the-counter lotions and treatments, including poison oak remedies and CeraVe lotion, but reports that 'none of that stuff touched it'. She is currently using hydroxyzine for itch relief and applying calamine lotion to the affected areas. She has not used any topical steroids or other prescription topical treatments. It appears that her last tetanus vaccination date was in October 2013, and was not  updated at her UC visit. Additionally rabies vaccination was also not administered. Pt states the cat is fine.  She has a history of COPD and emphysema, contributing to chronic shortness of breath. She does not recall any new shortness of breath beyond her baseline. Denies any systemic symptoms.     Outpatient Encounter Medications as of 02/18/2024  Medication Sig   AIMOVIG 140 MG/ML SOAJ    albuterol (VENTOLIN HFA) 108 (90 Base) MCG/ACT inhaler INHALE 1 TO 2 PUFFS EVERY 6 HOURS AS NEEDED FOR WHEEZING OR SHORTNESS OF BREATH.   amoxicillin-clavulanate (AUGMENTIN) 875-125 MG tablet Take 1 tablet by mouth 2 (two) times daily with a meal for 7 days.   aspirin EC 81 MG tablet Take 81 mg by mouth daily. Swallow whole.   atorvastatin (LIPITOR) 20 MG tablet Take 1 tablet (20 mg total) by mouth daily.   Biotin 1000 MCG CHEW Chew 1,000 mcg by mouth daily.   Budeson-Glycopyrrol-Formoterol (BREZTRI AEROSPHERE) 160-9-4.8 MCG/ACT AERO Inhale 2 puffs into the lungs 2 (two) times daily.   butalbital-acetaminophen-caffeine (FIORICET) 50-325-40 MG tablet Take 1 tablet by mouth in the morning and at bedtime.   clobetasol ointment (TEMOVATE) 0.05 % Apply 1 Application topically 2 (two) times daily. Do not use >14 days   cyanocobalamin (VITAMIN B12) 1000 MCG tablet Take 2,000 mcg by mouth daily.   dextromethorphan-guaiFENesin (MUCINEX DM) 30-600 MG 12hr tablet Take 1 tablet by mouth 2 (two) times daily as needed for cough.  diclofenac (VOLTAREN) 75 MG EC tablet Take 75 mg by mouth 2 (two) times daily.   Erenumab-aooe (AIMOVIG, 140 MG DOSE, [place]) Inject 140 mg into the skin every 30 (thirty) days.   famotidine (PEPCID) 20 MG tablet Take 20 mg by mouth 2 (two) times daily as needed for heartburn or indigestion.   hydrOXYzine (VISTARIL) 25 MG capsule Take 1 capsule (25 mg total) by mouth every 8 (eight) hours as needed.   lamoTRIgine (LAMICTAL) 100 MG tablet Take 100 mg by mouth 2 (two) times daily.    metroNIDAZOLE  (FLAGYL) 500 MG tablet Take 1 tablet (500 mg total) by mouth 3 (three) times daily for 7 days.   mirtazapine (REMERON) 45 MG tablet Take 1 tablet (45 mg total) by mouth at bedtime.   Oxycodone HCl 10 MG TABS Take 10 mg by mouth every 6 (six) hours.   Respiratory Therapy Supplies (FLUTTER) DEVI Use as directed.   SUMAtriptan (IMITREX) 100 MG tablet TAKE 1 TABLET (100 MG TOTAL) BY MOUTH AS NEEDED FOR MIGRAINE. (Patient taking differently: Take 100 mg by mouth every 2 (two) hours as needed for migraine.)   tiZANidine (ZANAFLEX) 4 MG tablet Take 4 mg by mouth 3 (three) times daily.   atorvastatin (LIPITOR) 10 MG tablet Take 10 mg by mouth daily.   butalbital-acetaminophen-caffeine (FIORICET) 50-325-40 MG tablet Take 1 tablet by mouth 2 (two) times daily as needed for headache or migraine.   metoprolol tartrate (LOPRESSOR) 100 MG tablet Take one (1) tablet by mouth (100 mg ) 2 hours prior to CT scan. (Patient not taking: Reported on 12/24/2023)   ondansetron (ZOFRAN) 4 MG tablet Take 4 mg by mouth every 8 (eight) hours as needed for nausea or vomiting. (Patient not taking: Reported on 02/18/2024)   [DISCONTINUED] doxycycline (VIBRAMYCIN) 100 MG capsule Take 1 capsule (100 mg total) by mouth 2 (two) times daily. (Patient not taking: Reported on 02/18/2024)   No facility-administered encounter medications on file as of 02/18/2024.    Past Medical History:  Diagnosis Date   Allergy    codeine,topamax,lyrica   Anxiety    Arthritis    Cancer of cervix (HCC)    Cataract    Cervical spondylosis without myelopathy 08/04/2013   COPD (chronic obstructive pulmonary disease) (HCC)    Coronary artery calcification    DDD (degenerative disc disease), lumbar    Depression    Dyspnea    on exertion   Emphysema of lung (HCC)    Fibromyalgia    GERD (gastroesophageal reflux disease)    History of radiation therapy    Right lung- 08/13/23-08/20/23- Dr. Antony Blackbird   Migraine without aura, with intractable  migraine, so stated, without mention of status migrainosus 12/2020   Osteoporosis    Scoliosis     Past Surgical History:  Procedure Laterality Date   ABDOMINAL HYSTERECTOMY     BRONCHIAL BIOPSY  06/19/2023   Procedure: BRONCHIAL BIOPSIES;  Surgeon: Leslye Peer, MD;  Location: [facility];  Service: Pulmonary;;   BRONCHIAL BRUSHINGS  06/19/2023   Procedure: BRONCHIAL BRUSHINGS;  Surgeon: Leslye Peer, MD;  Location: [facility] ENDOSCOPY;  Service: Pulmonary;;   BRONCHIAL NEEDLE ASPIRATION BIOPSY  06/19/2023   Procedure: BRONCHIAL NEEDLE ASPIRATION BIOPSIES;  Surgeon: Leslye Peer, MD;  Location[facility]COPY;  Service: Pulmonary;;   BRONCHIAL WASHINGS  06/19/2023   Procedure: BRONCHIAL WASHINGS;  Surgeon: Leslye Peer, MD;  Location: [facility];  Service: Pulmonary;;   CARPAL TUNNEL RELEASE Left    CESAREAN  SECTION     x 1   COLONOSCOPY     FIDUCIAL MARKER PLACEMENT  06/19/2023   Procedure: FIDUCIAL MARKER PLACEMENT;  Surgeon: Leslye Peer, MD;  Location: [facility] ENDOSCOPY;  Service: Pulmonary;;   JOINT REPLACEMENT     right hip   left ring finger surgery     w/ pins   LUMBAR LAMINECTOMY     MULTIPLE TOOTH EXTRACTIONS     has full dentures   NECK SURGERY     x 2   SPINE SURGERY     front and back neck   TOTAL HIP ARTHROPLASTY Right 02/08/2021   Procedure: RIGHT TOTAL HIP ARTHROPLASTY ANTERIOR APPROACH;  Surgeon: Marcene Corning, MD;  Location: [facility];  Service: Orthopedics;  Laterality: Right;   TUBAL LIGATION     ULNAR NERVE TRANSPOSITION Left     Family History  Problem Relation Age of Onset   Cancer Father    Cancer Mother     Social History   Socioeconomic History   Marital status: Married    Spouse name: Fredrik Cove    Number of children: 1   Years of education: 9   Highest education level: 12th grade  Occupational History    Comment: Does not work  Tobacco Use   Smoking status: Former    Current packs/day: 0.00    Average packs/day: 1 pack/day for 49.0  years (49.0 ttl pk-yrs)    Types: Cigarettes    Quit date: 08/09/2023    Years since quitting: 0.5   Smokeless tobacco: Never  Vaping Use   Vaping status: Never Used  Substance and Sexual Activity   Alcohol use: Yes    Alcohol/week: 9.0 standard drinks of alcohol    Types: 2 Cans of beer, 7 Standard drinks or equivalent per week    Comment: beer/mixed drinks   Drug use: No   Sexual activity: Not Currently    Birth control/protection: Post-menopausal    Comment: Hysterectomy  Other Topics Concern   Not on file  Social History Narrative   Patient lives at home Fredrik Cove her husband.    Patient has 1 child.    Patient has 9 th grade education.    Patient is currently not currently working.             Social Drivers of Corporate investment banker Strain: Low Risk  (02/17/2024)   Overall Financial Resource Strain (CARDIA)    Difficulty of Paying Living Expenses: Not very hard  Food Insecurity: No Food Insecurity (02/17/2024)   Hunger Vital Sign    Worried About Running Out of Food in the Last Year: Never true    Ran Out of Food in the Last Year: Never true  Transportation Needs: No Transportation Needs (02/17/2024)   PRAPARE - Administrator, Civil Service (Medical): No    Lack of Transportation (Non-Medical): No  Physical Activity: Inactive (02/17/2024)   Exercise Vital Sign    Days of Exercise per Week: 0 days    Minutes of Exercise per Session: 0 min  Stress: No Stress Concern Present (02/17/2024)   [employer] of Occupational Health - Occupational Stress Questionnaire    Feeling of Stress : Only a little  Social Connections: Unknown (02/17/2024)   Social Connection and Isolation Panel [NHANES]    Frequency of Communication with Friends and Family: More than three times a week    Frequency of Social Gatherings with Friends and Family: Patient declined    Attends Religious  Services: Patient declined    Active Member of Clubs or Organizations: No    Attends  Banker Meetings: Not on file    Marital Status: Married  Intimate Partner Violence: Not At Risk (06/26/2023)   Humiliation, Afraid, Rape, and Kick questionnaire    Fear of Current or Ex-Partner: No    Emotionally Abused: No    Physically Abused: No    Sexually Abused: No    ROS: as noted in HPI  Objective:  BP 136/81   Pulse 77   Ht 5\' 2"  (1.575 m)   Wt 143 lb 12.8 oz (65.2 kg)   SpO2 97%   BMI 26.30 kg/m   Physical Exam Vitals and nursing note reviewed. Exam conducted with a chaperone present.  Constitutional:      General: She is not in acute distress.    Appearance: Normal appearance. She is normal weight. She is not ill-appearing, toxic-appearing or diaphoretic.  HENT:     Head: Normocephalic and atraumatic.  Cardiovascular:     Rate and Rhythm: Normal rate.     Pulses: Normal pulses.  Pulmonary:     Effort: Pulmonary effort is normal. No respiratory distress.  Musculoskeletal:     Left lower leg: Edema (mild edema surrounding erythematous skin) present.  Skin:    General: Skin is warm.     Findings: Erythema and rash present.     Comments: See photos below; erythema, warmth some purpura noted to distal lower extremity. Numerous excoriations noted. Prior puncture wound to L Achilles region noted  L forearm with similar appearing rash to dorsal aspect only  Neurological:     Mental Status: She is alert.         Last CBC Lab Results  Component Value Date   WBC 6.2 06/19/2023   HGB 13.1 06/19/2023   HCT 40.9 06/19/2023   MCV 97.4 06/19/2023   MCH 31.2 06/19/2023   RDW 15.8 (H) 06/19/2023   PLT 241 06/19/2023   Last metabolic panel Lab Results  Component Value Date   GLUCOSE 81 12/05/2023   NA 145 (H) 12/05/2023   K 4.1 12/05/2023   CL 105 12/05/2023   CO2 24 12/05/2023   BUN 14 12/05/2023   CREATININE 0.85 12/05/2023   EGFR 76 12/05/2023   CALCIUM 9.5 12/05/2023   PROT 5.5 (L) 05/21/2008   ALBUMIN 3.3 (L) 05/21/2008   BILITOT 0.2  (L) 05/21/2008   ALKPHOS 71 05/21/2008   AST 43 (H) 05/21/2008   ALT 42 (H) 05/21/2008   ANIONGAP 8 06/19/2023      Assessment & Plan:  Cellulitis of left lower extremity -     Comprehensive metabolic panel -     CBC with Differential/Platelet -     Amoxicillin-Pot Clavulanate; Take 1 tablet by mouth 2 (two) times daily with a meal for 7 days.  Dispense: 14 tablet; Refill: 0 -     metroNIDAZOLE; Take 1 tablet (500 mg total) by mouth 3 (three) times daily for 7 days.  Dispense: 21 tablet; Refill: 0  Dermatitis -     Clobetasol Propionate; Apply 1 Application topically 2 (two) times daily. Do not use >14 days  Dispense: 30 g; Refill: 0  Assessment and Plan    Skin Rash Severe pruritic rash on left forearm and leg, likely infectious in nature, possibly secondary to a cat bite. The cat is not up-to-date on vaccines. The rash has not responded to prednisone and doxycycline, but hydroxyzine has provided some relief  from itching. -Start Augmentin and Flagyl to cover potential bacteria from cat bite. -Apply topical steroid cream twice daily to affected areas. -Continue hydroxyzine as needed for itch relief. -Order CBC and CMP to assess for systemic infection and impact on blood cell counts and platelets. -Follow-up in one week to assess response to treatment. -Advise patient to seek emergency care if experiencing confusion, fever, nausea, or vomiting.  COPD and Emphysema Chronic conditions, no acute exacerbation reported. -Continue current management plan.        Return in about 1 week (around 02/25/2024).   Maretta Bees, PA

## 2024-02-20 ENCOUNTER — Ambulatory Visit (HOSPITAL_BASED_OUTPATIENT_CLINIC_OR_DEPARTMENT_OTHER): Payer: Medicare Other | Admitting: Nurse Practitioner

## 2024-02-25 ENCOUNTER — Ambulatory Visit (INDEPENDENT_AMBULATORY_CARE_PROVIDER_SITE_OTHER): Payer: Medicare Other | Admitting: Urgent Care

## 2024-02-25 ENCOUNTER — Encounter: Payer: Self-pay | Admitting: Urgent Care

## 2024-02-25 VITALS — BP 145/81 | HR 70 | Wt 145.0 lb

## 2024-02-25 DIAGNOSIS — L03116 Cellulitis of left lower limb: Secondary | ICD-10-CM

## 2024-02-25 DIAGNOSIS — Z23 Encounter for immunization: Secondary | ICD-10-CM | POA: Diagnosis not present

## 2024-02-25 MED ORDER — MUPIROCIN 2 % EX OINT: 1.0000 | TOPICAL_OINTMENT | Freq: Three times a day (TID) | CUTANEOUS | 0 refills | Status: DC

## 2024-02-25 MED ORDER — METRONIDAZOLE 500 MG PO TABS: 500.0000 mg | ORAL_TABLET | Freq: Two times a day (BID) | ORAL | 0 refills | Status: AC

## 2024-02-25 MED ORDER — AMOXICILLIN-POT CLAVULANATE 875-125 MG PO TABS: 1.0000 | ORAL_TABLET | Freq: Two times a day (BID) | ORAL | 0 refills | Status: AC

## 2024-02-25 NOTE — Progress Notes (Signed)
 Established Patient Office Visit  Subjective:  Patient ID: [name], female    DOB: 02/16/1961  Age: 63 y.o. MRN: [id number]  Chief Complaint  Patient presents with   Follow-up    2 week follow up on rash.    63yo female presents today with her husband for a one week follow up regarding cellulitis vs cat bite infection noted on her left leg. She took both the Augmentin and Flagyl, and states there has been remarkable improvement. Tolerated medications well. No ADRs. Using cream to L upper extremity with significant improvement, however there is still residual rash noted to L dorsal forearm just superior to wrist. Pt denies any acute complaints and feels that things have improved drastically.     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 (HCC) 06/27/2023   Primary osteoarthritis of right hip 02/08/2021   COPD with acute exacerbation (HCC) 02/02/2021   Abnormal screening CT of chest 02/09/2020   Healthcare maintenance 02/09/2020   COPD with chronic bronchitis and emphysema (HCC) 10/28/2019   Tobacco abuse 10/28/2019   Cervical spondylosis without myelopathy 08/04/2013   Intractable migraine without aura 03/13/2013   Past Medical History:  Diagnosis Date   Allergy    codeine,topamax,lyrica   Anxiety    Arthritis    Cancer of cervix (HCC)    Cataract    Cervical spondylosis without myelopathy 08/04/2013   COPD (chronic obstructive pulmonary disease) (HCC)    Coronary artery calcification    DDD (degenerative disc disease), lumbar    Depression    Dyspnea    on exertion   Emphysema of lung (HCC)    Fibromyalgia    GERD (gastroesophageal reflux disease)    History of radiation therapy    Right lung- 08/13/23-08/20/23- Dr. Antony Blackbird   Migraine without aura, with intractable migraine, so stated, without mention of status migrainosus 12/2020   Osteoporosis    Scoliosis    Past Surgical History:  Procedure  Laterality Date   ABDOMINAL HYSTERECTOMY     BRONCHIAL BIOPSY  06/19/2023   Procedure: BRONCHIAL BIOPSIES;  Surgeon: Leslye Peer, MD;  Location: [facility] ENDOSCOPY;  Service: Pulmonary;;   BRONCHIAL BRUSHINGS  06/19/2023   Procedure: BRONCHIAL BRUSHINGS;  Surgeon: Leslye Peer, MD;  Location: [facility] ENDOSCOPY;  Service: Pulmonary;;   BRONCHIAL NEEDLE ASPIRATION BIOPSY  06/19/2023   Procedure: BRONCHIAL NEEDLE ASPIRATION BIOPSIES;  Surgeon: Leslye Peer, MD;  Location: [facility] ENDOSCOPY;  Service: Pulmonary;;   BRONCHIAL WASHINGS  06/19/2023   Procedure: BRONCHIAL WASHINGS;  Surgeon: Leslye Peer, MD;  Location: [facility] ENDOS[facility];  Service: Pulmonary;;   CARPAL TUNNEL RELEASE Left    CESAREAN SECTION     x 1   COLONOSCOPY     FIDUCIAL MARKER PLACEMENT  06/19/2023   Procedure: FIDUCIAL MARKER PLACEMENT;  Surgeon: Leslye Peer, MD;  Location: [facility] ENDOS[facility];  Service: Pulmonary;;   JOINT REPLACEMENT     right hip   left ring finger surgery     w/ pins   LUMBAR LAMINECTOMY     MULTIPLE TOOTH EXTRACTIONS     has full dentures   NECK SURGERY     x 2   SPINE SURGERY     front and back neck   TOTAL HIP ARTHROPLASTY Right 02/08/2021   Procedure: RIGHT TOTAL HIP ARTHROPLASTY ANTERIOR APPROACH;  Surgeon: Marcene Corning, MD;  Location: [facility];  Service: Orthopedics;  Laterality: Right;  TUBAL LIGATION     ULNAR NERVE TRANSPOSITION Left    Social History   Tobacco Use   Smoking status: Former    Current packs/day: 0.00    Average packs/day: 1 pack/day for 49.0 years (49.0 ttl pk-yrs)    Types: Cigarettes    Quit date: 08/09/2023    Years since quitting: 0.5   Smokeless tobacco: Never  Vaping Use   Vaping status: Never Used  Substance Use Topics   Alcohol use: Yes    Alcohol/week: 9.0 standard drinks of alcohol    Types: 2 Cans of beer, 7 Standard drinks or equivalent per week    Comment: beer/mixed drinks   Drug use: No      ROS: as noted in HPI  Objective:     BP (!) 145/81    Pulse 70   Wt 145 lb (65.8 kg)   SpO2 98%   BMI 26.52 kg/m  BP Readings from Last 3 Encounters:  02/25/24 (!) 145/81  02/18/24 136/81  02/04/24 (!) 174/87   Wt Readings from Last 3 Encounters:  02/25/24 145 lb (65.8 kg)  02/18/24 143 lb 12.8 oz (65.2 kg)  02/04/24 139 lb 8.8 oz (63.3 kg)      Physical Exam Vitals and nursing note reviewed. Exam conducted with a chaperone present.  Constitutional:      General: She is not in acute distress.    Appearance: Normal appearance. She is normal weight. She is not ill-appearing, toxic-appearing or diaphoretic.  HENT:     Head: Normocephalic and atraumatic.  Cardiovascular:     Rate and Rhythm: Normal rate.     Pulses: Normal pulses.  Pulmonary:     Effort: Pulmonary effort is normal. No respiratory distress.  Musculoskeletal:     Right lower leg: No edema.     Left lower leg: No edema (mild edema surrounding erythematous skin).  Skin:    General: Skin is warm.     Coloration: Skin is not jaundiced.     Findings: Erythema and rash present.     Comments: Significant improvement to rash with nearly 95% erythema resolved. No warmth. Slightly discoloration noted. Excoriations resolved. Several open crusted areas remain just above the ankle  Neurological:     Mental Status: She is alert.      No results found for any visits on 02/25/24.  Last CBC Lab Results  Component Value Date   WBC 5.9 02/18/2024   HGB 12.4 02/18/2024   HCT 37.4 02/18/2024   MCV 93.8 02/18/2024   MCH 31.2 06/19/2023   RDW 15.4 02/18/2024   PLT 268.0 02/18/2024   Last metabolic panel Lab Results  Component Value Date   GLUCOSE 89 02/18/2024   NA 144 02/18/2024   K 3.8 02/18/2024   CL 108 02/18/2024   CO2 28 02/18/2024   BUN 12 02/18/2024   CREATININE 0.73 02/18/2024   GFR 86.90 02/18/2024   CALCIUM 9.3 02/18/2024   PROT 6.5 02/18/2024   ALBUMIN 4.3 02/18/2024   BILITOT 0.4 02/18/2024   ALKPHOS 99 02/18/2024   AST 15 02/18/2024   ALT 15  02/18/2024   ANIONGAP 8 06/19/2023      The ASCVD Risk score (Arnett DK, et al., 2019) failed to calculate for the following reasons:   Cannot find a previous HDL lab   Cannot find a previous total cholesterol lab  Assessment & Plan:  Cellulitis of left lower extremity -     metroNIDAZOLE; Take 1 tablet (500 mg total)  by mouth 2 (two) times daily for 5 days.  Dispense: 10 tablet; Refill: 0 -     Amoxicillin-Pot Clavulanate; Take 1 tablet by mouth 2 (two) times daily with a meal for 5 days.  Dispense: 10 tablet; Refill: 0 -     Mupirocin; Apply 1 Application topically 3 (three) times daily.  Dispense: 22 g; Refill: 0  Need for diphtheria-tetanus-pertussis (Tdap) vaccine -     Tdap vaccine greater than or equal to 7yo IM  Will extend antibiotics for another 5 days as I have concern for possible mild residual infection. Significant improvement noted. Will add topical mupirocin to open scabbed areas of lower extremity.  Updated Tdap today, although pt understands this is for chronic health maintenance and will not assist with any possible issues from bite as it was given roughly 1 mo post- puncture wound.   Return in about 4 weeks (around 03/24/2024) for Annual Physical.   Maretta Bees, PA

## 2024-02-25 NOTE — Patient Instructions (Addendum)
 Extend the antibiotic therapy for another 5 days. (Both the Augmentin and the flagyl). Take twice daily with food (usually breakfast and dinner).  Apply the mupirocin ointment to the open areas of your right leg 2-3x/ day for the next week.  We updated your Tdap vaccine today.   Please schedule an annual physical / pap smear with fasting labs within the next month. Please monitor your blood pressure at home and bring in a blood pressure log to your next office visit.

## 2024-03-10 ENCOUNTER — Telehealth: Payer: Self-pay

## 2024-03-10 ENCOUNTER — Telehealth: Payer: Self-pay | Admitting: *Deleted

## 2024-03-10 NOTE — Telephone Encounter (Signed)
 Patient called in to reports right side chest pain  and pain right under breast. Patient states she thinks its related to her lung cancer. Patient continues to have cough and shortness of breath.Patient completed 3 Fx of radiation to right lung on 0826/24.  Patient asking if CT scan can be moved up. Please advise.

## 2024-03-10 NOTE — Telephone Encounter (Signed)
 CALLED PATIENT TO INFORM OF CT FOR 03-11-24- ARRIVAL TIME- 10:15 AM @ [facility], NO RESTRICTIONS TO SCAN, PATIENT TO RECEIVE RESULTS FROM DR. KINARD ON 03-17-24 @ 3:30 PM, SPOKE WITH PATIENT AND SHE IS AWARE OF THESE APPTS. AND THE INSTRUCTIONS

## 2024-03-11 ENCOUNTER — Ambulatory Visit (HOSPITAL_COMMUNITY)
Admission: RE | Admit: 2024-03-11 | Discharge: 2024-03-11 | Disposition: A | Discharge status: Home / Self Care | Source: Ambulatory Visit | Attending: Radiology | Admitting: Radiology

## 2024-03-11 DIAGNOSIS — C3411 Malignant neoplasm of upper lobe, right bronchus or lung: Secondary | ICD-10-CM | POA: Insufficient documentation

## 2024-03-17 ENCOUNTER — Encounter: Payer: Self-pay | Admitting: Radiation Oncology

## 2024-03-17 ENCOUNTER — Ambulatory Visit
Admission: RE | Admit: 2024-03-17 | Discharge: 2024-03-17 | Disposition: A | Discharge status: Home / Self Care | Source: Ambulatory Visit | Attending: Radiation Oncology | Admitting: Radiation Oncology

## 2024-03-17 VITALS — BP 154/77 | HR 71 | Temp 97.3°F | Resp 18 | Ht 62.0 in | Wt 144.5 lb

## 2024-03-17 DIAGNOSIS — C3411 Malignant neoplasm of upper lobe, right bronchus or lung: Secondary | ICD-10-CM | POA: Diagnosis present

## 2024-03-17 DIAGNOSIS — M8588 Other specified disorders of bone density and structure, other site: Secondary | ICD-10-CM | POA: Diagnosis not present

## 2024-03-17 DIAGNOSIS — J439 Emphysema, unspecified: Secondary | ICD-10-CM | POA: Diagnosis not present

## 2024-03-17 DIAGNOSIS — I700 Atherosclerosis of aorta: Secondary | ICD-10-CM | POA: Diagnosis not present

## 2024-03-17 DIAGNOSIS — M47814 Spondylosis without myelopathy or radiculopathy, thoracic region: Secondary | ICD-10-CM | POA: Insufficient documentation

## 2024-03-17 DIAGNOSIS — I2510 Atherosclerotic heart disease of native coronary artery without angina pectoris: Secondary | ICD-10-CM | POA: Diagnosis not present

## 2024-03-17 DIAGNOSIS — Z79899 Other long term (current) drug therapy: Secondary | ICD-10-CM | POA: Insufficient documentation

## 2024-03-17 DIAGNOSIS — Z923 Personal history of irradiation: Secondary | ICD-10-CM | POA: Insufficient documentation

## 2024-03-17 DIAGNOSIS — Z7982 Long term (current) use of aspirin: Secondary | ICD-10-CM | POA: Diagnosis not present

## 2024-03-17 DIAGNOSIS — Z791 Long term (current) use of non-steroidal anti-inflammatories (NSAID): Secondary | ICD-10-CM | POA: Diagnosis not present

## 2024-03-17 DIAGNOSIS — N644 Mastodynia: Secondary | ICD-10-CM

## 2024-03-17 NOTE — Progress Notes (Signed)
 Radiation Oncology         [phone] ________________________________  Name: [name] MRN: [id number]  Date: 03/17/2024  DOB: 1961/03/31  Follow-Up Visit Note  CC: Maretta Bees, PA  Leslye Peer, MD    ICD-10-CM   1. Malignant neoplasm of right upper lobe of lung (HCC)  C34.11 CT CHEST WO CONTRAST    2. 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 She was last seen here for follow-up on 12/24/23.  Her most recent chest CT on 03/11/24 demonstrates: interval stability of the 8 mm x 6 mm RUL nodule (located posterior to a fiducial marker) when compared to imaging from 12/14/23, stability of the 4 mm nodule located posteroinferior to the primary RUL nodule, stability of the 3 mm LLL nodule, and a slight decrease in size of the nodule located superior and medial to the primary RUL nodule, measuring 3 mm (previously 5 mm).  No new or progressive findings were demonstrated overall.           Other pertinent imaging performed in the interval includes a bilateral screening mammogram on 02/15/24 which showed no evidence of malignancy in either breast.   Of note: She had an ED encounter last month on 02/10 due to a persistent rash on her bilateral arms and left leg with associated erythema. She was treated with steroid burst as well as doxycycline to cover possible cellulitis. Her symptoms however persisted after being discharged and her PCP subsequently started her on Augmentin and Flagyl with  significant improvement noted during her most recent visit with her PCP on 03/03.   Patient denies any worsening shortness of breath, cough, chest pain or any other changes to her breathing since she was last seen by [place]. She notes some breast pain ***                          Allergies:  is allergic to codeine, topamax [topiramate], and lyrica [pregabalin].  Meds: Current Outpatient Medications  Medication Sig Dispense Refill   AIMOVIG 140 MG/ML SOAJ      albuterol (VENTOLIN HFA) 108 (90 Base) MCG/ACT inhaler INHALE 1 TO 2 PUFFS EVERY 6 HOURS AS NEEDED FOR WHEEZING OR SHORTNESS OF BREATH. 54 g 3   aspirin EC 81 MG tablet Take 81 mg by mouth daily. Swallow whole.     atorvastatin (LIPITOR) 20 MG tablet Take 1 tablet (20 mg total) by mouth daily. 90 tablet 3   Biotin 1000 MCG CHEW Chew 1,000 mcg by mouth daily.     Budeson-Glycopyrrol-Formoterol (BREZTRI AEROSPHERE) 160-9-4.8 MCG/ACT AERO Inhale 2 puffs into the lungs 2 (two) times daily. 10.7 g 6   butalbital-acetaminophen-caffeine (FIORICET) 50-325-40 MG tablet Take 1 tablet by mouth 2 (two) times daily as needed for headache or migraine.     clobetasol ointment (TEMOVATE) 0.05 % Apply 1 Application topically 2 (two) times daily. Do not use >14 days 30 g 0  cyanocobalamin (VITAMIN B12) 1000 MCG tablet Take 2,000 mcg by mouth daily.     dextromethorphan-guaiFENesin (MUCINEX DM) 30-600 MG 12hr tablet Take 1 tablet by mouth 2 (two) times daily as needed for cough.     diclofenac (VOLTAREN) 75 MG EC tablet Take 75 mg by mouth 2 (two) times daily.     Erenumab-aooe (AIMOVIG, 140 MG DOSE, [place]) Inject 140 mg into the skin every 30 (thirty) days.     famotidine (PEPCID) 20 MG tablet Take 20 mg by mouth 2 (two) times daily as needed for heartburn or indigestion.     hydrOXYzine (VISTARIL) 25 MG capsule Take 1 capsule (25 mg total) by mouth every 8 (eight) hours as needed. 30 capsule 0   lamoTRIgine (LAMICTAL) 100 MG tablet Take 100 mg by mouth 2 (two)  times daily.      mirtazapine (REMERON) 45 MG tablet Take 1 tablet (45 mg total) by mouth at bedtime.     mupirocin ointment (BACTROBAN) 2 % Apply 1 Application topically 3 (three) times daily. 22 g 0   ondansetron (ZOFRAN) 4 MG tablet Take 4 mg by mouth every 8 (eight) hours as needed for nausea or vomiting.     Oxycodone HCl 10 MG TABS Take 10 mg by mouth every 6 (six) hours.     Respiratory Therapy Supplies (FLUTTER) DEVI Use as directed. 1 each 0   SUMAtriptan (IMITREX) 100 MG tablet TAKE 1 TABLET (100 MG TOTAL) BY MOUTH AS NEEDED FOR MIGRAINE. (Patient taking differently: Take 100 mg by mouth every 2 (two) hours as needed for migraine.) 9 tablet 0   tiZANidine (ZANAFLEX) 4 MG tablet Take 4 mg by mouth 3 (three) times daily.     No current facility-administered medications for this encounter.    Physical Findings: The patient is in no acute distress. Patient is alert and oriented.  height is 5\' 2"  (1.575 m) and weight is 144 lb 8 oz (65.5 kg). Her temporal temperature is 97.3 F (36.3 C) (abnormal). Her blood pressure is 154/77 (abnormal) and her pulse is 71. Her respiration is 18 and oxygen saturation is 99%. .  No significant changes. Lungs are clear to auscultation bilaterally. Heart has regular rate and rhythm. No palpable cervical, supraclavicular, or axillary adenopathy. Abdomen soft, non-tender, normal bowel sounds.   Lab Findings: Lab Results  Component Value Date   WBC 5.9 02/18/2024   HGB 12.4 02/18/2024   HCT 37.4 02/18/2024   MCV 93.8 02/18/2024   PLT 268.0 02/18/2024    Radiographic Findings: CT Chest Wo Contrast Result Date: 03/16/2024 CLINICAL DATA:  Right upper lobe non-small cell lung cancer follow-up. EXAM: CT CHEST WITHOUT CONTRAST TECHNIQUE: Multidetector CT imaging of the chest was performed following the standard protocol without IV contrast. RADIATION DOSE REDUCTION: This exam was performed according to the departmental dose-optimization program which includes  automated exposure control, adjustment of the mA and/or kV according to patient size and/or use of iterative reconstruction technique. COMPARISON:  Chest CTs without contrast dated 12/22/2023 and 06/08/2023. FINDINGS: Cardiovascular: There are trace three-vessel coronary artery calcifications. The cardiac size is normal. There is no pericardial effusion. Scattered calcified plaque in the aorta and great vessels is seen without evidence of aneurysm, with mild descending tortuosity. The pulmonary arteries and veins are normal caliber. Mediastinum/Nodes: No enlarged mediastinal or axillary lymph nodes. Thyroid gland, trachea, and esophagus demonstrate no significant findings. Lungs/Pleura: The lungs are mildly emphysematous with centrilobular changes predominating, mild biapical scarring. The previously hypermetabolic nodule in the  right upper lobe again noted posterior to a fiducial marker, again measures 8 x 6 mm 05:58, stable since the last CT and was 1.2 x 0.7 cm on 06/08/2023. Adjacent linear atelectasis and scarring change is again noted. Posteroinferior to the above nodule, there is a stable 4 mm nodule in 5:60 which was new as of the last CT. Superior and medial to the nodule, on 5:54 there is a 3 mm nodule, also new on the last CT, which previously measured 5 mm. In the left lower lobe, again noted is a stable 3 mm nodule lung 5:102. There is mild diffuse bronchial thickening. There are no further nodules. No consolidation or effusion. Upper Abdomen: No acute noncontrast CT abnormality. Abdominal aortic atherosclerosis. Musculoskeletal: There is partially visible anterior fusion plating extending from the lower cervical spine ending at T1, with adjacent-segment T1-2 disc collapse. There is osteopenia and degenerative change of thoracic spine. No regional bone metastasis is seen. IMPRESSION: 1. Four 8 x 6 mm right upper lobe nodule posterior to a fiducial marker, previously 1.2 x 0.7 cm on 06/08/2023, stable  since 12/14/2023. 2. Stable 4 mm nodule posteroinferior to the above nodule (new as of 12/14/2023 ), and 3 mm nodule superior and medial to the nodule which was previously 5 mm and also new on the last CT. 3. Stable 3 mm nodule in the left lower lobe. 4. Emphysema and bronchitis. 5. Aortic and coronary artery atherosclerosis. 6. Osteopenia and degenerative change. Electronically Signed   By: Almira Bar M.D.   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 We personally reviewed her most recent imaging. CT scan is stable with no evidence of disease progression or recurrence. Per NCCN guidelines, we will follow up with a chest CT in 6 months. Routine office visit 1-2 days after scan to review results. Patient was encouraged to call with any questions or concerns in the meantime.   ***   *** minutes of total time was spent for this patient encounter, including preparation, face-to-face counseling with the patient and coordination of care, physical exam, and documentation of the encounter. ____________________________________   Bryan Lemma, PA-C  This document serves as a record of services personally performed by Bryan Lemma, PA-C. It was created on his behalf by Neena Rhymes, a trained medical scribe. The creation of this record is based on the scribe's personal observations and the provider's statements to them. This document has been checked and approved by the attending provider.

## 2024-03-17 NOTE — Progress Notes (Signed)
 [name] is here today for follow up post radiation to the lung.  Lung Side: Right, last radiation treatment was 08/20/2023.  Does the patient complain of any of the following: Pain: Yes, she reports some pain 3/10 in breast area and under armpit. Shortness of breath w/wo exertion: Denies Cough: Occasional Hemoptysis: Denies Pain with swallowing: Denies Swallowing/choking concerns: Denies Appetite: Good  Energy Level: Fair Post radiation skin Changes: Denies     BP (!) 154/77 (BP Location: Left Arm, Patient Position: Sitting)   Pulse 71   Temp (!) 97.3 F (36.3 C) (Temporal)   Resp 18   Ht 5\' 2"  (1.575 m)   Wt 144 lb 8 oz (65.5 kg)   SpO2 99%   BMI 26.43 kg/m

## 2024-03-18 ENCOUNTER — Other Ambulatory Visit: Payer: Self-pay | Admitting: Radiology

## 2024-03-18 ENCOUNTER — Telehealth: Payer: Self-pay | Admitting: *Deleted

## 2024-03-18 DIAGNOSIS — N644 Mastodynia: Secondary | ICD-10-CM

## 2024-03-18 NOTE — Telephone Encounter (Signed)
 CALLED PATIENT TO INFORM OF MAMMOGRAM AND ULTRASOUND SCHEDULED FOR 04-10-24- ARRIVAL TIME- 1 PM @ [facility], SPOKE WITH PATIENT AND SHE IS AWARE OF THIS APPT. AND THE INSTRUCTIONS

## 2024-03-25 ENCOUNTER — Other Ambulatory Visit: Payer: Self-pay

## 2024-03-25 ENCOUNTER — Encounter: Admitting: Urgent Care

## 2024-03-25 DIAGNOSIS — L309 Dermatitis, unspecified: Secondary | ICD-10-CM

## 2024-03-27 ENCOUNTER — Ambulatory Visit: Payer: Self-pay | Admitting: Radiation Oncology

## 2024-03-28 DIAGNOSIS — H11442 Conjunctival cysts, left eye: Secondary | ICD-10-CM | POA: Diagnosis not present

## 2024-03-28 DIAGNOSIS — H02821 Cysts of right upper eyelid: Secondary | ICD-10-CM | POA: Diagnosis not present

## 2024-03-28 DIAGNOSIS — H25813 Combined forms of age-related cataract, bilateral: Secondary | ICD-10-CM | POA: Diagnosis not present

## 2024-03-31 DIAGNOSIS — G894 Chronic pain syndrome: Secondary | ICD-10-CM | POA: Diagnosis not present

## 2024-03-31 DIAGNOSIS — M545 Low back pain, unspecified: Secondary | ICD-10-CM | POA: Diagnosis not present

## 2024-03-31 DIAGNOSIS — M5481 Occipital neuralgia: Secondary | ICD-10-CM | POA: Diagnosis not present

## 2024-04-01 ENCOUNTER — Encounter: Payer: Self-pay | Admitting: Urgent Care

## 2024-04-01 ENCOUNTER — Ambulatory Visit (INDEPENDENT_AMBULATORY_CARE_PROVIDER_SITE_OTHER): Admitting: Urgent Care

## 2024-04-01 VITALS — BP 159/86 | HR 82 | Ht 62.0 in | Wt 144.0 lb

## 2024-04-01 DIAGNOSIS — Z114 Encounter for screening for human immunodeficiency virus [HIV]: Secondary | ICD-10-CM

## 2024-04-01 DIAGNOSIS — G43711 Chronic migraine without aura, intractable, with status migrainosus: Secondary | ICD-10-CM

## 2024-04-01 DIAGNOSIS — Z1159 Encounter for screening for other viral diseases: Secondary | ICD-10-CM

## 2024-04-01 DIAGNOSIS — Z0000 Encounter for general adult medical examination without abnormal findings: Secondary | ICD-10-CM

## 2024-04-01 DIAGNOSIS — Z1211 Encounter for screening for malignant neoplasm of colon: Secondary | ICD-10-CM | POA: Diagnosis not present

## 2024-04-01 DIAGNOSIS — Z131 Encounter for screening for diabetes mellitus: Secondary | ICD-10-CM

## 2024-04-01 MED ORDER — RIZATRIPTAN BENZOATE 10 MG PO TBDP: ORAL_TABLET | ORAL | 5 refills | Status: DC

## 2024-04-01 NOTE — Progress Notes (Signed)
 Annual Wellness Visit     Patient: [name], Female    DOB: [birth year], 63 y.o.   MRN: [id number]  Subjective  Chief Complaint  Patient presents with   Annual Exam    CPE pt did eat half a banana. Pt declined pap today.    [name] is a 63 y.o. female who presents today for her Annual Wellness Visit. She reports consuming a general diet. Home exercise routine includes stretching and pedal bike. She generally feels "moderate" due to concern with her heart and recurrent headaches. She is concerned with her weight gain. She reports sleeping poorly. She does not have additional problems to discuss today.   HPI  Discussed the use of AI scribe software for clinical note transcription with the patient, who gave verbal consent to proceed.  History of Present Illness   [name] is a 63 year old female who presents for an annual physical exam.  During the visit, she experienced a migraine, which prevented her from undergoing a Pap smear. Her last Pap smear was likely in 2020.  She has a history of migraines and is currently on Aimovig, Fioricet, Nurtec and sumatriptan. Aimovig is administered as a monthly injection, while sumatriptan and Nurtec are used as needed for acute episodes. She takes Butalbital for tension headaches but is unsure of its effectiveness. She took it this morning prior to her visit, without improvement. Her migraines occur without aura, and there is a family history of migraines affecting both parents.  She reports a significant weight gain of approximately 25 pounds over the past six months, despite limited food intake and engaging in exercises like leg lifts and using a pedal bike at home. She is concerned about her weight, noting it is the highest since her pregnancy.  She describes poor sleep quality, with difficulty staying asleep. She takes Benadryl and Remeron 45 mg at night for sleep. There is a family history of sleep issues, affecting her sister and  son.  She has a history of lung cancer treated with radiation, resulting in scar tissue. She follows up with pulmonology and radiation oncology. Additionally, she has a history of plaque buildup in her arteries and follows with cardiology.  She mentions a polyp found on her eye and a cyst removal, with no significant vision issues. She does not have natural teeth and does not see a dentist. She occasionally smokes CBD for relaxation.      Vision:Within last year and Dental: No regular dental care  pt has dentures with no natural teeth.   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 (HCC) 06/27/2023   Primary osteoarthritis of right hip 02/08/2021   COPD with acute exacerbation (HCC) 02/02/2021   Abnormal screening CT of chest 02/09/2020   Healthcare maintenance 02/09/2020   COPD with chronic bronchitis and emphysema (HCC) 10/28/2019   Tobacco abuse 10/28/2019   Cervical spondylosis without myelopathy 08/04/2013   Intractable migraine without aura 03/13/2013   Past Medical History:  Diagnosis Date   Allergy    codeine,topamax,lyrica   Anxiety    Arthritis    Cancer of cervix (HCC)    Cataract    Cervical spondylosis without myelopathy 08/04/2013   COPD (chronic obstructive pulmonary disease) (HCC)    Coronary artery calcification    DDD (degenerative disc disease), lumbar    Depression    Dyspnea    on exertion   Emphysema  of lung (HCC)    Fibromyalgia    GERD (gastroesophageal reflux disease)    History of radiation therapy    Right lung- 08/13/23-08/20/23- Dr. Antony Blackbird   Migraine without aura, with intractable migraine, so stated, without mention of status migrainosus 12/2020   Osteoporosis    Scoliosis    Past Surgical History:  Procedure Laterality Date   ABDOMINAL HYSTERECTOMY     BRONCHIAL BIOPSY  06/19/2023   Procedure: BRONCHIAL BIOPSIES;  Surgeon: Leslye Peer, MD;  Location: [facility] END[facility];   Service: Pulmonary;;   BRONCHIAL BRUSHINGS  06/19/2023   Procedure: BRONCHIAL BRUSHINGS;  Surgeon: Leslye Peer, MD;  Location: [facility] ENDOSCOPY;  Service: Pulmonary;;   BRONCHIAL NEEDLE ASPIRATION BIOPSY  06/19/2023   Procedure: BRONCHIAL NEEDLE ASPIRATION BIOPSIES;  Surgeon: Leslye Peer, MD;  Location: [facility] ENDOSCOPY;  Service: Pulmonary;;   BRONCHIAL WASHINGS  06/19/2023   Procedure: BRONCHIAL WASHINGS;  Surgeon: Leslye Peer, MD;  Location: [facility] ENDOSCOPY;  Service: Pulmonary;;   CARPAL TUNNEL RELEASE Left    CESAREAN SECTION     x 1   COLONOSCOPY     FIDUCIAL MARKER PLACEMENT  06/19/2023   Procedure: FIDUCIAL MARKER PLACEMENT;  Surgeon: Leslye Peer, MD;  Location: [facility] ENDOSCOPY;  Service: Pulmonary;;   JOINT REPLACEMENT     right hip   left ring finger surgery     w/ pins   LUMBAR LAMINECTOMY     MULTIPLE TOOTH EXTRACTIONS     has full dentures   NECK SURGERY     x 2   SPINE SURGERY     front and back neck   TOTAL HIP ARTHROPLASTY Right 02/08/2021   Procedure: RIGHT TOTAL HIP ARTHROPLASTY ANTERIOR APPROACH;  Surgeon: Marcene Corning, MD;  Location: [facility] ORS;  Service: Orthopedics;  Laterality: Right;   TUBAL LIGATION     ULNAR NERVE TRANSPOSITION Left    Social History   Socioeconomic History   Marital status: Married    Spouse name: Fredrik Cove    Number of children: 1   Years of education: 9   Highest education level: 12th grade  Occupational History    Comment: Does not work  Tobacco Use   Smoking status: Former    Current packs/day: 0.00    Average packs/day: 1 pack/day for 49.0 years (49.0 ttl pk-yrs)    Types: Cigarettes    Quit date: 08/09/2023    Years since quitting: 0.6   Smokeless tobacco: Never  Vaping Use   Vaping status: Never Used  Substance and Sexual Activity   Alcohol use: Yes    Alcohol/week: 9.0 standard drinks of alcohol    Types: 2 Cans of beer, 7 Standard drinks or equivalent per week    Comment: beer/mixed drinks   Drug use: No   Sexual  activity: Not Currently    Birth control/protection: Post-menopausal    Comment: Hysterectomy  Other Topics Concern   Not on file  Social History Narrative   Patient lives at home Fredrik Cove her husband.    Patient has 1 child.    Patient has 9 th grade education.    Patient is currently not currently working.             Social Drivers of Health   Financial Resource Strain: Low Risk  (02/17/2024)   Overall Financial Resource Strain (CARDIA)    Difficulty of Paying Living Expenses: Not very hard  Food Insecurity: No Food Insecurity (02/17/2024)   Hunger Vital Sign  Worried About Programme researcher, broadcasting/film/video in the Last Year: Never true    Ran Out of Food in the Last Year: Never true  Transportation Needs: No Transportation Needs (02/17/2024)   PRAPARE - Administrator, Civil Service (Medical): No    Lack of Transportation (Non-Medical): No  Physical Activity: Inactive (02/17/2024)   Exercise Vital Sign    Days of Exercise per Week: 0 days    Minutes of Exercise per Session: 0 min  Stress: No Stress Concern Present (02/17/2024)   [employer] of Occupational Health - Occupational Stress Questionnaire    Feeling of Stress : Only a little  Social Connections: Unknown (02/17/2024)   Social Connection and Isolation Panel [NHANES]    Frequency of Communication with Friends and Family: More than three times a week    Frequency of Social Gatherings with Friends and Family: Patient declined    Attends Religious Services: Patient declined    Database administrator or Organizations: No    Attends Engineer, structural: Not on file    Marital Status: Married  Catering manager Violence: Not At Risk (06/26/2023)   Humiliation, Afraid, Rape, and Kick questionnaire    Fear of Current or Ex-Partner: No    Emotionally Abused: No    Physically Abused: No    Sexually Abused: No    Medications: Outpatient Medications Prior to Visit  Medication Sig   AIMOVIG 140 MG/ML SOAJ     albuterol (VENTOLIN HFA) 108 (90 Base) MCG/ACT inhaler INHALE 1 TO 2 PUFFS EVERY 6 HOURS AS NEEDED FOR WHEEZING OR SHORTNESS OF BREATH.   aspirin EC 81 MG tablet Take 81 mg by mouth daily. Swallow whole.   atorvastatin (LIPITOR) 20 MG tablet Take 1 tablet (20 mg total) by mouth daily.   Biotin 1000 MCG CHEW Chew 1,000 mcg by mouth daily.   Budeson-Glycopyrrol-Formoterol (BREZTRI AEROSPHERE) 160-9-4.8 MCG/ACT AERO Inhale 2 puffs into the lungs 2 (two) times daily.   butalbital-acetaminophen-caffeine (FIORICET) 50-325-40 MG tablet Take 1 tablet by mouth 2 (two) times daily as needed for headache or migraine.   clobetasol ointment (TEMOVATE) 0.05 % Apply 1 Application topically 2 (two) times daily. Do not use >14 days   cyanocobalamin (VITAMIN B12) 1000 MCG tablet Take 2,000 mcg by mouth daily.   diclofenac (VOLTAREN) 75 MG EC tablet Take 75 mg by mouth 2 (two) times daily.   Erenumab-aooe (AIMOVIG, 140 MG DOSE, [place]) Inject 140 mg into the skin every 30 (thirty) days.   famotidine (PEPCID) 20 MG tablet Take 20 mg by mouth 2 (two) times daily as needed for heartburn or indigestion.   lamoTRIgine (LAMICTAL) 100 MG tablet Take 100 mg by mouth 2 (two) times daily.    mirtazapine (REMERON) 45 MG tablet Take 1 tablet (45 mg total) by mouth at bedtime.   mupirocin ointment (BACTROBAN) 2 % Apply 1 Application topically 3 (three) times daily.   ondansetron (ZOFRAN) 4 MG tablet Take 4 mg by mouth every 8 (eight) hours as needed for nausea or vomiting.   Oxycodone HCl 10 MG TABS Take 10 mg by mouth every 6 (six) hours.   Respiratory Therapy Supplies (FLUTTER) DEVI Use as directed.   tiZANidine (ZANAFLEX) 4 MG tablet Take 4 mg by mouth 3 (three) times daily.   [DISCONTINUED] SUMAtriptan (IMITREX) 100 MG tablet TAKE 1 TABLET (100 MG TOTAL) BY MOUTH AS NEEDED FOR MIGRAINE.   [DISCONTINUED] dextromethorphan-guaiFENesin (MUCINEX DM) 30-600 MG 12hr tablet Take 1 tablet by mouth 2 (  two) times daily as needed for cough.    [DISCONTINUED] hydrOXYzine (VISTARIL) 25 MG capsule Take 1 capsule (25 mg total) by mouth every 8 (eight) hours as needed.   No facility-administered medications prior to visit.    Allergies  Allergen Reactions   Codeine Nausea Only   Topamax [Topiramate] Hives   Lyrica [Pregabalin] Swelling    Patient Care Team: Maretta Bees, [place] as PCP - General (Physician Assistant)  ROS Complete 12 point ROS performed with all pertinent positives listed in HPI     Objective  BP (!) 159/86   Pulse 82   Ht 5\' 2"  (1.575 m)   Wt 144 lb (65.3 kg)   SpO2 96%   BMI 26.34 kg/m  BP Readings from Last 3 Encounters:  04/01/24 (!) 159/86  03/17/24 (!) 154/77  02/25/24 (!) 145/81   Wt Readings from Last 3 Encounters:  04/01/24 144 lb (65.3 kg)  03/17/24 144 lb 8 oz (65.5 kg)  02/25/24 145 lb (65.8 kg)      Physical Exam Vitals and nursing note reviewed.  Constitutional:      General: She is not in acute distress.    Appearance: Normal appearance. She is not ill-appearing, toxic-appearing or diaphoretic.  HENT:     Head: Normocephalic and atraumatic.     Right Ear: Tympanic membrane, ear canal and external ear normal. There is no impacted cerumen.     Left Ear: Tympanic membrane, ear canal and external ear normal. There is no impacted cerumen.     Nose: Nose normal.     Mouth/Throat:     Mouth: Mucous membranes are moist.     Pharynx: Oropharynx is clear. No oropharyngeal exudate or posterior oropharyngeal erythema.  Eyes:     General: No scleral icterus.       Right eye: No discharge.        Left eye: No discharge.     Extraocular Movements: Extraocular movements intact.     Pupils: Pupils are equal, round, and reactive to light.  Neck:     Thyroid: No thyroid mass, thyromegaly or thyroid tenderness.  Cardiovascular:     Rate and Rhythm: Normal rate and regular rhythm.     Pulses: Normal pulses.     Heart sounds: No murmur heard. Pulmonary:     Effort: Pulmonary effort is  normal. No respiratory distress.     Breath sounds: Normal breath sounds. No stridor. No wheezing or rhonchi.  Abdominal:     General: Abdomen is flat. Bowel sounds are normal. There is no distension.     Palpations: Abdomen is soft. There is no mass.     Tenderness: There is no abdominal tenderness. There is no guarding.  Musculoskeletal:     Cervical back: Normal range of motion and neck supple. No rigidity or tenderness.     Right lower leg: No edema.     Left lower leg: No edema.  Lymphadenopathy:     Cervical: No cervical adenopathy.  Skin:    General: Skin is warm and dry.     Coloration: Skin is not jaundiced.     Findings: No bruising, erythema or rash.  Neurological:     General: No focal deficit present.     Mental Status: She is alert and oriented to person, place, and time.     Sensory: No sensory deficit.     Motor: No weakness.  Psychiatric:        Mood and Affect: Mood normal.  Behavior: Behavior normal.       Most recent functional status assessment:     No data to display         Most recent fall risk assessment:    04/01/2024    2:08 PM  Fall Risk   Falls in the past year? 0  Number falls in past yr: 0  Injury with Fall? 0  Risk for fall due to : No Fall Risks  Follow up Falls evaluation completed    Most recent depression screenings:    04/01/2024    2:08 PM 02/25/2024    2:36 PM  PHQ 2/9 Scores  PHQ - 2 Score 0 0  PHQ- 9 Score 4 4   Most recent cognitive screening:     No data to display         Most recent Audit-C alcohol use screening    02/17/2024    9:09 PM  Alcohol Use Disorder Test (AUDIT)  1. How often do you have a drink containing alcohol? 2  2. How many drinks containing alcohol do you have on a typical day when you are drinking? 0  3. How often do you have six or more drinks on one occasion? 1  AUDIT-C Score 3   4. How often during the last year have you found that you were not able to stop drinking once you had  started? 0  5. How often during the last year have you failed to do what was normally expected from you because of drinking? 0  6. How often during the last year have you needed a first drink in the morning to get yourself going after a heavy drinking session? 0  7. How often during the last year have you had a feeling of guilt of remorse after drinking? 0  8. How often during the last year have you been unable to remember what happened the night before because you had been drinking? 0  9. Have you or someone else been injured as a result of your drinking? 0  10. Has a relative or friend or a doctor or another health worker been concerned about your drinking or suggested you cut down? 0  Alcohol Use Disorder Identification Test Final Score (AUDIT) 3      Patient-reported   A score of 3 or more in women, and 4 or more in men indicates increased risk for alcohol abuse, EXCEPT if all of the points are from question 1   Vision/Hearing Screen: No results found.  Last CBC Lab Results  Component Value Date   WBC 5.9 02/18/2024   HGB 12.4 02/18/2024   HCT 37.4 02/18/2024   MCV 93.8 02/18/2024   MCH 31.2 06/19/2023   RDW 15.4 02/18/2024   PLT 268.0 02/18/2024   Last metabolic panel Lab Results  Component Value Date   GLUCOSE 89 02/18/2024   NA 144 02/18/2024   K 3.8 02/18/2024   CL 108 02/18/2024   CO2 28 02/18/2024   BUN 12 02/18/2024   CREATININE 0.73 02/18/2024   GFR 86.90 02/18/2024   CALCIUM 9.3 02/18/2024   PROT 6.5 02/18/2024   ALBUMIN 4.3 02/18/2024   BILITOT 0.4 02/18/2024   ALKPHOS 99 02/18/2024   AST 15 02/18/2024   ALT 15 02/18/2024   ANIONGAP 8 06/19/2023   Last lipids No results found for: "CHOL", "HDL", "LDLCALC", "LDLDIRECT", "TRIG", "CHOLHDL" Last hemoglobin A1c No results found for: "HGBA1C" Last thyroid functions No results found for: "TSH", "T3TOTAL", "T4TOTAL", "THYROIDAB"  Last vitamin D No results found for: "25OHVITD2", "25OHVITD3", "VD25OH" Last  vitamin B12 and Folate No results found for: "VITAMINB12", "FOLATE"    No results found for any visits on 04/01/24.    Assessment & Plan   Annual wellness visit done today including the all of the following: Reviewed patient's Family Medical History Reviewed and updated list of patient's medical providers Assessment of cognitive impairment was done Assessed patient's functional ability Established a written schedule for health screening services Health Risk Assessent Completed and Reviewed  Exercise Activities and Dietary recommendations  Goals   Weight loss down to 125#     Immunization History  Administered Date(s) Administered   Influenza, Seasonal, Injecte, Preservative Fre 10/23/2023   Influenza,inj,Quad PF,6+ Mos 11/29/2015, 02/09/2020, 01/09/2022   PFIZER(Purple Top)SARS-COV-2 Vaccination 08/11/2020   Pneumococcal Polysaccharide-23 12/29/2020, 01/09/2022   Tdap 10/23/2012, 02/25/2024    Health Maintenance  Topic Date Due   HIV Screening  Never done   Hepatitis C Screening  Never done   Zoster Vaccines- Shingrix (1 of 2) Never done   Cervical Cancer Screening (HPV/Pap Cotest)  Never done   Colonoscopy  Never done   Pneumococcal Vaccine 10-72 Years old (3 of 3 - PCV) 01/09/2023   COVID-19 Vaccine (2 - Pfizer risk series) 08/13/2025 (Originally 09/01/2020)   INFLUENZA VACCINE  07/25/2024   Medicare Annual Wellness (AWV)  04/01/2025   MAMMOGRAM  02/14/2026   DTaP/Tdap/Td (3 - Td or Tdap) 02/24/2034   HPV VACCINES  Aged Out   Lung Cancer Screening  Discontinued     Discussed health benefits of physical activity, and encouraged her to engage in regular exercise appropriate for her age and condition.    Problem List Items Addressed This Visit   None Visit Diagnoses       Well adult health check    -  Primary   Relevant Orders   Hemoglobin A1c   Lipid panel   TSH     Colon cancer screening       Relevant Orders   Cologuard     Need for hepatitis C  screening test       Relevant Orders   Hepatitis C Antibody     Encounter for screening for HIV       Relevant Orders   HIV Antibody (routine testing w rflx)     Intractable chronic migraine without aura and with status migrainosus       Relevant Medications   rizatriptan (MAXALT-MLT) 10 MG disintegrating tablet      Assessment and Plan    Migraine Chronic migraines managed with Aimovig, Fioricet, and sumatriptan. Fioricet not ideal for migraines. Rizatriptan preferred over sumatriptan and covered by insurance. Discussed rebound headaches risk due to medication overuse. - Prescribe rizatriptan 10 mg, 12 per month, take one tab sublingually as needed, may repeat in two hours, max two tabs in 24 hours. - Discontinue sumatriptan. - Consult neurologist regarding rizatriptan use. - Monitor for rebound headaches.  Weight Gain 25-pound weight gain over six months despite limited intake and exercise. Interested in [place]. Discussed lab results to determine appropriate medication. Considered Tirzepatide with prior authorization. - Order lab tests including A1c. - Review lab results and discuss Wegovy or Tirzepatide based on results. - Communicate lab results and plan via MyChart.  Insomnia Chronic insomnia with difficulty staying asleep. Currently on Remeron 45 mg and Benadryl. Higher doses of Remeron may not aid sleep; lower doses might help. Discussed trying a lower dose. - Instruct to cut  Remeron dose in half for one to two weeks and monitor improvement. - Consider different medication if no improvement.  Lung Cancer (Adenocarcinoma) Adenocarcinoma treated with radiation. Follow-up with radiation oncology in September. Reports scar tissue, no active cancer. Annual scans needed for recurrence monitoring. - Continue follow-up with radiation oncology in September. - Ensure annual scans are performed.  General Health Maintenance Due for screenings and vaccinations. Prefers Cologuard over  colonoscopy. Follows with eye doctor, 20/20 vision with glasses. No routine dental care. Needs shingles and pneumonia vaccines at pharmacy. - Schedule Pap smear. - Order Cologuard for colorectal cancer screening. - Advise to receive shingles and pneumonia vaccines at pharmacy. - Ensure follow-up with eye doctor.  Follow-up Upcoming cardiology and radiation oncology appointments. Follow-up on lab results and medication adjustments needed. - Follow up with cardiology on 04/14. - Follow up with radiation oncology in September. - Review lab results and communicate plan via MyChart.       Return in about 4 weeks (around 04/29/2024).  Will complete pap smear at follow up.    Maretta Bees, PA

## 2024-04-01 NOTE — Patient Instructions (Signed)
 Stop sumatriptan, switch to rizatriptan. Take as needed for migraine, dissolve under tongue.  Cut your remeron in half and start taking only 22.5mg  nightly. Let me know if this helps with your sleep.  Labs updated today. Will call in weight loss medication pending lab results.  Please return for pap smear and follow up in one month

## 2024-04-02 LAB — HEPATITIS C ANTIBODY: Hepatitis C Ab: NONREACTIVE

## 2024-04-02 LAB — LIPID PANEL
Cholesterol: 151 mg/dL (ref 0–200)
HDL: 81.8 mg/dL (ref >39.00)
LDL Cholesterol: 59 mg/dL (ref 0–99)
NonHDL: 68.79
Total CHOL/HDL Ratio: 2
Triglycerides: 49 mg/dL (ref 0.0–149.0)
VLDL: 9.8 mg/dL (ref 0.0–40.0)

## 2024-04-02 LAB — HEMOGLOBIN A1C: Hgb A1c MFr Bld: 5.4 % (ref 4.6–6.5)

## 2024-04-02 LAB — HIV ANTIBODY (ROUTINE TESTING W REFLEX): HIV 1&2 Ab, 4th Generation: NONREACTIVE

## 2024-04-02 LAB — TSH: TSH: 1.01 u[IU]/mL (ref 0.35–5.50)

## 2024-04-03 ENCOUNTER — Encounter: Payer: Self-pay | Admitting: Urgent Care

## 2024-04-03 DIAGNOSIS — I2510 Atherosclerotic heart disease of native coronary artery without angina pectoris: Secondary | ICD-10-CM | POA: Diagnosis not present

## 2024-04-03 DIAGNOSIS — E785 Hyperlipidemia, unspecified: Secondary | ICD-10-CM | POA: Diagnosis not present

## 2024-04-04 LAB — NMR, LIPOPROFILE
Cholesterol, Total: 163 mg/dL (ref 100–199)
HDL Particle Number: 44.1 umol/L (ref >=30.5)
HDL-C: 78 mg/dL (ref >39)
LDL Particle Number: 716 nmol/L (ref <1000)
LDL Size: 20.2 nm — ABNORMAL LOW (ref >20.5)
LDL-C (NIH Calc): 74 mg/dL (ref 0–99)
LP-IR Score: <25 (ref <=45)
Small LDL Particle Number: 347 nmol/L (ref <=527)
Triglycerides: 51 mg/dL (ref 0–149)

## 2024-04-04 LAB — COMPREHENSIVE METABOLIC PANEL WITH GFR
ALT: 22 IU/L (ref 0–32)
AST: 21 IU/L (ref 0–40)
Albumin: 4.5 g/dL (ref 3.9–4.9)
Alkaline Phosphatase: 100 IU/L (ref 44–121)
BUN/Creatinine Ratio: 10 — ABNORMAL LOW (ref 12–28)
BUN: 9 mg/dL (ref 8–27)
Bilirubin Total: 0.2 mg/dL (ref 0.0–1.2)
CO2: 25 mmol/L (ref 20–29)
Calcium: 9.3 mg/dL (ref 8.7–10.3)
Chloride: 103 mmol/L (ref 96–106)
Creatinine, Ser: 0.88 mg/dL (ref 0.57–1.00)
Globulin, Total: 1.8 g/dL (ref 1.5–4.5)
Glucose: 74 mg/dL (ref 70–99)
Potassium: 4.3 mmol/L (ref 3.5–5.2)
Sodium: 143 mmol/L (ref 134–144)
Total Protein: 6.3 g/dL (ref 6.0–8.5)
eGFR: 73 mL/min/{1.73_m2} (ref >59)

## 2024-04-06 NOTE — Progress Notes (Signed)
 " Cardiology Office Note:  .   Date:  04/08/2024 ID:  [name], DOB 05/06/1961, MRN [id number] PCP: Lindsay Benton CROME, PA [facility] Providers Cardiologist:  None   Patient Profile: .      PMH Coronary calcification seen on CT Lung CA screening CT 05/03/23 Mild calcified plaque Former tobacco abuse Pulmonary nodule concerning for carcinoma Bronchoscopy 06/19/23 revealed adenocarcinoma XRT Aortic atherosclerosis COPD  Hyperlipidemia  Referred to cardiology and seen by me on 11/14/2023 for new patient consult for coronary calcification noted on CT scan. She reported chronic fatigue and exertional dyspnea. Felt her HR increase at times, causing her to feel short of breath.  She wonders if this is a panic attack.  Long-time cigarette smoker, she has received 3 radiation treatments for lung cancer.  She quit smoking a few months ago. She occasionally takes a few puffs but does not inhale. Feels pressure in her chest at times and notes occasional ankle swelling. Has slept in a slightly elevated position for a while. Is not very active, only with housework. Does not know how long cholesterol has been elevated but started on atorvastatin  in October. Family history of heart disease with father who had a cardiac stent. Started taking aspirin  for risk reduction after seeing a commercial on TV.  Identifies as typical country cook frequently seasons vegetables with bacon grease. Does use olive or canola oil. Admits to frequently eating bacon and sausage as her husband and her particularly enjoyed breakfast food.  Activity is limited to housework.  No regular exercise.  Her ASCVD risk score was 9.9%.  Her father has CAD and had stent placed.  Atorvastatin  was increased to 20 mg daily due to LDL 145.  She underwent coronary CTA 12/11/2023 which revealed coronary calcium  score 6.71 (51st percentile), minimal nonobstructive CAD.   NMR 04/03/2024 revealed LDL particle number 716, LDL-C 74, HDL-C 78,  triglycerides 51, total cholesterol 163, small LDL-P 347.        History of Present Illness: .    History of Present Illness [name] is a very pleasant 63 y.o. female  who is here today with her husband for follow-up of mild CAD. She reports she is feeling well and has had no concerning cardiac symptoms. She has chronic  shortness of breath, attributed to COPD, which she feels is stable. She denies orthopnea, PND, edema, chest pain or palpitations. She has quit smoking, but admits to occasional 'tokes' when around others who smoke. Her cholesterol has improved significantly, with her LDL dropping from 145 to 74. She has been adhering to a healthier diet, including turkey bacon and sausage rather than pork, and is planning to incorporate more fresh and frozen vegetables. She has not yet started an exercise regimen, but plans to begin walking regularly. She also reports a change in insurance policy, which is based on her diagnosis of coronary artery disease. She is seeking verification of this chronic condition for her new policy which will give her an allowance to buy fresh fruit and vegetables.   ROS: See HPI       Studies Reviewed: .           Risk Assessment/Calculations:             Physical Exam:   VS: BP 120/60   Pulse 99   Ht 5' 2 (1.575 m)   Wt 142 lb 9.6 oz (64.7 kg)   SpO2 99%   BMI 26.08 kg/m   Wt Readings  from Last 3 Encounters:  04/07/24 142 lb 9.6 oz (64.7 kg)  04/01/24 144 lb (65.3 kg)  03/17/24 144 lb 8 oz (65.5 kg)     GEN: Well nourished, well developed in no acute distress NECK: No JVD; No carotid bruits CARDIAC: RRR, no murmurs, rubs, gallops RESPIRATORY:  Clear to auscultation without rales, wheezing or rhonchi  ABDOMEN: Soft, non-tender, non-distended EXTREMITIES:  No edema; No deformity     ASSESSMENT AND PLAN: .    Coronary artery calcification/Aortic atherosclerosis: Mild calcifications noted on recent lung CT that led her to see cardiology.  She was having occasional chest pressure and shortness of breath. Coronary CTA completed 12/11/2023 revealed CAC score 6.71 (51st percentile, with only focal (less than 24%) calcified plaque in proximal to mid OM. Reports she is feeling well and is planning to start walking for exercise. No symptoms concerning for angina. No bleeding concerns. Continue aspirin , atorvastatin . Focus on secondary prevention including heart healthy mostly plant based diet avoiding saturated fat, processed foods, simple carbohydrates, and sugar along with aiming for at least 150 minutes of moderate intensity exercise each week.   Shortness of breath: Long history of cigarette smoking and COPD.  She reports shortness of breath is stable.  No orthopnea, PND, edema.  Weight is stable.  She appears euvolemic on exam. I have encouraged her to gradually increase exercise to aim for 30 minutes of walking daily.  Dyslipidemia LDL goal < 70: Lipid panel completed 04/01/24 revealed total cholesterol 151, HDL 81, LDL 59, triglycerides 49.  Significant improvement from LDL of 145 October 2024.  She is not having any concerning side effects on atorvastatin  20 mg daily.  We will continue this dose.  Former tobacco abuse: 46-pack-year history with history of COPD and lung adenocarcinoma. She reports she has quit smoking with occasional puffs but no inhaling. Complete cessation advised.     Disposition: 1 year with me  Signed, Rosaline Bane, NP-C "

## 2024-04-07 ENCOUNTER — Ambulatory Visit (HOSPITAL_BASED_OUTPATIENT_CLINIC_OR_DEPARTMENT_OTHER): Payer: Medicare Other | Admitting: Nurse Practitioner

## 2024-04-07 ENCOUNTER — Encounter (HOSPITAL_BASED_OUTPATIENT_CLINIC_OR_DEPARTMENT_OTHER): Payer: Self-pay | Admitting: Nurse Practitioner

## 2024-04-07 VITALS — BP 120/60 | HR 99 | Ht 62.0 in | Wt 142.6 lb

## 2024-04-07 DIAGNOSIS — R0602 Shortness of breath: Secondary | ICD-10-CM

## 2024-04-07 DIAGNOSIS — I2510 Atherosclerotic heart disease of native coronary artery without angina pectoris: Secondary | ICD-10-CM | POA: Diagnosis not present

## 2024-04-07 DIAGNOSIS — Z720 Tobacco use: Secondary | ICD-10-CM | POA: Diagnosis not present

## 2024-04-07 DIAGNOSIS — E785 Hyperlipidemia, unspecified: Secondary | ICD-10-CM | POA: Diagnosis not present

## 2024-04-07 NOTE — Patient Instructions (Addendum)
 Medication Instructions:  Your physician recommends that you continue on your current medications as directed. Please refer to the Current Medication list given to you today.  Follow-Up: At [facility], you and your health needs are our priority.  As part of our continuing mission to provide you with exceptional heart care, our providers are all part of one team.  This team includes your primary Cardiologist (physician) and Advanced Practice Providers or APPs (Physician Assistants and Nurse Practitioners) who all work together to provide you with the care you need, when you need it.  Your next appointment:   1 YEAR WITH Slater Duncan, NP

## 2024-04-08 ENCOUNTER — Encounter (HOSPITAL_BASED_OUTPATIENT_CLINIC_OR_DEPARTMENT_OTHER): Payer: Self-pay | Admitting: Nurse Practitioner

## 2024-04-10 ENCOUNTER — Other Ambulatory Visit

## 2024-04-10 ENCOUNTER — Encounter

## 2024-04-22 DIAGNOSIS — Z1211 Encounter for screening for malignant neoplasm of colon: Secondary | ICD-10-CM | POA: Diagnosis not present

## 2024-04-28 DIAGNOSIS — M545 Low back pain, unspecified: Secondary | ICD-10-CM | POA: Diagnosis not present

## 2024-04-28 DIAGNOSIS — M5481 Occipital neuralgia: Secondary | ICD-10-CM | POA: Diagnosis not present

## 2024-04-28 DIAGNOSIS — G894 Chronic pain syndrome: Secondary | ICD-10-CM | POA: Diagnosis not present

## 2024-04-28 DIAGNOSIS — Z79891 Long term (current) use of opiate analgesic: Secondary | ICD-10-CM | POA: Diagnosis not present

## 2024-04-28 DIAGNOSIS — Z79899 Other long term (current) drug therapy: Secondary | ICD-10-CM | POA: Diagnosis not present

## 2024-04-29 ENCOUNTER — Other Ambulatory Visit (HOSPITAL_COMMUNITY)
Admission: RE | Admit: 2024-04-29 | Discharge: 2024-04-29 | Disposition: A | Discharge status: Home / Self Care | Source: Ambulatory Visit | Attending: Urgent Care | Admitting: Urgent Care

## 2024-04-29 ENCOUNTER — Other Ambulatory Visit: Payer: Self-pay | Admitting: Physician Assistant

## 2024-04-29 ENCOUNTER — Ambulatory Visit (INDEPENDENT_AMBULATORY_CARE_PROVIDER_SITE_OTHER): Admitting: Urgent Care

## 2024-04-29 VITALS — BP 159/101 | HR 78 | Wt 144.8 lb

## 2024-04-29 DIAGNOSIS — L309 Dermatitis, unspecified: Secondary | ICD-10-CM

## 2024-04-29 DIAGNOSIS — G43711 Chronic migraine without aura, intractable, with status migrainosus: Secondary | ICD-10-CM

## 2024-04-29 DIAGNOSIS — Z1151 Encounter for screening for human papillomavirus (HPV): Secondary | ICD-10-CM | POA: Diagnosis not present

## 2024-04-29 DIAGNOSIS — M545 Low back pain, unspecified: Secondary | ICD-10-CM

## 2024-04-29 DIAGNOSIS — Z124 Encounter for screening for malignant neoplasm of cervix: Secondary | ICD-10-CM

## 2024-04-29 DIAGNOSIS — Z01419 Encounter for gynecological examination (general) (routine) without abnormal findings: Secondary | ICD-10-CM | POA: Insufficient documentation

## 2024-04-29 DIAGNOSIS — R030 Elevated blood-pressure reading, without diagnosis of hypertension: Secondary | ICD-10-CM | POA: Diagnosis not present

## 2024-04-29 LAB — COLOGUARD: COLOGUARD: POSITIVE — AB

## 2024-04-29 MED ORDER — UBRELVY 50 MG PO TABS
1.0000 | ORAL_TABLET | ORAL | 3 refills | Status: DC | PRN
Start: 2024-04-29 — End: 2024-05-05

## 2024-04-29 MED ORDER — TRIAMCINOLONE ACETONIDE 0.5 % EX CREA
1.0000 | TOPICAL_CREAM | Freq: Two times a day (BID) | CUTANEOUS | 3 refills | Status: AC
Start: 2024-04-29 — End: ?

## 2024-04-29 MED ORDER — CLONIDINE HCL 0.1 MG PO TABS: ORAL_TABLET | ORAL | 11 refills | Status: DC

## 2024-04-29 NOTE — Patient Instructions (Addendum)
 Please discontinue the clobetasol  ointment. Instead, start the triamcinolone  cream. Use this twice daily until resolved, not to exceed 14 days consecutive use.  I called in Ubrelvy. You can take this as needed for migraine management.  We completed your vaginal cancer screening test today.  Please monitor your blood pressure at home over the next week. Return for an in office recheck with me next week, bring your home blood pressure cuff. If you continue to get readings >160/90, take one tablet of clonidine as needed. This medication can cause drowsiness so do not drive after taking.

## 2024-04-30 ENCOUNTER — Encounter: Payer: Self-pay | Admitting: Urgent Care

## 2024-04-30 DIAGNOSIS — R195 Other fecal abnormalities: Secondary | ICD-10-CM

## 2024-04-30 LAB — CYTOLOGY - PAP
Chlamydia: NEGATIVE
Comment: NEGATIVE
Comment: NEGATIVE
Comment: NEGATIVE
Comment: NORMAL
Diagnosis: NEGATIVE
High risk HPV: NEGATIVE
Neisseria Gonorrhea: NEGATIVE
Trichomonas: NEGATIVE

## 2024-04-30 NOTE — Progress Notes (Signed)
 Established Patient Office Visit  Subjective:  Patient ID: [name], female    DOB: 09-Jan-1961  Age: 63 y.o. MRN: [id number]  Chief Complaint  Patient presents with   Gynecologic Exam    4 week follow up and Pap smear. She states the rash on her leg has returned. She was seen at cardiology yesterday and her blood pressure was elevated.     63yo female presents primarily to update pap smear. She had a hysterectomy in the 1990s due to cervical cancer. States her R ovary is still present. She continues to get vaginal screenings to r/o VIN due to her hx of cancer. Last one was 3+ years ago. Denies pelvic/ vaginal complaints.  She does continue to have migraines. Follows with neurology, but denies any recent medication changes. She remains on Aimovig, PRN nurtec and PRN sumatriptan . She did not tolerate the rizatriptan , reports numerous side effects. She was told by neuro to discontinue the butalbital. She notes her BP is high today in our office, denies any prior hx of HTN and is not on medication for this. Recently saw cardiology in which BP was perfect. Does have headache today, but denies CP/palps.  Additionally, she feels like her rash is coming back to her L shin. Has used clobetasol  ointment intermittently which helps to some degree. Also has rash on B palms.  Gynecologic Exam   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 (HCC) 06/27/2023   Primary osteoarthritis of right hip 02/08/2021   COPD with acute exacerbation (HCC) 02/02/2021   Abnormal screening CT of chest 02/09/2020   Healthcare maintenance 02/09/2020   COPD with chronic bronchitis and emphysema (HCC) 10/28/2019   Tobacco abuse 10/28/2019   Cervical spondylosis without myelopathy 08/04/2013   Intractable migraine without aura 03/13/2013   Past Medical History:  Diagnosis Date   Allergy    codeine,topamax,lyrica   Anxiety    Arthritis    Cancer of cervix  (HCC)    Cataract    Cervical spondylosis without myelopathy 08/04/2013   COPD (chronic obstructive pulmonary disease) (HCC)    Coronary artery calcification    DDD (degenerative disc disease), lumbar    Depression    Dyspnea    on exertion   Emphysema of lung (HCC)    Fibromyalgia    GERD (gastroesophageal reflux disease)    History of radiation therapy    Right lung- 08/13/23-08/20/23- Dr. Retta Caster   Migraine without aura, with intractable migraine, so stated, without mention of status migrainosus 12/2020   Osteoporosis    Scoliosis    Past Surgical History:  Procedure Laterality Date   ABDOMINAL HYSTERECTOMY     BRONCHIAL BIOPSY  06/19/2023   Procedure: BRONCHIAL BIOPSIES;  Surgeon: Denson Flake, MD;  Location: [facility] ENDOSCOPY;  Service: Pulmonary;;   BRONCHIAL BRUSHINGS  06/19/2023   Procedure: BRONCHIAL BRUSHINGS;  Surgeon: Denson Flake, MD;  Location: [facility] ENDOSCOPY;  Service: Pulmonary;;   BRONCHIAL NEEDLE ASPIRATION BIOPSY  06/19/2023   Procedure: BRONCHIAL NEEDLE ASPIRATION BIOPSIES;  Surgeon: Denson Flake, MD;  Location: [facility] ENDOSCOPY;  Service: Pulmonary;;   BRONCHIAL WASHINGS  06/19/2023   Procedure: BRONCHIAL WASHINGS;  Surgeon: Denson Flake, MD;  Location: [facility] ENDOSCOPY;  Service: Pulmonary;;   CARPAL TUNNEL RELEASE Left    CESAREAN SECTION     x 1   COLONOSCOPY     FIDUCIAL MARKER PLACEMENT  06/19/2023   Procedure: FIDUCIAL  MARKER PLACEMENT;  Surgeon: Denson Flake, MD;  Location: [facility] ENDOSCOPY;  Service: Pulmonary;;   JOINT REPLACEMENT     right hip   left ring finger surgery     w/ pins   LUMBAR LAMINECTOMY     MULTIPLE TOOTH EXTRACTIONS     has full dentures   NECK SURGERY     x 2   SPINE SURGERY     front and back neck   TOTAL HIP ARTHROPLASTY Right 02/08/2021   Procedure: RIGHT TOTAL HIP ARTHROPLASTY ANTERIOR APPROACH;  Surgeon: Dayne Even, MD;  Location: [facility] ORS;  Service: Orthopedics;  Laterality: Right;   TUBAL LIGATION     ULNAR  NERVE TRANSPOSITION Left    Social History   Tobacco Use   Smoking status: Former    Current packs/day: 0.00    Average packs/day: 1 pack/day for 49.0 years (49.0 ttl pk-yrs)    Types: Cigarettes    Quit date: 08/09/2023    Years since quitting: 0.7   Smokeless tobacco: Never  Vaping Use   Vaping status: Never Used  Substance Use Topics   Alcohol use: Yes    Alcohol/week: 9.0 standard drinks of alcohol    Types: 2 Cans of beer, 7 Standard drinks or equivalent per week    Comment: beer/mixed drinks   Drug use: No      ROS: as noted in HPI  Objective:     BP (!) 159/101   Pulse 78   Wt 144 lb 12.8 oz (65.7 kg)   SpO2 98%   BMI 26.48 kg/m  BP Readings from Last 3 Encounters:  04/29/24 (!) 159/101  04/07/24 120/60  04/01/24 (!) 159/86   Wt Readings from Last 3 Encounters:  04/29/24 144 lb 12.8 oz (65.7 kg)  04/07/24 142 lb 9.6 oz (64.7 kg)  04/01/24 144 lb (65.3 kg)      Physical Exam Vitals and nursing note reviewed. Exam conducted with a chaperone present.  Constitutional:      General: She is not in acute distress.    Appearance: Normal appearance. She is not ill-appearing, toxic-appearing or diaphoretic.  HENT:     Head: Normocephalic and atraumatic.     Mouth/Throat:     Mouth: Mucous membranes are moist.  Eyes:     General: No scleral icterus.       Right eye: No discharge.        Left eye: No discharge.     Extraocular Movements: Extraocular movements intact.     Pupils: Pupils are equal, round, and reactive to light.  Cardiovascular:     Rate and Rhythm: Normal rate and regular rhythm.     Heart sounds: No murmur heard. Pulmonary:     Effort: Pulmonary effort is normal. No respiratory distress.     Breath sounds: Normal breath sounds. No stridor. No wheezing or rhonchi.  Genitourinary:    Pubic Area: No rash or pubic lice.      Labia:        Right: No rash, tenderness, lesion or injury.        Left: No rash, tenderness, lesion or injury.       Urethra: No prolapse, urethral pain, urethral swelling or urethral lesion.     Vagina: Normal. No vaginal discharge or lesions.     Uterus: Absent.      Adnexa: Right adnexa normal and left adnexa normal.       Right: No mass, tenderness or fullness.  Left: No mass, tenderness or fullness.       Rectum: Normal.     Comments: Thin prep swab of vaginal tissue Musculoskeletal:     Cervical back: Normal range of motion and neck supple. No rigidity or tenderness.  Lymphadenopathy:     Cervical: No cervical adenopathy.     Lower Body: No right inguinal adenopathy. No left inguinal adenopathy.  Skin:    General: Skin is warm and dry.     Coloration: Skin is not jaundiced.     Findings: Rash (scaling, thick rash to B palms, and L distal shin) present. No bruising.  Neurological:     General: No focal deficit present.     Mental Status: She is alert and oriented to person, place, and time.     Motor: No weakness.     Gait: Gait normal.     No results found for any visits on 04/29/24.  Last CBC Lab Results  Component Value Date   WBC 5.9 02/18/2024   HGB 12.4 02/18/2024   HCT 37.4 02/18/2024   MCV 93.8 02/18/2024   MCH 31.2 06/19/2023   RDW 15.4 02/18/2024   PLT 268.0 02/18/2024   Last metabolic panel Lab Results  Component Value Date   GLUCOSE 74 04/03/2024   NA 143 04/03/2024   K 4.3 04/03/2024   CL 103 04/03/2024   CO2 25 04/03/2024   BUN 9 04/03/2024   CREATININE 0.88 04/03/2024   EGFR 73 04/03/2024   CALCIUM  9.3 04/03/2024   PROT 6.3 04/03/2024   ALBUMIN 4.5 04/03/2024   LABGLOB 1.8 04/03/2024   BILITOT 0.2 04/03/2024   ALKPHOS 100 04/03/2024   AST 21 04/03/2024   ALT 22 04/03/2024   ANIONGAP 8 06/19/2023      The 10-year ASCVD risk score (Arnett DK, et al., 2019) is: 5.6%  Assessment & Plan:  Intractable chronic migraine without aura and with status migrainosus Florette Hurry; Take 1 tablet (50 mg total) by mouth as needed (for migraine).  Dispense:  16 tablet; Refill: 3  Cervical cancer screening -     Cytology - PAP -     Cytology - PAP  Dermatitis -     Triamcinolone  Acetonide; Apply 1 Application topically 2 (two) times daily. To affected areas.Do not exceed 14 days consecutively.  Dispense: 30 g; Refill: 3  Elevated blood pressure reading -     cloNIDine HCl; Take 1 tab PO PRN BP >160/90  Dispense: 60 tablet; Refill: 11  Headache today could be in part due to elevated BP, however this is a new finding all prior BP readings have been WNL. Migraines have been chronic. Pt did not tolerate rizatriptan , so will try ubrelvy instead.  Will stop steroid ointment and transition to triamcinolone  cream.  For now, given recheck of BP going 180/100, will give PRN clonidine. Not administered in office due to sedation side effect and pt needing to drive home. Pt to take PRN if BP fluctuates at home. Encouraged pt to monitor home BP readings.  Vaginal swab thin prep obtained - pt does not have cervix or uterus. Swab to r/o VIN.  Return in about 1 week (around 05/06/2024). For BP recheck.  Mandy Second, PA

## 2024-05-01 ENCOUNTER — Telehealth: Payer: Self-pay

## 2024-05-01 ENCOUNTER — Other Ambulatory Visit: Payer: Self-pay | Admitting: Urgent Care

## 2024-05-01 DIAGNOSIS — R030 Elevated blood-pressure reading, without diagnosis of hypertension: Secondary | ICD-10-CM

## 2024-05-01 MED ORDER — CLONIDINE HCL 0.1 MG PO TABS: ORAL_TABLET | ORAL | 11 refills | Status: DC

## 2024-05-01 NOTE — Telephone Encounter (Signed)
 Noted.

## 2024-05-01 NOTE — Telephone Encounter (Signed)
 Pharmacy faxed script clarification for Clonidine. Please confirm if this medication is once daily or how often patient should take. Unable to process with current sig directions of 1 tab po PRN

## 2024-05-01 NOTE — Telephone Encounter (Signed)
 New script already faxed back for clarification. Thank you

## 2024-05-02 ENCOUNTER — Ambulatory Visit
Admission: RE | Admit: 2024-05-02 | Discharge: 2024-05-02 | Disposition: A | Discharge status: Home / Self Care | Source: Ambulatory Visit | Attending: Radiology

## 2024-05-02 ENCOUNTER — Ambulatory Visit

## 2024-05-02 DIAGNOSIS — N644 Mastodynia: Secondary | ICD-10-CM

## 2024-05-05 ENCOUNTER — Ambulatory Visit (INDEPENDENT_AMBULATORY_CARE_PROVIDER_SITE_OTHER): Admitting: Urgent Care

## 2024-05-05 VITALS — BP 163/91 | HR 77 | Wt 145.0 lb

## 2024-05-05 DIAGNOSIS — I700 Atherosclerosis of aorta: Secondary | ICD-10-CM

## 2024-05-05 DIAGNOSIS — L03116 Cellulitis of left lower limb: Secondary | ICD-10-CM | POA: Diagnosis not present

## 2024-05-05 DIAGNOSIS — G43711 Chronic migraine without aura, intractable, with status migrainosus: Secondary | ICD-10-CM | POA: Diagnosis not present

## 2024-05-05 DIAGNOSIS — R030 Elevated blood-pressure reading, without diagnosis of hypertension: Secondary | ICD-10-CM | POA: Diagnosis not present

## 2024-05-05 MED ORDER — SUMATRIPTAN SUCCINATE 100 MG PO TABS: 100.0000 mg | ORAL_TABLET | Freq: Once | ORAL | 0 refills | Status: DC

## 2024-05-05 MED ORDER — LISINOPRIL 5 MG PO TABS: 5.0000 mg | ORAL_TABLET | Freq: Every day | ORAL | 0 refills | Status: DC

## 2024-05-05 MED ORDER — AMOXICILLIN-POT CLAVULANATE 875-125 MG PO TABS: 1.0000 | ORAL_TABLET | Freq: Two times a day (BID) | ORAL | 0 refills | Status: AC

## 2024-05-05 NOTE — Progress Notes (Unsigned)
   Established Patient Office Visit  Subjective:  Patient ID: [name], female    DOB: 10-May-1961  Age: 63 y.o. MRN: [id number]  Chief Complaint  Patient presents with   Follow-up    Follow up on BP. She also states that the rash on her leg is still bad and it itches. She would like a referral to allergy and asthma.    HPI  {History (Optional):23778}  ROS: as noted in HPI  Objective:     BP (!) 163/91   Pulse 77   Wt 145 lb (65.8 kg)   SpO2 96%   BMI 26.52 kg/m  {Vitals History (Optional):23777}  Physical Exam   No results found for any visits on 05/05/24.  {Labs (Optional):23779}  The 10-year ASCVD risk score (Arnett DK, et al., 2019) is: 5.9%  Assessment & Plan:  There are no diagnoses linked to this encounter.   No follow-ups on file.   Mandy Second, PA

## 2024-05-05 NOTE — Patient Instructions (Addendum)
 Restart taking your augmentin , twice daily with food. This should help with lower leg infection.  Please start taking lisinopril 5mg  daily. This should help with blood pressure. Continue to monitor your readings at home, goal <130/80. Take clonidine  ONLY if BP remains >160/90 despite lisinopril use. Please obtain a renal artery ultrasound as ordered.  I have refilled your sumatriptan  - take as needed for migraine.  Please schedule a 3 week follow up.

## 2024-05-06 ENCOUNTER — Encounter: Payer: Self-pay | Admitting: Urgent Care

## 2024-05-06 ENCOUNTER — Ambulatory Visit: Admitting: Urgent Care

## 2024-05-09 ENCOUNTER — Encounter (HOSPITAL_COMMUNITY)

## 2024-05-09 ENCOUNTER — Ambulatory Visit (HOSPITAL_COMMUNITY)
Admission: RE | Admit: 2024-05-09 | Discharge: 2024-05-09 | Disposition: A | Discharge status: Home / Self Care | Source: Ambulatory Visit | Attending: Urgent Care | Admitting: Urgent Care

## 2024-05-09 ENCOUNTER — Ambulatory Visit: Payer: Self-pay | Admitting: Urgent Care

## 2024-05-09 DIAGNOSIS — R030 Elevated blood-pressure reading, without diagnosis of hypertension: Secondary | ICD-10-CM | POA: Diagnosis not present

## 2024-05-09 DIAGNOSIS — I700 Atherosclerosis of aorta: Secondary | ICD-10-CM | POA: Insufficient documentation

## 2024-05-12 ENCOUNTER — Ambulatory Visit: Payer: Self-pay

## 2024-05-12 NOTE — Telephone Encounter (Signed)
 Copied from CRM [id number]. Topic: Clinical - Red Word Triage >> May 12, 2024  1:10 PM [name] wrote: Red Word that prompted transfer to Nurse Triage: high blood pressure w/ headaches.  She thinks from not getting enough O2.  Results of overnight oximetry test/  11/19/2023 she never got   Chief Complaint: Shortness of breath  Symptoms: Shortness of breath, high blood pressure, headaches  Frequency: Intermittent  Disposition: [] ED /[] Urgent Care (no appt availability in office) / [x] Appointment(In office/virtual)/ []  [facility]/ [] Home Care/ [] Refused Recommended Disposition /[] [facility]/ []  Follow-up with PCP Additional Notes: Patient states that she has been experiencing increased shortness of breath for the last 6 months. She states that she has headaches after waking up and believes they may be due to low oxygen levels at night. She states she had a sleep study in November but that no one contacted her about the results. Patient reports she was recently placed on blood pressure medication but is still experiencing headaches. Appointment made for the patient tomorrow for evaluation.     E2C2 Pulmonary Triage - Initial Assessment Questions "Chief Complaint (e.g., cough, sob, wheezing, fever, chills, sweat or additional symptoms) *Go to specific symptom protocol after initial questions. Shortness of breath, headaches in the morning she believes are due to low oxygen levels at night  "How long have symptoms been present?" 6 months, worsening   Have you tested for COVID or Flu? Note: If not, ask patient if a home test can be taken. If so, instruct patient to call back for positive results. No  MEDICINES:   "Have you used any OTC meds to help with symptoms?" No If yes, ask "What medications?" N/A  "Have you used your inhalers/maintenance medication?" Yes If yes, "What medications?" Inhaler   If inhaler, ask "How many puffs and how often?" Note: Review instructions  on medication in the chart. 4-5 times a day   OXYGEN: "Do you wear supplemental oxygen?" No If yes, "How many liters are you supposed to use?" N/A  "Do you monitor your oxygen levels?" Yes If yes, "What is your reading (oxygen level) today?" 93%  "What is your usual oxygen saturation reading?"  (Note: Pulmonary O2 sats should be 90% or greater) 93%  Reason for Disposition  [1] Longstanding difficulty breathing (e.g., CHF, COPD, emphysema) AND [2] WORSE than normal    Pulm patient, appointment made for tomorrow morning  Answer Assessment - Initial Assessment Questions 1. RESPIRATORY STATUS: "Describe your breathing?" (e.g., wheezing, shortness of breath, unable to speak, severe coughing)      Shortness of breath  2. ONSET: "When did this breathing problem begin?"      Worse over the last 6 months  3. PATTERN "Does the difficult breathing come and go, or has it been constant since it started?"      Intermittent  4. SEVERITY: "How bad is your breathing?" (e.g., mild, moderate, severe)    - MILD: No SOB at rest, mild SOB with walking, speaks normally in sentences, can lie down, no retractions, pulse < 100.    - MODERATE: SOB at rest, SOB with minimal exertion and prefers to sit, cannot lie down flat, speaks in phrases, mild retractions, audible wheezing, pulse 100-120.    - SEVERE: Very SOB at rest, speaks in single words, struggling to breathe, sitting hunched forward, retractions, pulse > 120      Mild to moderate  5. RECURRENT SYMPTOM: "Have you had difficulty breathing before?" If Yes, ask: "  When was the last time?" and "What happened that time?"      Yes 6. CARDIAC HISTORY: "Do you have any history of heart disease?" (e.g., heart attack, angina, bypass surgery, angioplasty)      Yes 7. LUNG HISTORY: "Do you have any history of lung disease?"  (e.g., pulmonary embolus, asthma, emphysema)     Yes 8. CAUSE: "What do you think is causing the breathing problem?"      COPD 9. OTHER  SYMPTOMS: "Do you have any other symptoms? (e.g., dizziness, runny nose, cough, chest pain, fever)     Headaches, high blood pressure  Protocols used: Breathing Difficulty-A-AH

## 2024-05-13 ENCOUNTER — Encounter: Payer: Self-pay | Admitting: Primary Care

## 2024-05-13 ENCOUNTER — Ambulatory Visit: Admitting: Primary Care

## 2024-05-13 VITALS — BP 164/76 | HR 73 | Ht 62.0 in | Wt 143.2 lb

## 2024-05-13 DIAGNOSIS — Z23 Encounter for immunization: Secondary | ICD-10-CM | POA: Diagnosis not present

## 2024-05-13 DIAGNOSIS — J441 Chronic obstructive pulmonary disease with (acute) exacerbation: Secondary | ICD-10-CM | POA: Diagnosis not present

## 2024-05-13 MED ORDER — PREDNISONE 10 MG PO TABS
ORAL_TABLET | ORAL | 0 refills | Status: DC
Start: 2024-05-13 — End: 2024-08-28

## 2024-05-13 MED ORDER — BREZTRI AEROSPHERE 160-9-4.8 MCG/ACT IN AERO: 2.0000 | INHALATION_SPRAY | Freq: Two times a day (BID) | RESPIRATORY_TRACT | 6 refills | Status: AC

## 2024-05-13 NOTE — Progress Notes (Signed)
 @Patient  ID: [name], female    DOB: 1961-05-02, 63 y.o.   MRN: [id number]  Chief Complaint  Patient presents with   Follow-up    Dyspnea upon exertion, pt states "can't get my breath", pt reports no chest pain    Referring provider: Mandy Second, PA  HPI: 63 year old female, former smoker. PMH COPD, non-small lung cancer right upper lobe, osteoarthritis. Patient of Dr. Linder Revere.   05/13/2024 Discussed the use of AI scribe software for clinical note transcription with the patient, who gave verbal consent to proceed.  History of Present Illness   [name] is a 63 year old female with COPD and non-small cell lung cancer who presents for follow-up of her respiratory conditions.  She has a history of COPD and non-small cell lung cancer in the right upper lobe, diagnosed after a bronchoscopy in June of the previous year. She underwent radiation therapy and has since quit smoking. Her last CT scan in March showed mild emphysema, some scarring, and a stable hypermetabolic nodule in the right upper lobe, unchanged since December 2024. She is scheduled for another CT scan before her follow-up in September.  For her COPD, she is currently using Breztri  and albuterol . She experiences nocturnal shortness of breath but no symptoms of sleep apnea such as snoring or waking up gasping. ONO in November 20024 indicated the need for nocturnal oxygen but the test is outdated and needs to be repeated. She spent about two hours with an oxygen level below 88% during the night.  She has a cough with clear, stringy mucus. She is currently on her last refill of Breztri  and has a flutter valve, which she acknowledges she should use more regularly.  She is currently on Augmentin  for cellulitis on her leg, which developed after a cat scratch. Her leg has been swelling and slow to heal. She has taken multiple courses of antibiotics for this condition.      Allergies  Allergen Reactions   Codeine Nausea  Only   Topamax [Topiramate] Hives   Lyrica [Pregabalin] Swelling    Immunization History  Administered Date(s) Administered   Influenza, Seasonal, Injecte, Preservative Fre 10/23/2023   Influenza,inj,Quad PF,6+ Mos 11/29/2015, 02/09/2020, 01/09/2022   PFIZER(Purple Top)SARS-COV-2 Vaccination 08/11/2020   Pneumococcal Polysaccharide-23 12/29/2020, 01/09/2022   Tdap 10/23/2012, 02/25/2024    Past Medical History:  Diagnosis Date   Allergy    codeine,topamax,lyrica   Anxiety    Arthritis    Cancer of cervix (HCC)    Cataract    Cervical spondylosis without myelopathy 08/04/2013   COPD (chronic obstructive pulmonary disease) (HCC)    Coronary artery calcification    DDD (degenerative disc disease), lumbar    Depression    Dyspnea    on exertion   Emphysema of lung (HCC)    Fibromyalgia    GERD (gastroesophageal reflux disease)    History of radiation therapy    Right lung- 08/13/23-08/20/23- Dr. Retta Caster   Migraine without aura, with intractable migraine, so stated, without mention of status migrainosus 12/2020   Osteoporosis    Scoliosis     Tobacco History: Social History   Tobacco Use  Smoking Status Former   Current packs/day: 0.00   Average packs/day: 1 pack/day for 49.0 years (49.0 ttl pk-yrs)   Types: Cigarettes   Quit date: 08/09/2023   Years since quitting: 0.7  Smokeless Tobacco Never   Counseling given: Not Answered   Outpatient Medications Prior to Visit  Medication  Sig Dispense Refill   AIMOVIG 140 MG/ML SOAJ      albuterol  (VENTOLIN  HFA) 108 (90 Base) MCG/ACT inhaler INHALE 1 TO 2 PUFFS EVERY 6 HOURS AS NEEDED FOR WHEEZING OR SHORTNESS OF BREATH. 54 g 3   amoxicillin -clavulanate (AUGMENTIN ) 875-125 MG tablet Take 1 tablet by mouth 2 (two) times daily with a meal for 10 days. 20 tablet 0   aspirin  EC 81 MG tablet Take 81 mg by mouth daily. Swallow whole.     atorvastatin  (LIPITOR) 20 MG tablet Take 1 tablet (20 mg total) by mouth daily. 90  tablet 3   Biotin 1000 MCG CHEW Chew 1,000 mcg by mouth daily.     Budeson-Glycopyrrol-Formoterol  (BREZTRI  AEROSPHERE) 160-9-4.8 MCG/ACT AERO Inhale 2 puffs into the lungs 2 (two) times daily. 10.7 g 6   butalbital-acetaminophen -caffeine (FIORICET) 50-325-40 MG tablet Take 1 tablet by mouth 2 (two) times daily as needed for headache or migraine.     cloNIDine  (CATAPRES ) 0.1 MG tablet Take 1 tab PO once daily PRN BP >160/90 (Patient not taking: Reported on 05/05/2024) 60 tablet 11   cyanocobalamin (VITAMIN B12) 1000 MCG tablet Take 2,000 mcg by mouth daily.     diclofenac (VOLTAREN) 75 MG EC tablet Take 75 mg by mouth 2 (two) times daily.     Erenumab-aooe (AIMOVIG, 140 MG DOSE, [place]) Inject 140 mg into the skin every 30 (thirty) days.     famotidine (PEPCID) 20 MG tablet Take 20 mg by mouth 2 (two) times daily as needed for heartburn or indigestion.     lamoTRIgine (LAMICTAL) 100 MG tablet Take 100 mg by mouth 2 (two) times daily.      lisinopril  (ZESTRIL ) 5 MG tablet Take 1 tablet (5 mg total) by mouth daily. 30 tablet 0   mirtazapine  (REMERON ) 45 MG tablet Take 1 tablet (45 mg total) by mouth at bedtime.     mupirocin  ointment (BACTROBAN ) 2 % Apply 1 Application topically 3 (three) times daily. 22 g 0   ondansetron  (ZOFRAN ) 4 MG tablet Take 4 mg by mouth every 8 (eight) hours as needed for nausea or vomiting.     Oxycodone  HCl 10 MG TABS Take 10 mg by mouth every 6 (six) hours.     Respiratory Therapy Supplies (FLUTTER) DEVI Use as directed. 1 each 0   SUMAtriptan  (IMITREX ) 100 MG tablet Take 1 tablet (100 mg total) by mouth once for 1 dose. May repeat in 2 hours if headache persists or recurs. 12 tablet 0   tiZANidine  (ZANAFLEX ) 4 MG tablet Take 4 mg by mouth 3 (three) times daily.     triamcinolone  cream (KENALOG ) 0.5 % Apply 1 Application topically 2 (two) times daily. To affected areas.Do not exceed 14 days consecutively. 30 g 3   No facility-administered medications prior to visit.     Review of Systems  Review of Systems  Constitutional: Negative.   HENT: Negative.    Respiratory: Negative.    Cardiovascular: Negative.   Psychiatric/Behavioral: Negative.       Physical Exam  There were no vitals taken for this visit. Physical Exam Constitutional:      Appearance: Normal appearance.  HENT:     Head: Normocephalic and atraumatic.  Cardiovascular:     Rate and Rhythm: Normal rate and regular rhythm.  Pulmonary:     Effort: Pulmonary effort is normal.     Breath sounds: Wheezing present. No rhonchi or rales.  Musculoskeletal:        General: Normal range of  motion.  Skin:    General: Skin is warm and dry.  Neurological:     General: No focal deficit present.     Mental Status: She is alert and oriented to person, place, and time. Mental status is at baseline.  Psychiatric:        Mood and Affect: Mood normal.        Behavior: Behavior normal.        Thought Content: Thought content normal.        Judgment: Judgment normal.      Lab Results:  CBC    Component Value Date/Time   WBC 5.9 02/18/2024 1153   RBC 3.99 02/18/2024 1153   HGB 12.4 02/18/2024 1153   HCT 37.4 02/18/2024 1153   PLT 268.0 02/18/2024 1153   MCV 93.8 02/18/2024 1153   MCH 31.2 06/19/2023 0923   MCHC 33.0 02/18/2024 1153   RDW 15.4 02/18/2024 1153   LYMPHSABS 0.9 02/18/2024 1153   MONOABS 0.4 02/18/2024 1153   EOSABS 0.3 02/18/2024 1153   BASOSABS 0.0 02/18/2024 1153    BMET    Component Value Date/Time   NA 143 04/03/2024 0946   K 4.3 04/03/2024 0946   CL 103 04/03/2024 0946   CO2 25 04/03/2024 0946   GLUCOSE 74 04/03/2024 0946   GLUCOSE 89 02/18/2024 1153   BUN 9 04/03/2024 0946   CREATININE 0.88 04/03/2024 0946   CALCIUM  9.3 04/03/2024 0946   GFRNONAA >60 06/19/2023 0923   GFRAA  05/23/2009 2036    >60        The eGFR has been calculated using the MDRD equation. This calculation has not been validated in all clinical situations. eGFR's  persistently <60 mL/min signify possible Chronic Kidney Disease.    BNP No results found for: "BNP"  ProBNP No results found for: "PROBNP"  Imaging: VAS US  RENAL ARTERY DUPLEX Result Date: 05/09/2024 ABDOMINAL VISCERAL Patient Name:  LEONTINE RADMAN  Date of Exam:   05/09/2024 Medical Rec #: [id number]     Accession #:    [id number] Date of Birth: 09-18-1961     Patient Gender: F Patient Age:   71 years Exam Location:  [address] Procedure:      VAS US  RENAL ARTERY DUPLEX Referring Phys: [id number] WHITNEY L CRAIN -------------------------------------------------------------------------------- Indications: Hyptertension High Risk Factors: Hypertension. Comparison Study: None. Performing Technologist: Estanislao Heimlich  Examination Guidelines: A complete evaluation includes B-mode imaging, spectral Doppler, color Doppler, and power Doppler as needed of all accessible portions of each vessel. Bilateral testing is considered an integral part of a complete examination. Limited examinations for reoccurring indications may be performed as noted.  Duplex Findings: +--------------------+--------+--------+------+--------+ Mesenteric          PSV cm/sEDV cm/sPlaqueComments +--------------------+--------+--------+------+--------+ Aorta Prox             83                          +--------------------+--------+--------+------+--------+ Aorta Distal           92                          +--------------------+--------+--------+------+--------+ Celiac Artery Origin  179                          +--------------------+--------+--------+------+--------+ SMA Origin            163                          +--------------------+--------+--------+------+--------+    +------------------+--------+--------+-------+  Right Renal ArteryPSV cm/sEDV cm/sComment +------------------+--------+--------+-------+ Origin              104      22           +------------------+--------+--------+-------+  Proximal             92      22           +------------------+--------+--------+-------+ Mid                 206      45           +------------------+--------+--------+-------+ Distal              109      21           +------------------+--------+--------+-------+ +-----------------+--------+--------+-------+ Left Renal ArteryPSV cm/sEDV cm/sComment +-----------------+--------+--------+-------+ Origin             189      24           +-----------------+--------+--------+-------+ Proximal           217      21           +-----------------+--------+--------+-------+ Mid                 78      18           +-----------------+--------+--------+-------+ Distal              79      20           +-----------------+--------+--------+-------+ +------------+--------+--------+----+-----------+--------+--------+----+ Right KidneyPSV cm/sEDV cm/sRI  Left KidneyPSV cm/sEDV cm/sRI   +------------+--------+--------+----+-----------+--------+--------+----+ Upper Pole  36      10      0.72Upper Pole 38      9       0.76 +------------+--------+--------+----+-----------+--------+--------+----+ Mid         61      17      0.        47      13      0.72 +------------+--------+--------+----+-----------+--------+--------+----+ Lower Pole  47      11      0.77Lower Pole 45      12      0.73 +------------+--------+--------+----+-----------+--------+--------+----+ Hilar       66      14      0.79Hilar      66      13      0.80 +------------+--------+--------+----+-----------+--------+--------+----+ +------------------+----+------------------++ Right Kidney          Left Kidney        +------------------+----+------------------++ RAR                   RAR                +------------------+----+------------------++ RAR (manual)          RAR (manual)       +------------------+----+------------------++ Cortex                Cortex              +------------------+----+------------------++ Cortex thickness      Corex thickness    +------------------+----+------------------++ Kidney length (cm)9.80Kidney length (cm) +------------------+----+------------------++  Summary: Renal:  Right: Normal size right kidney. Normal right Resisitive Index.        1-59% stenosis of the right renal artery. RRV flow present. Left:  Normal size of left kidney. Normal left Resistive Index.  1-59% stenosis of the left renal artery. LRV flow present.  *See table(s) above for measurements and observations.  Diagnosing physician: Delaney Fearing  Electronically signed by Delaney Fearing on 05/09/2024 at 11:41:43 AM.    Final    MM 3D DIAGNOSTIC MAMMOGRAM UNILATERAL RIGHT BREAST Result Date: 05/05/2024 CLINICAL DATA:  63 year old female who states she was having right breast pain that has resolved. She has no current complaints. EXAM: DIGITAL DIAGNOSTIC UNILATERAL RIGHT MAMMOGRAM WITH TOMOSYNTHESIS AND CAD TECHNIQUE: Right digital diagnostic mammography and breast tomosynthesis was performed. The images were evaluated with computer-aided detection. COMPARISON:  Previous exam(s). ACR Breast Density Category b: There are scattered areas of fibroglandular density. FINDINGS: No suspicious mass, malignant type microcalcifications or distortion detected in the right breast. IMPRESSION: No evidence of malignancy in the right breast. RECOMMENDATION: Bilateral screening mammogram in February of 2026 is recommended. I have discussed the findings and recommendations with the patient. If applicable, a reminder letter will be sent to the patient regarding the next appointment. BI-RADS CATEGORY  1: Negative. Electronically Signed   By: Dina  Arceo M.D.   On: 05/05/2024 07:57     Assessment & Plan:   1. COPD with acute exacerbation (HCC) (Primary) - Pulse oximetry, overnight; Future - budesonide -glycopyrrolate -formoterol  (BREZTRI  AEROSPHERE) 160-9-4.8 MCG/ACT AERO  inhaler; Inhale 2 puffs into the lungs 2 (two) times daily.  Dispense: 10.7 g; Refill: 6  2. Immunization due - RSV,Recombinant PF (Arexvy)  Assessment and Plan    Chronic obstructive pulmonary disease (COPD) COPD with wheezing and stringy, clear mucus production. No fevers or purulent sputum. Mild bronchial thickening on CT scan. Current medications include Breztri  and albuterol . Reports shortness of breath but no symptoms of sleep apnea. Previous overnight oximetry indicated nocturnal hypoxemia with oxygen levels dropping below 88% for approximately two hours. 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 - Repeat overnight oximetry to confirm need for nocturnal oxygen - Initiate nocturnal oxygen therapy based on oximetry results  Non-small cell lung cancer, right upper lobe Non-small cell lung cancer in the right upper lobe, status post radiation therapy. The right upper lobe nodule has been well-managed since December 2024 with no changes on the March 2025 CT scan. Follow-up CT scan scheduled before September 2025.  Emphysema Mild emphysema on CT scan. No acute changes or exacerbations.  Cellulitis of leg Cellulitis of the leg, likely secondary to a cat scratch. Currently on Augmentin . Reports persistent swelling and difficulty healing. - Advise leg elevation to improve blood flow and aid  healing - Recommend compression stockings or ace wrap during the day to reduce swelling  Antonio Baumgarten, NP 05/13/2024

## 2024-05-13 NOTE — Telephone Encounter (Signed)
Pt has appt this morning 

## 2024-05-13 NOTE — Addendum Note (Signed)
 Addended by: Antonio Baumgarten on: 05/13/2024 03:03 PM   Modules accepted: Orders

## 2024-05-13 NOTE — Patient Instructions (Addendum)
-  CHRONIC OBSTRUCTIVE PULMONARY DISEASE (COPD): COPD is a chronic lung condition that makes it hard to breathe. You will continue using your Breztri  inhaler and albuterol . We recommend over-the-counter Mucinex and staying hydrated to help thin and clear mucus. Use your flutter valve three times daily. We are prescribing a short course of prednisone  for your wheezing. We will repeat your overnight oximetry to check your oxygen levels at night and assess your daytime oxygen levels while walking. Based on the results, we may start nocturnal oxygen therapy. Please contact us  if your cough persists, as you may need antibiotics.  -OXYGEN DEPENDENCY AT NIGHT: Your oxygen levels drop below 88% for about two hours at night, likely due to COPD. We will repeat your overnight oximetry to confirm if you need oxygen therapy at night. If needed, we will start nocturnal oxygen therapy.  -NON-SMALL CELL LUNG CANCER, RIGHT UPPER LOBE: This is a type of lung cancer that you have in the right upper lobe of your lung. It has been stable since your last CT scan in March 2025. You are scheduled for another CT scan before your follow-up in September 2025.  -EMPHYSEMA: Emphysema is a type of COPD that damages the air sacs in your lungs. Your CT scan shows mild emphysema with no acute changes or worsening.  -CELLULITIS OF LEG: Cellulitis is a bacterial skin infection. You are currently taking Amoxicillin  for this. To help with the swelling and healing, elevate your leg and use compression stockings or an ace wrap during the day. Do not wear compression at night.  INSTRUCTIONS: Please follow up with a repeat overnight oximetry to assess your nocturnal oxygen levels. We will also check your daytime oxygen levels while you are walking. You are scheduled for a follow-up CT scan before your next appointment in September 2025. If your cough persists, contact us  for a potential antibiotic treatment.  Orders: Walk test to qualify for  oxygen  ONO   Follow-up: 3 months with Dr. Baldwin Levee (changing from Dr. Linder Revere)

## 2024-05-23 ENCOUNTER — Encounter: Payer: Self-pay | Admitting: Internal Medicine

## 2024-05-23 ENCOUNTER — Encounter: Payer: Self-pay | Admitting: Urgent Care

## 2024-05-23 ENCOUNTER — Ambulatory Visit: Admitting: Urgent Care

## 2024-05-23 ENCOUNTER — Telehealth (INDEPENDENT_AMBULATORY_CARE_PROVIDER_SITE_OTHER): Admitting: Urgent Care

## 2024-05-23 DIAGNOSIS — L309 Dermatitis, unspecified: Secondary | ICD-10-CM

## 2024-05-23 DIAGNOSIS — I10 Essential (primary) hypertension: Secondary | ICD-10-CM | POA: Diagnosis not present

## 2024-05-23 MED ORDER — LISINOPRIL 10 MG PO TABS: 10.0000 mg | ORAL_TABLET | Freq: Every day | ORAL | 0 refills | Status: DC

## 2024-05-23 NOTE — Patient Instructions (Addendum)
 Increase your current lisinopril  dose to 10mg  daily (take two 5mg  tabs until current supply is exhausted). Monitor BP over the next two weeks, goal <140/90.  Switch to Sarna OTC lotion instead of daily steroid cream to your leg.   Return in 2-3 weeks for BP recheck. Location of recheck: (please call to schedule) [facility] at [facility] [address] [place], [place] [address] Phone: [phone]

## 2024-05-23 NOTE — Progress Notes (Signed)
 Virtual Visit via Video Note  I connected with [name] on 05/23/24 at  2:40 PM EDT by a video enabled telemedicine application and verified that I am speaking with the correct person using two identifiers.  Patient Location: Parked car Provider Location: Office/Clinic  I discussed the limitations, risks, security, and privacy concerns of performing an evaluation and management service by video and the availability of in person appointments. I also discussed with the patient that there may be a patient responsible charge related to this service. The patient expressed understanding and agreed to proceed.   Subjective  PCP: Mandy Second, PA  Chief Complaint  Patient presents with   Follow-up    HPI:   Pt presents for virtual consultation primarily for a follow-up of her blood pressure management.  Her blood pressure remains elevated, with recent readings of 157/87 mmHg and 150/86 mmHg. She started taking lisinopril  5 mg daily on May 05, 2024, and has been on it for three weeks. She feels less energetic since starting the medication but has not experienced any other side effects. She has not needed to use clonidine  since starting lisinopril . A renal ultrasound was performed, and the results were normal. Her blood pressure at the last visit was 163/91 mmHg indicating mild improvement.  She reports her L lower leg is improving but still itches and peels. She attributes the itching to dry skin. She has been using a steroid cream for the itching and recently obtained a refill. The skin has scarring from scratching but is no longer red or warm.       ROS: Per HPI. All other pertinent systems are negative.   Current Outpatient Medications:    AIMOVIG 140 MG/ML SOAJ, , Disp: , Rfl:    albuterol  (VENTOLIN  HFA) 108 (90 Base) MCG/ACT inhaler, INHALE 1 TO 2 PUFFS EVERY 6 HOURS AS NEEDED FOR WHEEZING OR SHORTNESS OF BREATH., Disp: 54 g, Rfl: 3   aspirin  EC 81 MG tablet, Take 81 mg by  mouth daily. Swallow whole., Disp: , Rfl:    atorvastatin  (LIPITOR) 20 MG tablet, Take 1 tablet (20 mg total) by mouth daily., Disp: 90 tablet, Rfl: 3   Biotin 1000 MCG CHEW, Chew 1,000 mcg by mouth daily., Disp: , Rfl:    budesonide -glycopyrrolate -formoterol  (BREZTRI  AEROSPHERE) 160-9-4.8 MCG/ACT AERO inhaler, Inhale 2 puffs into the lungs 2 (two) times daily., Disp: 10.7 g, Rfl: 6   butalbital-acetaminophen -caffeine (FIORICET) 50-325-40 MG tablet, Take 1 tablet by mouth 2 (two) times daily as needed for headache or migraine., Disp: , Rfl:    cloNIDine  (CATAPRES ) 0.1 MG tablet, Take 1 tab PO once daily PRN BP >160/90, Disp: 60 tablet, Rfl: 11   cyanocobalamin (VITAMIN B12) 1000 MCG tablet, Take 2,000 mcg by mouth daily., Disp: , Rfl:    diclofenac (VOLTAREN) 75 MG EC tablet, Take 75 mg by mouth 2 (two) times daily., Disp: , Rfl:    Erenumab-aooe (AIMOVIG, 140 MG DOSE, [place]), Inject 140 mg into the skin every 30 (thirty) days., Disp: , Rfl:    famotidine (PEPCID) 20 MG tablet, Take 20 mg by mouth 2 (two) times daily as needed for heartburn or indigestion., Disp: , Rfl:    lamoTRIgine (LAMICTAL) 100 MG tablet, Take 100 mg by mouth 2 (two) times daily. , Disp: , Rfl:    lisinopril  (ZESTRIL ) 5 MG tablet, Take 1 tablet (5 mg total) by mouth daily., Disp: 30 tablet, Rfl: 0   mirtazapine  (REMERON ) 45 MG tablet, Take 1 tablet (  45 mg total) by mouth at bedtime., Disp: , Rfl:    mupirocin  ointment (BACTROBAN ) 2 %, Apply 1 Application topically 3 (three) times daily., Disp: 22 g, Rfl: 0   ondansetron  (ZOFRAN ) 4 MG tablet, Take 4 mg by mouth every 8 (eight) hours as needed for nausea or vomiting., Disp: , Rfl:    Oxycodone  HCl 10 MG TABS, Take 10 mg by mouth every 6 (six) hours., Disp: , Rfl:    predniSONE  (DELTASONE ) 10 MG tablet, 4 tabs for 2 days, then 3 tabs for 2 days, 2 tabs for 2 days, then 1 tab for 2 days, then stop, Disp: 20 tablet, Rfl: 0   Respiratory Therapy Supplies (FLUTTER) DEVI, Use as  directed., Disp: 1 each, Rfl: 0   SUMAtriptan  (IMITREX ) 100 MG tablet, Take 1 tablet (100 mg total) by mouth once for 1 dose. May repeat in 2 hours if headache persists or recurs., Disp: 12 tablet, Rfl: 0   tiZANidine  (ZANAFLEX ) 4 MG tablet, Take 4 mg by mouth 3 (three) times daily., Disp: , Rfl:    triamcinolone  cream (KENALOG ) 0.5 %, Apply 1 Application topically 2 (two) times daily. To affected areas.Do not exceed 14 days consecutively., Disp: 30 g, Rfl: 3    Objective  Vital signs not able to be obtained due to this being a virtual visit.  Physical Exam Vitals and nursing note reviewed.  Constitutional:      General: She is not in acute distress.    Appearance: Normal appearance. She is not ill-appearing, toxic-appearing or diaphoretic.  HENT:     Head: Normocephalic.  Eyes:     General:        Right eye: No discharge.        Left eye: No discharge.  Pulmonary:     Effort: Pulmonary effort is normal. No respiratory distress.  Skin:    General: Skin is warm and dry.     Findings: No erythema or rash.     Comments: Peeling dry skin to LLE without erythema  Neurological:     Mental Status: She is alert and oriented to person, place, and time.      Assessment & Plan Essential hypertension  1. Essential hypertension  Dermatitis  1. Essential hypertension  2. Dermatitis  Assessment and Plan    Hypertension Hypertension remains elevated despite lisinopril  5 mg, which reduced systolic pressure by 10 points. Renal ultrasound normal. No adverse effects from lisinopril , but reports decreased energy. - Increase lisinopril  to 10 mg daily using two 5 mg tablets until current supply is exhausted. - Prescribe lisinopril  10 mg tablets for future use. - Monitor blood pressure over the next two weeks. Consider further dose adjustment if readings remain elevated. - Schedule follow-up in three weeks to reassess blood pressure management.  Leg dermatitis Leg dermatitis improving  with resolved redness and warmth. Current symptoms include itching and peeling due to dry skin. Long-term steroid cream use not desirable. - Continue current steroid cream until finished. - Recommend over-the-counter Sarna lotion for dry, itchy skin after finishing steroid cream.       No follow-ups on file.   I discussed the assessment and treatment plan with the patient. The patient was provided an opportunity to ask questions, and all were answered. The patient agreed with the plan and demonstrated an understanding of the instructions.   The patient was advised to call back or seek an in-person evaluation if the symptoms worsen or if the condition fails to improve as anticipated.  The above assessment and management plan was discussed with the patient. The patient verbalized understanding of and has agreed to the management plan.   Mandy Second, PA

## 2024-06-02 ENCOUNTER — Telehealth: Payer: Self-pay

## 2024-06-02 DIAGNOSIS — G894 Chronic pain syndrome: Secondary | ICD-10-CM | POA: Diagnosis not present

## 2024-06-02 DIAGNOSIS — M5481 Occipital neuralgia: Secondary | ICD-10-CM | POA: Diagnosis not present

## 2024-06-02 DIAGNOSIS — L03116 Cellulitis of left lower limb: Secondary | ICD-10-CM

## 2024-06-02 DIAGNOSIS — M545 Low back pain, unspecified: Secondary | ICD-10-CM | POA: Diagnosis not present

## 2024-06-02 NOTE — Telephone Encounter (Signed)
 Please advise   Copied from CRM [id number]. Topic: Clinical - Medical Advice >> Jun 02, 2024 11:49 AM Baldo Levan wrote: Reason for CRM: Patient called in stating she has been on the antibiotics for her left leg and it had cleared up but now it is back again, Patient is asking if she should get a referral to the disease control center because she cant keep taking the antibiotics per the patient. Please advise the patient.

## 2024-06-03 MED ORDER — CEPHALEXIN 500 MG PO CAPS: 500.0000 mg | ORAL_CAPSULE | Freq: Four times a day (QID) | ORAL | 0 refills | Status: AC

## 2024-06-03 NOTE — Telephone Encounter (Signed)
 No further action needed at this time.

## 2024-06-03 NOTE — Addendum Note (Signed)
 Addended by: Hazelgrace Bonham on: 06/03/2024 10:27 AM   Modules accepted: Orders

## 2024-06-06 ENCOUNTER — Ambulatory Visit
Admission: RE | Admit: 2024-06-06 | Discharge: 2024-06-06 | Disposition: A | Discharge status: Home / Self Care | Source: Ambulatory Visit | Attending: Physician Assistant | Admitting: Physician Assistant

## 2024-06-06 DIAGNOSIS — M47816 Spondylosis without myelopathy or radiculopathy, lumbar region: Secondary | ICD-10-CM | POA: Diagnosis not present

## 2024-06-06 DIAGNOSIS — M4316 Spondylolisthesis, lumbar region: Secondary | ICD-10-CM | POA: Diagnosis not present

## 2024-06-06 DIAGNOSIS — M5126 Other intervertebral disc displacement, lumbar region: Secondary | ICD-10-CM | POA: Diagnosis not present

## 2024-06-06 DIAGNOSIS — M545 Low back pain, unspecified: Secondary | ICD-10-CM

## 2024-06-11 ENCOUNTER — Other Ambulatory Visit: Payer: Self-pay | Admitting: Urgent Care

## 2024-06-11 DIAGNOSIS — G43711 Chronic migraine without aura, intractable, with status migrainosus: Secondary | ICD-10-CM

## 2024-06-11 NOTE — Telephone Encounter (Signed)
 Copied from CRM [id number]. Topic: Clinical - Medication Refill >> Jun 11, 2024  3:21 PM [name] wrote: Medication: SUMAtriptan  (IMITREX ) 100 MG tablet  Has the patient contacted their pharmacy? Yes (Agent: If no, request that the patient contact the pharmacy for the refill. If patient does not wish to contact the pharmacy document the reason why and proceed with request.) (Agent: If yes, when and what did the pharmacy advise?)  This is the patient's preferred pharmacy:  [id number] [place], [place] - [address]  [address] Phone: [phone] Fax: [phone]  Is this the correct pharmacy for this prescription? Yes If no, delete pharmacy and type the correct one.   Has the prescription been filled recently? Yes  Is the patient out of the medication? Yes  Has the patient been seen for an appointment in the last year OR does the patient have an upcoming appointment? Yes  Can we respond through MyChart? Yes  Agent: Please be advised that Rx refills may take up to 3 business days. We ask that you follow-up with your pharmacy.

## 2024-06-13 ENCOUNTER — Telehealth: Payer: Self-pay | Admitting: Urgent Care

## 2024-06-13 ENCOUNTER — Telehealth: Payer: Self-pay

## 2024-06-13 DIAGNOSIS — G43711 Chronic migraine without aura, intractable, with status migrainosus: Secondary | ICD-10-CM

## 2024-06-13 MED ORDER — SUMATRIPTAN SUCCINATE 100 MG PO TABS: ORAL_TABLET | ORAL | 2 refills | Status: AC

## 2024-06-13 NOTE — Telephone Encounter (Signed)
 Sent today by Laurina Popper, PA-c

## 2024-06-13 NOTE — Telephone Encounter (Signed)
 Patient calling office about her medication refill on IMITREX , please advise, thanks.

## 2024-06-13 NOTE — Telephone Encounter (Unsigned)
 Copied from CRM [id number]. Topic: General - Other >> Jun 13, 2024  1:43 PM Kevelyn M wrote: Reason for CRM: Patient is calling about migraine SUMAtriptan  (IMITREX ) 100 MG tablet, she's upset. She said if she doesn't get the meds today, she's going to find another doctor.

## 2024-06-20 ENCOUNTER — Ambulatory Visit: Admitting: Internal Medicine

## 2024-06-20 ENCOUNTER — Other Ambulatory Visit: Payer: Self-pay

## 2024-06-20 ENCOUNTER — Telehealth: Payer: Self-pay

## 2024-06-20 ENCOUNTER — Ambulatory Visit (HOSPITAL_COMMUNITY)
Admission: RE | Admit: 2024-06-20 | Discharge: 2024-06-20 | Disposition: A | Discharge status: Home / Self Care | Source: Ambulatory Visit | Attending: Internal Medicine

## 2024-06-20 ENCOUNTER — Encounter: Payer: Self-pay | Admitting: Internal Medicine

## 2024-06-20 VITALS — BP 127/82 | HR 87 | Resp 16 | Ht 62.0 in | Wt 148.0 lb

## 2024-06-20 DIAGNOSIS — M7989 Other specified soft tissue disorders: Secondary | ICD-10-CM

## 2024-06-20 DIAGNOSIS — L309 Dermatitis, unspecified: Secondary | ICD-10-CM

## 2024-06-20 NOTE — Progress Notes (Signed)
 [facility]  Reason for Consult:lle cellulitis Referring Provider: Lowella Folks (pcp)    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 (HCC) 06/27/2023   Primary osteoarthritis of right hip 02/08/2021   COPD with acute exacerbation (HCC) 02/02/2021   Abnormal screening CT of chest 02/09/2020   Healthcare maintenance 02/09/2020   COPD with chronic bronchitis and emphysema (HCC) 10/28/2019   Tobacco abuse 10/28/2019   Cervical spondylosis without myelopathy 08/04/2013   Intractable migraine without aura 03/13/2013      HPI: [name] is a 63 y.o. female with hx migraine, nsclc, copd, tobacco use referred by pcp for lle cellulitis  I reviewed epic notes Patient has had several abx courses including cephalexin  and augmentin  for persistent cellulitis left leg  She is currently on cephalexin   She has been basically on abx every month   The cat scratch was posterior to left calf but there redness was front  Initially she said the swelling/redness was most prominent but since then mild but persistent redness/slight swelling  No pain. Just lots of itch  No new medication prior to the cellulitis  Today nothing in the arm. Sx per her description mostly left leg  She also has been using kenalog  cream but not really better   ------------ Other chart hx  There was an initial ed visit 02/04/24 for bilateral arms and left leg erythematous rash -- given steroid/doxy. There was a reported cat bite/scratch  Oncology hx: Rul nodule on lung cancer ct chest screening 04/2023; pet avid  Bx'ed and confirmed nsclc 06/18/24 Sbrt completed 08/20/23 03/11/24 chest ct showed stable 8x6 mm nodules (rul) and no new lesions  Subsequent pcp's visit  Review of Systems: ROS All other ros negative      Past Medical History:  Diagnosis Date   Allergy    codeine,topamax,lyrica   Anxiety     Arthritis    Cancer of cervix (HCC)    Cataract    Cervical spondylosis without myelopathy 08/04/2013   COPD (chronic obstructive pulmonary disease) (HCC)    Coronary artery calcification    DDD (degenerative disc disease), lumbar    Depression    Dyspnea    on exertion   Emphysema of lung (HCC)    Fibromyalgia    GERD (gastroesophageal reflux disease)    History of radiation therapy    Right lung- 08/13/23-08/20/23- Dr. Lynwood Nasuti   Migraine without aura, with intractable migraine, so stated, without mention of status migrainosus 12/2020   Osteoporosis    Scoliosis     Social History   Tobacco Use   Smoking status: Former    Current packs/day: 0.00    Average packs/day: 1 pack/day for 49.0 years (49.0 ttl pk-yrs)    Types: Cigarettes    Quit date: 08/09/2023    Years since quitting: 0.8   Smokeless tobacco: Never  Vaping Use   Vaping status: Never Used  Substance Use Topics   Alcohol use: Yes    Alcohol/week: 9.0 standard drinks of alcohol    Types: 2 Cans of beer, 7 Standard drinks or equivalent per week    Comment: beer/mixed drinks   Drug use: No    Family History  Problem Relation Age of Onset   Cancer Father    Cancer Mother     Allergies  Allergen Reactions   Codeine Nausea Only   Topamax [Topiramate]  Hives   Lyrica [Pregabalin] Swelling    OBJECTIVE: There were no vitals filed for this visit. There is no height or weight on file to calculate BMI.   Physical Exam General/constitutional: no distress, pleasant HEENT: Normocephalic, PER, Conj Clear, EOMI, Oropharynx clear Neck supple CV: rrr no mrg Lungs: clear to auscultation, normal respiratory effort Abd: Soft, Nontender Ext: no edema Skin: no ulcer and no tenderness; mild swellign/erythema     Neuro: nonfocal MSK: no peripheral joint swelling/tenderness/warmth; back spines nontender  Lab: Lab Results  Component Value Date   WBC 5.9 02/18/2024   HGB 12.4 02/18/2024   HCT 37.4  02/18/2024   MCV 93.8 02/18/2024   PLT 268.0 02/18/2024   Last metabolic panel Lab Results  Component Value Date   GLUCOSE 74 04/03/2024   NA 143 04/03/2024   K 4.3 04/03/2024   CL 103 04/03/2024   CO2 25 04/03/2024   BUN 9 04/03/2024   CREATININE 0.88 04/03/2024   EGFR 73 04/03/2024   CALCIUM  9.3 04/03/2024   PROT 6.3 04/03/2024   ALBUMIN 4.5 04/03/2024   LABGLOB 1.8 04/03/2024   BILITOT 0.2 04/03/2024   ALKPHOS 100 04/03/2024   AST 21 04/03/2024   ALT 22 04/03/2024   ANIONGAP 8 06/19/2023    Microbiology:  Serology:  Imaging:   Assessment/plan: Problem List Items Addressed This Visit   None Visit Diagnoses       Leg swelling    -  Primary   Relevant Orders   VAS US  LOWER EXTREMITY VENOUS (DVT)     Dermatitis       Relevant Orders   VAS US  LOWER EXTREMITY VENOUS (DVT)   Ambulatory referral to Dermatology         History --> 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 And we considered nontypical bacterium spectrum as well, nonfitting with cellulitis picture   Swelling underlying cause ?dvt and so will order duplex  Other consideration ?eosinophilic dermatosis  Doesn't appear to be vasculitis  No prior meds or appearance to suggest fixed drug erupption  No trauma really or sign of complex regional pain syndrome  Referral to dermology    -u/s duplex -continue as needed kenalog  cream -no further abx -if concerned for true cellulitis can let me know and I/our clinic can take a look -derm referral -compression stocking -f/u as needed      Follow-up: No follow-ups on file.  Constance ONEIDA Passer, MD [facility] 06/20/2024, 9:11 AM

## 2024-06-20 NOTE — Patient Instructions (Signed)
 No further antibiotics please (pill or topical antibiotics). Not cellulitis. Let me know if you have concern for cellulitis   Ultrasound order to look for dvt  Dermatology referral (can find one near your house and go without referral too if it takes too long)  Ok for compression stocking/and kenalog  cream   Follow up as neeeded

## 2024-06-20 NOTE — Telephone Encounter (Signed)
 Called [employer] verified No PA needed for procedure code [id number] LLE Vasc US .  Patient scheduled at 1:30 PM today and aware of location and time of procedure.

## 2024-07-01 DIAGNOSIS — M545 Low back pain, unspecified: Secondary | ICD-10-CM | POA: Diagnosis not present

## 2024-07-01 DIAGNOSIS — G894 Chronic pain syndrome: Secondary | ICD-10-CM | POA: Diagnosis not present

## 2024-07-01 DIAGNOSIS — M5481 Occipital neuralgia: Secondary | ICD-10-CM | POA: Diagnosis not present

## 2024-07-09 ENCOUNTER — Telehealth: Payer: Self-pay

## 2024-07-09 NOTE — Telephone Encounter (Signed)
 Patient called in to report shooting pains to right breast and swelling to right breast.Mammogram and ultrasound were ordered in May 2025. Per patient ultrasound was not performed due to no issues being seen on mammogram. Patient is requesting a new order for breast ultrasound. Patient received radiation to right lung, she completed treatment on 08/20/23. Please advise

## 2024-07-09 NOTE — Progress Notes (Signed)
 Radiation Oncology         [phone] ________________________________  Name: [name] MRN: [id number]  Date: 07/10/2024  DOB: 12-01-1961  Follow-Up Visit Note  CC: Lindsay Benton CROME, PA  Lindsay Lamar RAMAN, MD  No diagnosis found.  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 She was last seen in office in office on 03/17/24 for a follow up visit. Patient continued to follow up with their specialists to manage their chronic conditions.   Of note: patient was having right breast pain that has since been resolved. She underwent a right diagnostic mammogram on 05/02/24 which showed no evidence of malignancy in the right breast.  No other significant oncologic interval history since the patient was last seen.                                    Allergies:  is allergic to codeine, topamax [topiramate], and lyrica [pregabalin].  Meds: Current Outpatient Medications  Medication Sig Dispense Refill   AIMOVIG 140 MG/ML SOAJ      albuterol  (VENTOLIN  HFA) 108 (90 Base) MCG/ACT inhaler INHALE 1 TO 2 PUFFS EVERY 6 HOURS AS NEEDED FOR WHEEZING OR SHORTNESS OF BREATH. 54 g 3   aspirin  EC 81 MG tablet Take 81 mg by mouth daily. Swallow whole.     atorvastatin  (LIPITOR) 20 MG tablet Take 1 tablet (20 mg total) by mouth daily. 90 tablet 3   Biotin 1000 MCG CHEW Chew 1,000 mcg by mouth daily.     budesonide -glycopyrrolate -formoterol  (BREZTRI  AEROSPHERE) 160-9-4.8 MCG/ACT AERO inhaler Inhale 2 puffs into the lungs 2 (two) times daily. 10.7 g 6   butalbital-acetaminophen -caffeine (FIORICET) 50-325-40 MG tablet Take 1 tablet by mouth 2  (two) times daily as needed for headache or migraine. (Patient not taking: Reported on 06/20/2024)     cephALEXin  (KEFLEX ) 500 MG capsule Take 500 mg by mouth 4 (four) times daily.     cloNIDine  (CATAPRES ) 0.1 MG tablet Take 1 tab PO once daily PRN BP >160/90 (Patient not taking: Reported on 06/20/2024) 60 tablet 11   cyanocobalamin (VITAMIN B12) 1000 MCG tablet Take 2,000 mcg by mouth daily.     diclofenac (VOLTAREN) 75 MG EC tablet Take 75 mg by mouth 2 (two) times daily.     doxycycline  (VIBRA -TABS) 100 MG tablet TAKE 1 TABLET BY MOUTH TWICE A DAY Oral; Duration: 7 Days (Patient not taking: Reported on 06/20/2024)     Erenumab-aooe (AIMOVIG, 140 MG DOSE, [place]) Inject 140 mg into the skin every 30 (thirty) days.     famotidine (PEPCID) 20 MG tablet Take 20 mg by mouth 2 (two) times daily as needed for heartburn or indigestion.     lamoTRIgine (LAMICTAL) 100 MG tablet Take 100 mg by mouth 2 (two) times daily.      lisinopril  (ZESTRIL ) 10 MG tablet Take 1 tablet (10 mg total) by mouth daily. 90 tablet 0   mirtazapine  (REMERON ) 45 MG tablet Take 1 tablet (45 mg total) by mouth at bedtime.     mupirocin  ointment (  BACTROBAN ) 2 % Apply 1 Application topically 3 (three) times daily. (Patient not taking: Reported on 06/20/2024) 22 g 0   ondansetron  (ZOFRAN ) 4 MG tablet Take 4 mg by mouth every 8 (eight) hours as needed for nausea or vomiting.     Oxycodone  HCl 10 MG TABS Take 10 mg by mouth every 6 (six) hours.     predniSONE  (DELTASONE ) 10 MG tablet 4 tabs for 2 days, then 3 tabs for 2 days, 2 tabs for 2 days, then 1 tab for 2 days, then stop (Patient not taking: Reported on 06/20/2024) 20 tablet 0   Respiratory Therapy Supplies (FLUTTER) DEVI Use as directed. (Patient not taking: Reported on 06/20/2024) 1 each 0   SUMAtriptan  (IMITREX ) 100 MG tablet Take one tab as needed for migraine. May repeat in 2 hours if headache persists or recurs. Do not exceed 2 tabs in 24 hours 12 tablet 2   tiZANidine  (ZANAFLEX ) 4 MG  tablet Take 4 mg by mouth 3 (three) times daily.     triamcinolone  cream (KENALOG ) 0.5 % Apply 1 Application topically 2 (two) times daily. To affected areas.Do not exceed 14 days consecutively. 30 g 3   No current facility-administered medications for this encounter.    Physical Findings: The patient is in no acute distress. Patient is alert and oriented.  vitals were not taken for this visit. .  No significant changes. Lungs are clear to auscultation bilaterally. Heart has regular rate and rhythm. No palpable cervical, supraclavicular, or axillary adenopathy. Abdomen soft, non-tender, normal bowel sounds.   Lab Findings: Lab Results  Component Value Date   WBC 5.9 02/18/2024   HGB 12.4 02/18/2024   HCT 37.4 02/18/2024   MCV 93.8 02/18/2024   PLT 268.0 02/18/2024    Radiographic Findings: VAS US  LOWER EXTREMITY VENOUS (DVT) Result Date: 06/20/2024  Lower Venous DVT Study Patient Name:  [name]  Date of Exam:   06/20/2024 Medical Rec #: [id number]     Accession #:    [id number] Date of Birth: [birth year]     Patient Gender: F Patient Age:   8 years Exam Location:  [address] Procedure:      VAS US  LOWER EXTREMITY VENOUS (DVT) Referring Phys: TRUNG VU --------------------------------------------------------------------------------  Indications: Swelling.  Risk Factors: Past pregnancy. Comparison Study: None. Performing Technologist: Garnette Rockers  Examination Guidelines: A complete evaluation includes B-mode imaging, spectral Doppler, color Doppler, and power Doppler as needed of all accessible portions of each vessel. Bilateral testing is considered an integral part of a complete examination. Limited examinations for reoccurring indications may be performed as noted. The reflux portion of the exam is performed with the patient in reverse Trendelenburg.  +-----+---------------+---------+-----------+----------+--------------+  RIGHTCompressibilityPhasicitySpontaneityPropertiesThrombus Aging +-----+---------------+---------+-----------+----------+--------------+ CFV  Full           Yes      Yes                                 +-----+---------------+---------+-----------+----------+--------------+   +---------+---------------+---------+-----------+----------+--------------+ LEFT     CompressibilityPhasicitySpontaneityPropertiesThrombus Aging +---------+---------------+---------+-----------+----------+--------------+ CFV      Full           Yes      Yes                                 +---------+---------------+---------+-----------+----------+--------------+ SFJ      Full                                                        +---------+---------------+---------+-----------+----------+--------------+  FV Prox  Full                                                        +---------+---------------+---------+-----------+----------+--------------+ FV Mid   Full                                                        +---------+---------------+---------+-----------+----------+--------------+ FV DistalFull                                                        +---------+---------------+---------+-----------+----------+--------------+ PFV      Full                                                        +---------+---------------+---------+-----------+----------+--------------+ POP      Full           Yes      Yes                                 +---------+---------------+---------+-----------+----------+--------------+ PTV      Full                    Yes                                 +---------+---------------+---------+-----------+----------+--------------+ PERO     Full                    Yes                                 +---------+---------------+---------+-----------+----------+--------------+     Summary: RIGHT: - No evidence of common femoral vein  obstruction.   LEFT: - There is no evidence of deep vein thrombosis in the lower extremity.  - No cystic structure found in the popliteal fossa.  *See table(s) above for measurements and observations. Electronically signed by Norman Serve on 06/20/2024 at 4:08:27 PM.    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  ***  Plan:  ***   *** minutes of total time was spent for this patient encounter, including preparation, face-to-face counseling with the patient and coordination of care, physical exam, and documentation of the encounter. ____________________________________  Lynwood CHARM Nasuti, PhD, MD  This document serves as a record of services personally performed by Lynwood Nasuti, MD. It was created on his behalf by Reymundo Cartwright, a trained medical scribe. The creation of this record is based on the scribe's personal observations and the provider's statements to them. This document  has been checked and approved by the attending provider.

## 2024-07-10 ENCOUNTER — Ambulatory Visit
Admission: RE | Admit: 2024-07-10 | Discharge: 2024-07-10 | Disposition: A | Discharge status: Home / Self Care | Source: Ambulatory Visit | Attending: Radiation Oncology | Admitting: Radiation Oncology

## 2024-07-10 ENCOUNTER — Encounter: Payer: Self-pay | Admitting: Radiation Oncology

## 2024-07-10 VITALS — BP 158/81 | Temp 97.0°F | Resp 19 | Ht 62.0 in | Wt 141.4 lb

## 2024-07-10 DIAGNOSIS — C3411 Malignant neoplasm of upper lobe, right bronchus or lung: Secondary | ICD-10-CM | POA: Diagnosis not present

## 2024-07-10 DIAGNOSIS — F17210 Nicotine dependence, cigarettes, uncomplicated: Secondary | ICD-10-CM | POA: Diagnosis not present

## 2024-07-10 MED ORDER — GABAPENTIN 100 MG PO CAPS: 100.0000 mg | ORAL_CAPSULE | Freq: Three times a day (TID) | ORAL | Status: DC

## 2024-07-10 MED ORDER — GABAPENTIN 100 MG PO CAPS: 100.0000 mg | ORAL_CAPSULE | Freq: Three times a day (TID) | ORAL | 1 refills | Status: DC

## 2024-07-10 NOTE — Progress Notes (Signed)
 [name] is here today for follow up post radiation to the lung.  Lung Side: Right, patient completed treatment on 08/20/23  Does the patient complain of any of the following: Pain: yes, reports pain to right chest when taking breaths. Also complains of pain to right breast.  Shortness of breath w/wo exertion: Yes mostly on exertion.  Cough: Yes-productive Hemoptysis: No Pain with swallowing: No Swallowing/choking concerns: No Appetite: good  Energy Level: Fair Post radiation skin Changes: No    Additional comments if applicable:   BP (!) 158/81   Temp (!) 97 F (36.1 C)   Resp 19   Ht 5' 2 (1.575 m)   Wt 141 lb 6.4 oz (64.1 kg)   SpO2 97%   BMI 25.86 kg/m

## 2024-07-28 DIAGNOSIS — L308 Other specified dermatitis: Secondary | ICD-10-CM | POA: Diagnosis not present

## 2024-07-29 DIAGNOSIS — M47817 Spondylosis without myelopathy or radiculopathy, lumbosacral region: Secondary | ICD-10-CM | POA: Diagnosis not present

## 2024-07-29 DIAGNOSIS — M5481 Occipital neuralgia: Secondary | ICD-10-CM | POA: Diagnosis not present

## 2024-07-29 DIAGNOSIS — G894 Chronic pain syndrome: Secondary | ICD-10-CM | POA: Diagnosis not present

## 2024-08-07 DIAGNOSIS — G43719 Chronic migraine without aura, intractable, without status migrainosus: Secondary | ICD-10-CM | POA: Diagnosis not present

## 2024-08-07 DIAGNOSIS — G5603 Carpal tunnel syndrome, bilateral upper limbs: Secondary | ICD-10-CM | POA: Diagnosis not present

## 2024-08-07 DIAGNOSIS — M5417 Radiculopathy, lumbosacral region: Secondary | ICD-10-CM | POA: Diagnosis not present

## 2024-08-07 DIAGNOSIS — M5412 Radiculopathy, cervical region: Secondary | ICD-10-CM | POA: Diagnosis not present

## 2024-08-07 DIAGNOSIS — Z79891 Long term (current) use of opiate analgesic: Secondary | ICD-10-CM | POA: Diagnosis not present

## 2024-08-11 ENCOUNTER — Ambulatory Visit: Admitting: Radiation Oncology

## 2024-08-14 ENCOUNTER — Telehealth: Payer: Self-pay | Admitting: *Deleted

## 2024-08-14 NOTE — Telephone Encounter (Signed)
 CALLED PATIENT TO INFORM OF CT FOR 09-17-24- ARRIVAL TIME- 12 PM @ [facility], NO RESTRICTIONS TO SCAN, PATIENT TO RECEIVE RESULTS FROM DR. KINARD ON 09-22-24 @ 11:45 AM, SPOKE WITH PATIENT AND SHE IS AWARE OF THESE APPTS. AND THE INSTRUCTIONS

## 2024-08-20 ENCOUNTER — Other Ambulatory Visit: Payer: Self-pay | Admitting: Urgent Care

## 2024-08-20 DIAGNOSIS — I10 Essential (primary) hypertension: Secondary | ICD-10-CM

## 2024-08-21 ENCOUNTER — Telehealth: Payer: Self-pay | Admitting: Urgent Care

## 2024-08-21 NOTE — Telephone Encounter (Unsigned)
 Copied from CRM #[id number]. Topic: Clinical - Medication Refill >> Aug 21, 2024  3:17 PM Fredrica W wrote: Medication: famotidine  (PEPCID ) 20 MG tablet  Has the patient contacted their pharmacy? Yes (Agent: If no, request that the patient contact the pharmacy for the refill. If patient does not wish to contact the pharmacy document the reason why and proceed with request.) (Agent: If yes, when and what did the pharmacy advise?) originally prescribed by previous provider  This is the patient's preferred pharmacy:  [id number] - [place], [place] - [address] [place] [address] Phone: [phone] Fax: [phone]  Is this the correct pharmacy for this prescription? Yes If no, delete pharmacy and type the correct one.   Has the prescription been filled recently? No  Is the patient out of the medication? No  Has the patient been seen for an appointment in the last year OR does the patient have an upcoming appointment? Yes  Can we respond through MyChart? No  Agent: Please be advised that Rx refills may take up to 3 business days. We ask that you follow-up with your pharmacy.   ----------------------------------------------------------------------- From previous Reason for Contact - Scheduling: Patient/patient representative is calling to schedule an appointment. Refer to attachments for appointment information.

## 2024-08-22 MED ORDER — FAMOTIDINE 20 MG PO TABS: 20.0000 mg | ORAL_TABLET | Freq: Two times a day (BID) | ORAL | 2 refills | Status: DC | PRN

## 2024-08-26 DIAGNOSIS — M5481 Occipital neuralgia: Secondary | ICD-10-CM | POA: Diagnosis not present

## 2024-08-26 DIAGNOSIS — M47817 Spondylosis without myelopathy or radiculopathy, lumbosacral region: Secondary | ICD-10-CM | POA: Diagnosis not present

## 2024-08-26 DIAGNOSIS — Z79899 Other long term (current) drug therapy: Secondary | ICD-10-CM | POA: Diagnosis not present

## 2024-08-26 DIAGNOSIS — G894 Chronic pain syndrome: Secondary | ICD-10-CM | POA: Diagnosis not present

## 2024-08-28 ENCOUNTER — Ambulatory Visit (INDEPENDENT_AMBULATORY_CARE_PROVIDER_SITE_OTHER): Admitting: Urgent Care

## 2024-08-28 ENCOUNTER — Encounter: Payer: Self-pay | Admitting: Urgent Care

## 2024-08-28 VITALS — BP 160/90 | HR 77 | Ht 62.0 in | Wt 143.0 lb

## 2024-08-28 DIAGNOSIS — L309 Dermatitis, unspecified: Secondary | ICD-10-CM | POA: Diagnosis not present

## 2024-08-28 DIAGNOSIS — R195 Other fecal abnormalities: Secondary | ICD-10-CM

## 2024-08-28 DIAGNOSIS — I776 Arteritis, unspecified: Secondary | ICD-10-CM | POA: Diagnosis not present

## 2024-08-28 DIAGNOSIS — I10 Essential (primary) hypertension: Secondary | ICD-10-CM | POA: Diagnosis not present

## 2024-08-28 MED ORDER — AMLODIPINE BESYLATE 5 MG PO TABS: 5.0000 mg | ORAL_TABLET | Freq: Every day | ORAL | 0 refills | Status: DC

## 2024-08-28 NOTE — Patient Instructions (Signed)
 Please continue your lisinopril  10mg . Take upon awakening every morning. ADD amlodipine  5mg . Take this every morning as well.  Monitor your blood pressure at home, goal 120/80. I have ordered a continuous bp cuff.  Please follow up with GI for colonoscopy given your positive cologuard.  Please follow up in one month for BP follow up and lab review.

## 2024-08-28 NOTE — Progress Notes (Unsigned)
   Established Patient Office Visit  Subjective:  Patient ID: KAARI ZEIGLER, female    DOB: 1961/07/11  Age: 63 y.o. MRN: [id number]  Chief Complaint  Patient presents with   Medication Refill    HPI  {History (Optional):23778}  ROS: as noted in HPI  Objective:     BP (!) 157/76   Pulse 77   Ht 5' 2 (1.575 m)   Wt 143 lb (64.9 kg)   SpO2 95%   BMI 26.16 kg/m  {Vitals History (Optional):23777}  Physical Exam   No results found for any visits on 08/28/24.  {Labs (Optional):23779}  The 10-year ASCVD risk score (Arnett DK, et al., 2019) is: 7.4%  Assessment & Plan:  Essential hypertension -     CMP14+EGFR -     CBC with Differential/Platelet  Dermatitis  Positive colorectal cancer screening using Cologuard test -     Ambulatory referral to Gastroenterology  Vasculitis (HCC) -     ANA+ENA+DNA/DS+Scl 70+SjoSSA/B -     Sedimentation rate -     C-reactive protein -     [facility] ( [facility]) -     CMP14+EGFR -     CBC with Differential/Platelet     No follow-ups on file.   Benton LITTIE Gave, PA

## 2024-08-29 ENCOUNTER — Encounter: Payer: Self-pay | Admitting: Urgent Care

## 2024-08-30 ENCOUNTER — Ambulatory Visit: Payer: Self-pay | Admitting: Urgent Care

## 2024-09-02 LAB — CBC WITH DIFFERENTIAL/PLATELET
Basophils Absolute: 0 x10E3/uL (ref 0.0–0.2)
Basos: 1 %
EOS (ABSOLUTE): 0.2 x10E3/uL (ref 0.0–0.4)
Eos: 5 %
Hematocrit: 37.7 % (ref 34.0–46.6)
Hemoglobin: 12.1 g/dL (ref 11.1–15.9)
Immature Grans (Abs): 0 x10E3/uL (ref 0.0–0.1)
Immature Granulocytes: 0 %
Lymphocytes Absolute: 1.2 x10E3/uL (ref 0.7–3.1)
Lymphs: 31 %
MCH: 30 pg (ref 26.6–33.0)
MCHC: 32.1 g/dL (ref 31.5–35.7)
MCV: 94 fL (ref 79–97)
Monocytes Absolute: 0.3 x10E3/uL (ref 0.1–0.9)
Monocytes: 6 %
Neutrophils Absolute: 2.2 x10E3/uL (ref 1.4–7.0)
Neutrophils: 56 %
Platelets: 253 x10E3/uL (ref 150–450)
RBC: 4.03 x10E6/uL (ref 3.77–5.28)
RDW: 13 % (ref 11.7–15.4)
WBC: 4 x10E3/uL (ref 3.4–10.8)

## 2024-09-02 LAB — CMP14+EGFR
ALT: 19 IU/L (ref 0–32)
AST: 19 IU/L (ref 0–40)
Albumin: 4.3 g/dL (ref 3.9–4.9)
Alkaline Phosphatase: 112 IU/L (ref 44–121)
BUN/Creatinine Ratio: 16 (ref 12–28)
BUN: 13 mg/dL (ref 8–27)
Bilirubin Total: 0.3 mg/dL (ref 0.0–1.2)
CO2: 21 mmol/L (ref 20–29)
Calcium: 9.4 mg/dL (ref 8.7–10.3)
Chloride: 107 mmol/L — ABNORMAL HIGH (ref 96–106)
Creatinine, Ser: 0.79 mg/dL (ref 0.57–1.00)
Globulin, Total: 2 g/dL (ref 1.5–4.5)
Glucose: 91 mg/dL (ref 70–99)
Potassium: 4 mmol/L (ref 3.5–5.2)
Sodium: 143 mmol/L (ref 134–144)
Total Protein: 6.3 g/dL (ref 6.0–8.5)
eGFR: 83 mL/min/1.73 (ref >59)

## 2024-09-02 LAB — ANA+ENA+DNA/DS+SCL 70+SJOSSA/B
ANA Titer 1: NEGATIVE
ENA RNP Ab: 0.2 AI (ref 0.0–0.9)
ENA SM Ab Ser-aCnc: <0.2 AI (ref 0.0–0.9)
ENA SSA (RO) Ab: 0.3 AI (ref 0.0–0.9)
ENA SSB (LA) Ab: <0.2 AI (ref 0.0–0.9)
Scleroderma (Scl-70) (ENA) Antibody, IgG: <0.2 AI (ref 0.0–0.9)
dsDNA Ab: 1 [IU]/mL (ref 0–9)

## 2024-09-02 LAB — ANCA TITERS
Atypical pANCA: <1:20 {titer}
C-ANCA: <1:20 {titer}
P-ANCA: <1:20 {titer}

## 2024-09-02 LAB — SEDIMENTATION RATE: Sed Rate: 13 mm/h (ref 0–40)

## 2024-09-02 LAB — C-REACTIVE PROTEIN: CRP: <1 mg/L (ref 0–10)

## 2024-09-11 ENCOUNTER — Ambulatory Visit: Admitting: Emergency Medicine

## 2024-09-15 ENCOUNTER — Other Ambulatory Visit: Payer: Self-pay

## 2024-09-15 DIAGNOSIS — C3411 Malignant neoplasm of upper lobe, right bronchus or lung: Secondary | ICD-10-CM

## 2024-09-17 ENCOUNTER — Telehealth: Payer: Self-pay | Admitting: *Deleted

## 2024-09-17 ENCOUNTER — Ambulatory Visit (HOSPITAL_COMMUNITY)

## 2024-09-17 NOTE — Telephone Encounter (Signed)
 CALLED PATIENT TO INFORM OF CT FOR 09-30-24- ARRIVAL TIME- 5:15 PM @ [facility], NO RESTRICTIONS TO SCAN, PATIENT TO RECEIVE RESULTS FROM DR. KINARD ON 10-02-24 @ 3:45 PM, LVM FOR A RETURN CALL

## 2024-09-18 ENCOUNTER — Ambulatory Visit (HOSPITAL_BASED_OUTPATIENT_CLINIC_OR_DEPARTMENT_OTHER)

## 2024-09-20 ENCOUNTER — Other Ambulatory Visit: Payer: Self-pay | Admitting: Urgent Care

## 2024-09-20 DIAGNOSIS — G43711 Chronic migraine without aura, intractable, with status migrainosus: Secondary | ICD-10-CM

## 2024-09-22 ENCOUNTER — Ambulatory Visit: Payer: Self-pay | Admitting: Radiation Oncology

## 2024-09-23 DIAGNOSIS — G894 Chronic pain syndrome: Secondary | ICD-10-CM | POA: Diagnosis not present

## 2024-09-23 DIAGNOSIS — M5481 Occipital neuralgia: Secondary | ICD-10-CM | POA: Diagnosis not present

## 2024-09-27 ENCOUNTER — Other Ambulatory Visit (HOSPITAL_BASED_OUTPATIENT_CLINIC_OR_DEPARTMENT_OTHER): Payer: Self-pay | Admitting: Nurse Practitioner

## 2024-09-30 ENCOUNTER — Ambulatory Visit (HOSPITAL_BASED_OUTPATIENT_CLINIC_OR_DEPARTMENT_OTHER)

## 2024-09-30 ENCOUNTER — Ambulatory Visit: Admitting: Urgent Care

## 2024-10-01 ENCOUNTER — Telehealth: Payer: Self-pay | Admitting: *Deleted

## 2024-10-01 NOTE — Telephone Encounter (Signed)
 CALLED PATIENT TO ASK ABOUT RESCHEDULING FU - PATIENT AGREED TO COME 10-09-24 @ 9:30 AM

## 2024-10-02 ENCOUNTER — Ambulatory Visit: Payer: Self-pay | Admitting: Radiation Oncology

## 2024-10-02 ENCOUNTER — Ambulatory Visit (HOSPITAL_COMMUNITY)
Admission: RE | Admit: 2024-10-02 | Discharge: 2024-10-02 | Disposition: A | Discharge status: Home / Self Care | Source: Ambulatory Visit | Attending: Radiology | Admitting: Radiology

## 2024-10-02 DIAGNOSIS — S2231XA Fracture of one rib, right side, initial encounter for closed fracture: Secondary | ICD-10-CM | POA: Diagnosis not present

## 2024-10-02 DIAGNOSIS — C3411 Malignant neoplasm of upper lobe, right bronchus or lung: Secondary | ICD-10-CM | POA: Insufficient documentation

## 2024-10-08 NOTE — Progress Notes (Signed)
 Radiation Oncology         [phone]) [phone] ________________________________  Name: [name] MRN: [id number]  Date: 10/09/2024  DOB: [birth year]  Follow-Up Visit Note  CC: [name], PA  Shelah Lamar RAMAN, MD  No diagnosis found.  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 She was last seen in office on 07/10/24 for a follow up visit. Patient continued to follow up with their specialists to manage their chronic conditions.   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 Scan also noted stable previously measured small pulmonary nodules.   No other significant oncologic interval history since the patient was last seen.                                Allergies:  is allergic to codeine, topamax [topiramate], and lyrica [pregabalin].  Meds: Current Outpatient Medications  Medication Sig Dispense Refill   AIMOVIG 140 MG/ML SOAJ      albuterol  (VENTOLIN  HFA) 108 (90 Base) MCG/ACT inhaler INHALE 1 TO 2 PUFFS EVERY 6 HOURS AS NEEDED FOR WHEEZING OR SHORTNESS OF BREATH. 54 g 3   amLODipine  (NORVASC ) 5 MG tablet Take 1 tablet (5 mg total) by mouth daily. 90 tablet 0   aspirin  EC 81 MG tablet Take 81 mg by mouth daily. Swallow whole.     atorvastatin  (LIPITOR) 20 MG tablet TAKE 1 TABLET BY MOUTH EVERY DAY 90 tablet 1   Biotin 1000 MCG CHEW Chew 1,000 mcg by mouth  daily.     budesonide -glycopyrrolate -formoterol  (BREZTRI  AEROSPHERE) 160-9-4.8 MCG/ACT AERO inhaler Inhale 2 puffs into the lungs 2 (two) times daily. 10.7 g 6   cyanocobalamin (VITAMIN B12) 1000 MCG tablet Take 2,000 mcg by mouth daily.     diclofenac (VOLTAREN) 75 MG EC tablet Take 75 mg by mouth 2 (two) times daily.     Erenumab-aooe (AIMOVIG, 140 MG DOSE, [place]) Inject 140 mg into the skin every 30 (thirty) days.     famotidine  (PEPCID ) 20 MG tablet Take 1 tablet (20 mg total) by mouth 2 (two) times daily as needed for heartburn or indigestion. 90 tablet 2   gabapentin  (NEURONTIN ) 100 MG capsule Take 1 capsule (100 mg total) by mouth 3 (three) times daily. 60 capsule 1   gabapentin  (NEURONTIN ) 300 MG capsule Take 300 mg by mouth at bedtime.     lamoTRIgine (LAMICTAL) 100 MG tablet Take 100 mg by mouth 2 (two) times daily.      lisinopril  (ZESTRIL ) 10 MG tablet TAKE 1 TABLET BY MOUTH EVERY DAY 90 tablet 0   mirtazapine  (REMERON ) 45 MG tablet Take 1 tablet (45 mg total) by mouth at bedtime.     ondansetron  (ZOFRAN ) 4 MG tablet Take 4 mg by mouth every 8 (eight) hours as needed for nausea or vomiting.  Oxycodone  HCl 10 MG TABS Take 10 mg by mouth every 6 (six) hours.     SUMAtriptan  (IMITREX ) 100 MG tablet Take one tab as needed for migraine. May repeat in 2 hours if headache persists or recurs. Do not exceed 2 tabs in 24 hours 12 tablet 2   tiZANidine  (ZANAFLEX ) 4 MG tablet Take 4 mg by mouth 3 (three) times daily.     triamcinolone  cream (KENALOG ) 0.5 % Apply 1 Application topically 2 (two) times daily. To affected areas.Do not exceed 14 days consecutively. 30 g 3   No current facility-administered medications for this visit.    Physical Findings: The patient is in no acute distress. Patient is alert and oriented.  vitals were not taken for this visit. .  No significant changes. Lungs are clear to auscultation bilaterally. Heart has regular rate and rhythm. No palpable cervical,  supraclavicular, or axillary adenopathy. Abdomen soft, non-tender, normal bowel sounds.   Lab Findings: Lab Results  Component Value Date   WBC 4.0 08/28/2024   HGB 12.1 08/28/2024   HCT 37.7 08/28/2024   MCV 94 08/28/2024   PLT 253 08/28/2024    Radiographic Findings: CT CHEST WO CONTRAST Result Date: 10/03/2024 EXAM: CT CHEST WITHOUT CONTRAST 10/02/2024 05:25:07 PM TECHNIQUE: CT of the chest was performed without the administration of intravenous contrast. Multiplanar reformatted images are provided for review. Automated exposure control, iterative reconstruction, and/or weight based adjustment of the mA/kV was utilized to reduce the radiation dose to as low as reasonably achievable. COMPARISON: 03/11/2024 CLINICAL HISTORY: Non-small cell lung cancer (NSCLC), monitor. Malignant neoplasm of right upper lobe of lung. FINDINGS: MEDIASTINUM: Heart and pericardium are unremarkable. Coronary artery calcifications are present. Aortic atherosclerotic changes are present. The central airways are clear. LYMPH NODES: No mediastinal, hilar or axillary lymphadenopathy. LUNGS AND PLEURA: No focal consolidation or pulmonary edema. No pleural effusion or pneumothorax. Band-like area of fibrosis, scarring, and architectural distortion noted within the periphery of the right upper lobe in the distribution of the fiducial markers. This is unchanged from the prior exam, compatible with external beam radiation. Previously measured index nodules include: -right upper lobe nodule measures 3 mm, image 54/5, stable from previous exam. - the nodule posterior to the fiducial markers measures 6 mm, image 57/5. Unchanged from previous exam. - slightly more inferior and posterior to the fiducial markers is a 4 mm nodule, image 58/5. Previously this measured 4 mm. -3 mm nodule within the anterior left lower lobe is unchanged, image 109/4. SOFT TISSUES/BONES: Right lateral fourth rib fracture is identified overlying the area of  external beam radiation in the right upper lobe, image 54/55. New from previous exam and possibly reflecting sequelae of osteonecrosis secondary to external beam radiation. There are no signs of bone metastasis. Status post ACDF (Anterior Cervical Discectomy and Fusion) within the cervical spine. No acute abnormality of the remaining visualized bones or soft tissues. UPPER ABDOMEN: Limited images of the upper abdomen demonstrates no acute abnormality. IMPRESSION: 1. Stable post-radiation band-like fibrosis and architectural distortion in the right upper lobe. 2. Stable previously measured small pulmonary nodules. 3. No signs of progressive disease. 4. 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 No signs of bone metastasis. 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  ***  Plan:  ***   *** minutes of total time was spent for this patient encounter, including preparation, face-to-face counseling with the patient and coordination of care, physical exam, and documentation of the encounter. ____________________________________  Lynwood CHARM Nasuti, PhD, MD  This document serves as a record of services personally performed by Lynwood Nasuti, MD. It was created on his behalf by Reymundo Cartwright, a trained medical scribe. The creation of this record is based on the scribe's personal observations and the provider's statements to them. This document has been checked and approved by the attending provider.

## 2024-10-09 ENCOUNTER — Ambulatory Visit
Admission: RE | Admit: 2024-10-09 | Discharge: 2024-10-09 | Disposition: A | Discharge status: Home / Self Care | Payer: Self-pay | Source: Ambulatory Visit | Attending: Radiation Oncology | Admitting: Radiation Oncology

## 2024-10-09 ENCOUNTER — Encounter: Payer: Self-pay | Admitting: Radiation Oncology

## 2024-10-09 VITALS — BP 122/67 | HR 88 | Temp 97.5°F | Resp 20 | Ht 62.0 in | Wt 141.6 lb

## 2024-10-09 DIAGNOSIS — S2231XA Fracture of one rib, right side, initial encounter for closed fracture: Secondary | ICD-10-CM | POA: Diagnosis not present

## 2024-10-09 DIAGNOSIS — Z7982 Long term (current) use of aspirin: Secondary | ICD-10-CM | POA: Insufficient documentation

## 2024-10-09 DIAGNOSIS — Z79899 Other long term (current) drug therapy: Secondary | ICD-10-CM | POA: Diagnosis not present

## 2024-10-09 DIAGNOSIS — C3411 Malignant neoplasm of upper lobe, right bronchus or lung: Secondary | ICD-10-CM | POA: Diagnosis not present

## 2024-10-09 DIAGNOSIS — Z791 Long term (current) use of non-steroidal anti-inflammatories (NSAID): Secondary | ICD-10-CM | POA: Insufficient documentation

## 2024-10-09 DIAGNOSIS — Z923 Personal history of irradiation: Secondary | ICD-10-CM | POA: Insufficient documentation

## 2024-10-09 DIAGNOSIS — F17210 Nicotine dependence, cigarettes, uncomplicated: Secondary | ICD-10-CM | POA: Diagnosis not present

## 2024-10-09 NOTE — Progress Notes (Signed)
 [name] is here today for follow up post radiation to the lung.  Lung Side: Right, patient completed treatment on 08/20/23.  Does the patient complain of any of the following: Pain: Reports pain to mid and lower back at times.  Shortness of breath w/wo exertion: yes, patient reports difficulty breathing when she lays down.  Cough: Yes, productive Hemoptysis: No Pain with swallowing: No Swallowing/choking concerns: no Appetite: Good  Energy Level: low Post radiation skin Changes: no    Additional comments if applicable:    BP 122/67 (BP Location: Right Arm, Patient Position: Sitting, Cuff Size: Normal)   Pulse 88   Temp (!) 97.5 F (36.4 C)   Resp 20   Ht 5' 2 (1.575 m)   Wt 141 lb 9.6 oz (64.2 kg)   SpO2 94%   BMI 25.90 kg/m

## 2024-10-20 ENCOUNTER — Encounter: Payer: Self-pay | Admitting: Primary Care

## 2024-10-20 ENCOUNTER — Telehealth: Payer: Self-pay | Admitting: Primary Care

## 2024-10-20 ENCOUNTER — Ambulatory Visit: Admitting: Primary Care

## 2024-10-20 VITALS — BP 120/64 | HR 89 | Temp 97.4°F | Ht 61.0 in | Wt 142.4 lb

## 2024-10-20 DIAGNOSIS — J439 Emphysema, unspecified: Secondary | ICD-10-CM

## 2024-10-20 DIAGNOSIS — C3491 Malignant neoplasm of unspecified part of right bronchus or lung: Secondary | ICD-10-CM | POA: Diagnosis not present

## 2024-10-20 DIAGNOSIS — I10 Essential (primary) hypertension: Secondary | ICD-10-CM

## 2024-10-20 DIAGNOSIS — Z23 Encounter for immunization: Secondary | ICD-10-CM | POA: Diagnosis not present

## 2024-10-20 DIAGNOSIS — J4489 Other specified chronic obstructive pulmonary disease: Secondary | ICD-10-CM | POA: Diagnosis not present

## 2024-10-20 NOTE — Telephone Encounter (Signed)
 I have resent the order as Urgent to [facility].

## 2024-10-20 NOTE — Telephone Encounter (Signed)
 Order has been received by [facility]

## 2024-10-20 NOTE — Patient Instructions (Addendum)
  VISIT SUMMARY: Today, you were seen for breathing difficulties and wheezing. You have a history of COPD and non-small cell lung cancer, and you recently completed radiation therapy. A recent CT scan showed stable pulmonary nodules and no disease progression. We discussed your current symptoms and reviewed your treatment plan.  YOUR PLAN: -CHRONIC OBSTRUCTIVE PULMONARY DISEASE (COPD) WITH RECURRENT EXACERBATIONS AND EMPHYSEMA: COPD is a chronic lung disease that makes it hard to breathe. You will continue taking Breztri  twice daily and use the albuterol  nebulizer morning and evening. We will refill your nebulizer solution. We discussed the potential use of Ohtuvayre  or Daliresp to reduce exacerbations and inflammation, and we will explore your insurance coverage for these medications. A breathing test will be ordered to compare your lung function from 2021, and lab tests will be done to check for inflammation. If your symptoms persist, we may consider a short course of prednisone . You can use Mucinex or Robitussin for your cough, and we discussed using an albuterol  inhaler as a portable option for emergencies.  -RADIATION-INDUCED PULMONARY FIBROSIS, RIGHT UPPER LOBE, WITH STABLE PULMONARY NODULES AND HISTORY OF NON-SMALL CELL LUNG CANCER: Radiation-induced pulmonary fibrosis is scarring of the lung tissue caused by radiation therapy. Your recent CT scan showed stable pulmonary nodules and no disease progression. If your symptoms worsen, we may consider using prednisone .  INSTRUCTIONS: Please follow up with Doctor Byrum as scheduled. We will also send a message to the pharmacy team to explore coverage for Ohtuvayre  or Daliresp. Additionally, we will order a breathing test and lab work to check for inflammatory markers and T2 inflammation (FeNO).  Order: FENO - if >25 provider know will need additional labs and prednisone  High dose flu   Follow-up First available with Dr. Shelah in the next 6-12 weeks,  1 hour PFT prior

## 2024-10-20 NOTE — Telephone Encounter (Signed)
 Ordered for ONO on Room air in May 2025 which has not been completed, can we check on this

## 2024-10-20 NOTE — Progress Notes (Signed)
 @Patient  ID: [name], female    DOB: 11/10/1961, 63 y.o.   MRN: [id number]  No chief complaint on file.   Referring provider: [name], PA  HPI: 63 year old female, former smoker. PMH COPD, non-small lung cancer right upper lobe, osteoarthritis. Patient of Dr. Neysa.   Previous [facility] pulmonary encounter: 05/13/2024 Discussed the use of AI scribe software for clinical note transcription with the patient, who gave verbal consent to proceed.  History of Present Illness   [name] is a 62 year old female with COPD and non-small cell lung cancer who presents for follow-up of her respiratory conditions.  She has a history of COPD and non-small cell lung cancer in the right upper lobe, diagnosed after a bronchoscopy in June of the previous year. She underwent radiation therapy and has since quit smoking. Her last CT scan in March showed mild emphysema, some scarring, and a stable hypermetabolic nodule in the right upper lobe, unchanged since December 2024. She is scheduled for another CT scan before her follow-up in September.  For her COPD, she is currently using Breztri  and albuterol . She experiences nocturnal shortness of breath but no symptoms of sleep apnea such as snoring or waking up gasping. ONO in November 20024 indicated the need for nocturnal oxygen but the test is outdated and needs to be repeated. She spent about two hours with an oxygen level below 88% during the night.  She has a cough with clear, stringy mucus. She is currently on her last refill of Breztri  and has a flutter valve, which she acknowledges she should use more regularly.  She is currently on Augmentin  for cellulitis on her leg, which developed after a cat scratch. Her leg has been swelling and slow to heal. She has taken multiple courses of antibiotics for this condition.     1. COPD with acute exacerbation (HCC) (Primary) - Pulse oximetry, overnight; Future - budesonide -glycopyrrolate -formoterol   (BREZTRI  AEROSPHERE) 160-9-4.8 MCG/ACT AERO inhaler; Inhale 2 puffs into the lungs 2 (two) times daily.  Dispense: 10.7 g; Refill: 6  2. Immunization due - RSV,Recombinant PF (Arexvy )  Assessment and Plan    Chronic obstructive pulmonary disease (COPD) COPD with wheezing and stringy, clear mucus production. No fevers or purulent sputum. Mild bronchial thickening on CT scan. Current medications include Breztri  and albuterol . Reports shortness of breath but no symptoms of sleep apnea. Previous overnight oximetry indicated nocturnal hypoxemia with oxygen levels dropping below 88% for approximately two hours. 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 - Repeat overnight oximetry to confirm need for nocturnal oxygen - Initiate nocturnal oxygen therapy based on oximetry results  Non-small cell lung cancer, right upper lobe Non-small cell lung cancer in the right upper lobe, status post radiation therapy. The right upper lobe nodule has been well-managed since December 2024 with no changes on the March 2025 CT scan. Follow-up CT scan scheduled before September 2025.  Emphysema Mild emphysema on CT scan.  No acute changes or exacerbations.  Cellulitis of leg Cellulitis of the leg, likely secondary to a cat scratch. Currently on Augmentin . Reports persistent swelling and difficulty healing. - Advise leg  elevation to improve blood flow and aid healing - Recommend compression stockings or ace wrap during the day to reduce swelling  10/20/2024- Interim hx  Discussed the use of AI scribe software for clinical note transcription with the patient, who gave verbal consent to proceed. History of Present Illness [name] is a 63 year old female with COPD and non-small cell lung cancer who presents with breathing difficulties and wheezing.  She has a history of COPD and non-small cell lung cancer, having completed radiation therapy in November 2024. A recent CT scan in October 2025 showed post-radiation changes in the right upper lung, stable small pulmonary nodules, and no evidence of disease progression.  She experiences wheezing and uses a nebulizer as needed for relief. She is currently on Breztri , taken twice daily, and uses albuterol  as needed for wheezing or shortness of breath. She follows the proper technique for using the inhaler, including shaking it, taking a puff, holding her breath, and rinsing her mouth afterward.  In May 2025, she experienced a flare-up of bronchitis that exacerbated her COPD, treated with Augmentin  and prednisone , which alleviated her symptoms. She has a history of insomnia and prefers to avoid long-term prednisone  use due to side effects such as hyperactivity and difficulty sleeping.  She has a cough with clear mucus, managed with Mucinex, and no difficulty in expectorating the mucus. She has not been on oxygen therapy. She experiences shortness of breath, particularly at night, and props herself up to breathe more easily.  Her past medical history includes moderate obstructive lung disease with a lung function of 64% predicted in 2021, prior to radiation therapy. She is a former smoker, which contributes to her COPD. She has not had a breathing test since 2021.  She is not currently using oxygen. ONO testing in November 2024 which showed patient spent 2 hours 22 min  with O2 <88% on room air. Ordered for repeat ONO back in May has not been completed.  Allergies  Allergen Reactions   Codeine Nausea Only   Topamax [Topiramate] Hives   Lyrica [Pregabalin] Swelling    Immunization History  Administered Date(s) Administered   Influenza, Seasonal, Injecte, Preservative Fre 10/23/2023   Influenza,inj,Quad PF,6+ Mos 11/29/2015, 02/09/2020, 01/09/2022   PFIZER(Purple Top)SARS-COV-2 Vaccination 08/11/2020   Pneumococcal Polysaccharide-23 12/29/2020, 01/09/2022   Respiratory Syncytial Virus Vaccine ,Recomb Aduvanted(Arexvy ) 05/13/2024   Tdap 10/23/2012, 02/25/2024    Past Medical History:  Diagnosis Date   Allergy    codeine,topamax,lyrica   Anxiety    Arthritis    Cancer of cervix (HCC)    Cataract    Cervical spondylosis without myelopathy 08/04/2013   COPD (chronic obstructive pulmonary disease) (HCC)    Coronary artery calcification    DDD (degenerative disc disease), lumbar    Depression    Dyspnea    on exertion   Emphysema of lung (HCC)    Fibromyalgia    GERD (gastroesophageal reflux disease)    History of radiation therapy    Right lung- 08/13/23-08/20/23- Dr. Lynwood Nasuti   Migraine without aura, with intractable migraine, so stated, without mention of status migrainosus 12/2020   Osteoporosis    Scoliosis     Tobacco History: Social History   Tobacco Use  Smoking Status Former   Current packs/day: 0.00   Average packs/day: 1 pack/day  for 49.0 years (49.0 ttl pk-yrs)   Types: Cigarettes   Quit date: 08/09/2023   Years since quitting: 1.2  Smokeless Tobacco Never   Counseling given: Not Answered   Outpatient Medications Prior to Visit  Medication Sig Dispense Refill   AIMOVIG 140 MG/ML SOAJ      albuterol  (VENTOLIN  HFA) 108 (90 Base) MCG/ACT inhaler INHALE 1 TO 2 PUFFS EVERY 6 HOURS AS NEEDED FOR WHEEZING OR SHORTNESS OF BREATH. 54 g 3   amLODipine  (NORVASC ) 5 MG tablet Take 1 tablet (5 mg total) by mouth daily. 90  tablet 0   aspirin  EC 81 MG tablet Take 81 mg by mouth daily. Swallow whole.     atorvastatin  (LIPITOR) 20 MG tablet TAKE 1 TABLET BY MOUTH EVERY DAY 90 tablet 1   Biotin 1000 MCG CHEW Chew 1,000 mcg by mouth daily.     budesonide -glycopyrrolate -formoterol  (BREZTRI  AEROSPHERE) 160-9-4.8 MCG/ACT AERO inhaler Inhale 2 puffs into the lungs 2 (two) times daily. 10.7 g 6   cyanocobalamin (VITAMIN B12) 1000 MCG tablet Take 2,000 mcg by mouth daily.     diclofenac (VOLTAREN) 75 MG EC tablet Take 75 mg by mouth 2 (two) times daily.     Erenumab-aooe (AIMOVIG, 140 MG DOSE, Magazine) Inject 140 mg into the skin every 30 (thirty) days.     famotidine  (PEPCID ) 20 MG tablet Take 1 tablet (20 mg total) by mouth 2 (two) times daily as needed for heartburn or indigestion. 90 tablet 2   gabapentin  (NEURONTIN ) 100 MG capsule Take 1 capsule (100 mg total) by mouth 3 (three) times daily. 60 capsule 1   gabapentin  (NEURONTIN ) 300 MG capsule Take 300 mg by mouth at bedtime.     lamoTRIgine (LAMICTAL) 100 MG tablet Take 100 mg by mouth 2 (two) times daily.      lisinopril  (ZESTRIL ) 10 MG tablet TAKE 1 TABLET BY MOUTH EVERY DAY 90 tablet 0   mirtazapine  (REMERON ) 45 MG tablet Take 1 tablet (45 mg total) by mouth at bedtime.     ondansetron  (ZOFRAN ) 4 MG tablet Take 4 mg by mouth every 8 (eight) hours as needed for nausea or vomiting.     Oxycodone  HCl 10 MG TABS Take 10 mg by mouth every 6 (six) hours.     SUMAtriptan  (IMITREX ) 100 MG tablet Take one tab as needed for migraine. May repeat in 2 hours if headache persists or recurs. Do not exceed 2 tabs in 24 hours 12 tablet 2   tiZANidine  (ZANAFLEX ) 4 MG tablet Take 4 mg by mouth 3 (three) times daily.     triamcinolone  cream (KENALOG ) 0.5 % Apply 1 Application topically 2 (two) times daily. To affected areas.Do not exceed 14 days consecutively. 30 g 3   No facility-administered medications prior to visit.   Review of Systems  Review of Systems  Constitutional: Negative.    HENT: Negative.    Respiratory:  Positive for cough and shortness of breath.   Cardiovascular: Negative.      Physical Exam  There were no vitals taken for this visit. Physical Exam Constitutional:      General: She is not in acute distress.    Appearance: Normal appearance. She is not ill-appearing.  HENT:     Head: Normocephalic and atraumatic.  Cardiovascular:     Rate and Rhythm: Normal rate and regular rhythm.  Pulmonary:     Effort: Pulmonary effort is normal.     Breath sounds: Wheezing present.     Comments: Diffuse wheezing  t/o Musculoskeletal:        General: Normal range of motion.  Skin:    General: Skin is warm and dry.  Neurological:     General: No focal deficit present.     Mental Status: She is alert and oriented to person, place, and time. Mental status is at baseline.  Psychiatric:        Mood and Affect: Mood normal.        Behavior: Behavior normal.        Thought Content: Thought content normal.        Judgment: Judgment normal.     CBC    Component Value Date/Time   WBC 4.0 08/28/2024 1512   WBC 5.9 02/18/2024 1153   RBC 4.03 08/28/2024 1512   RBC 3.99 02/18/2024 1153   HGB 12.1 08/28/2024 1512   HCT 37.7 08/28/2024 1512   PLT 253 08/28/2024 1512   MCV 94 08/28/2024 1512   MCH 30.0 08/28/2024 1512   MCH 31.2 06/19/2023 0923   MCHC 32.1 08/28/2024 1512   MCHC 33.0 02/18/2024 1153   RDW 13.0 08/28/2024 1512   LYMPHSABS 1.2 08/28/2024 1512   MONOABS 0.4 02/18/2024 1153   EOSABS 0.2 08/28/2024 1512   BASOSABS 0.0 08/28/2024 1512    BMET    Component Value Date/Time   NA 143 08/28/2024 1512   K 4.0 08/28/2024 1512   CL 107 (H) 08/28/2024 1512   CO2 21 08/28/2024 1512   GLUCOSE 91 08/28/2024 1512   GLUCOSE 89 02/18/2024 1153   BUN 13 08/28/2024 1512   CREATININE 0.79 08/28/2024 1512   CALCIUM  9.4 08/28/2024 1512   GFRNONAA >60 06/19/2023 0923   GFRAA  05/23/2009 2036    >60        The eGFR has been calculated using the MDRD  equation. This calculation has not been validated in all clinical situations. eGFR's persistently <60 mL/min signify possible Chronic Kidney Disease.    BNP No results found for: BNP  ProBNP No results found for: PROBNP  Imaging: CT CHEST WO CONTRAST Result Date: 10/03/2024 EXAM: CT CHEST WITHOUT CONTRAST 10/02/2024 05:25:07 PM TECHNIQUE: CT of the chest was performed without the administration of intravenous contrast. Multiplanar reformatted images are provided for review. Automated exposure control, iterative reconstruction, and/or weight based adjustment of the mA/kV was utilized to reduce the radiation dose to as low as reasonably achievable. COMPARISON: 03/11/2024 CLINICAL HISTORY: Non-small cell lung cancer (NSCLC), monitor. Malignant neoplasm of right upper lobe of lung. FINDINGS: MEDIASTINUM: Heart and pericardium are unremarkable. Coronary artery calcifications are present. Aortic atherosclerotic changes are present. The central airways are clear. LYMPH NODES: No mediastinal, hilar or axillary lymphadenopathy. LUNGS AND PLEURA: No focal consolidation or pulmonary edema. No pleural effusion or pneumothorax. Band-like area of fibrosis, scarring, and architectural distortion noted within the periphery of the right upper lobe in the distribution of the fiducial markers. This is unchanged from the prior exam, compatible with external beam radiation. Previously measured index nodules include: -right upper lobe nodule measures 3 mm, image 54/5, stable from previous exam. - the nodule posterior to the fiducial markers measures 6 mm, image 57/5. Unchanged from previous exam. - slightly more inferior and posterior to the fiducial markers is a 4 mm nodule, image 58/5. Previously this measured 4 mm. -3 mm nodule within the anterior left lower lobe is unchanged, image 109/4. SOFT TISSUES/BONES: Right lateral fourth rib fracture is identified overlying the area of external beam radiation in the  right upper  lobe, image 54/55. New from previous exam and possibly reflecting sequelae of osteonecrosis secondary to external beam radiation. There are no signs of bone metastasis. Status post ACDF (Anterior Cervical Discectomy and Fusion) within the cervical spine. No acute abnormality of the remaining visualized bones or soft tissues. UPPER ABDOMEN: Limited images of the upper abdomen demonstrates no acute abnormality. IMPRESSION: 1. Stable post-radiation band-like fibrosis and architectural distortion in the right upper lobe. 2. Stable previously measured small pulmonary nodules. 3. No signs of progressive disease. 4. 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 No signs of bone metastasis. Electronically signed by: Waddell Calk MD 10/03/2024 08:10 AM EDT RP Workstation: [id number]     Assessment & Plan:   1. Essential hypertension  2. COPD with chronic bronchitis and emphysema (HCC) (Primary) - Pulmonary Function Test; Future  3. Adenocarcinoma of right lung [facility]) - Pulmonary Function Test; Future   Assessment and Plan Assessment & Plan Chronic obstructive pulmonary disease (COPD) with recurrent exacerbations and emphysema Moderate obstructive lung disease with lung function at 64% predicted in 2021. She was treated for acute exacerbation in May with steroids. Reoccurring wheezing and shortness of breath today. Current treatment includes Breztri  twice daily.  - Continue Breztri  Aerosphere two puffs twice daily. - Use albuterol  nebulizer morning and evening x 2 weeks then every six hours as needed for wheezing or shortness of breath. - Refill nebulizer solution for albuterol . - Discussed potential use of Ohtuvayre  or Daliresp, both PDE4 inhibitors, to reduce exacerbations and inflammation. She prefers to avoid prednisone  due to side effects. Discussed potential side effects of Ohtuvayre , including  back pain, hypertension, urinary tract infection, diarrhea, and mental health issues. Explore cost and insurance coverage for Ohtuvayre  and Daliresp. - Send message to pharmacy team to explore coverage for Otuver or Daliresp. - Order breathing test to compare lung function from 2021. - Patient has unable to complete (FeNO) testing today due to physical limitations. - Use Mucinex or Robitussin for as needed to loosen chest congestion/ cough. - Discussed the use of albuterol  inhaler as a portable option for emergencies. - Prednisone  taper as prescribed 40mg  x 2 days, 30mg  x 2 days, 20mg  x 2 days, 10mg  x 2 days  - Schedule follow-up with Doctor Byrum after PFTs, consider addition of PDE4 inhibitor after benefits investigation vs daily low dose prednisone    Radiation-induced pulmonary fibrosis, right upper lobe, with stable pulmonary nodules and history of non-small cell lung cancer Radiation-induced pulmonary fibrosis in the right upper lobe with stable pulmonary nodules. CT scan in October showed post-radiation changes, stable small pulmonary nodules, and no evidence of disease progression. Fibrosis has not worsened in the last six months. Completed radiation in November 2024. Fibrosis may contribute to wheezing and shortness of breath. - Consider prednisone  for fibrosis if symptoms worsen.  Recording duration: 28 minutes  I personally spent a total of 40 minutes in the care of the patient today including counseling and educating, placing orders, documenting clinical information in the EHR, independently interpreting results, communicating results, and coordinating care.   Almarie LELON Ferrari, NP 10/20/2024

## 2024-10-20 NOTE — Telephone Encounter (Signed)
 Can we explore coverage for either daliresp or Ohtuvayre  Moderate COPD with recurrent exacerbations

## 2024-10-21 ENCOUNTER — Other Ambulatory Visit (HOSPITAL_COMMUNITY): Payer: Self-pay

## 2024-10-21 ENCOUNTER — Telehealth: Payer: Self-pay

## 2024-10-21 MED ORDER — PREDNISONE 10 MG PO TABS
ORAL_TABLET | ORAL | 0 refills | Status: AC
Start: 2024-10-21 — End: 2024-10-29

## 2024-10-21 MED ORDER — ROFLUMILAST 250 MCG PO TABS: 1.0000 | ORAL_TABLET | Freq: Every day | ORAL | 0 refills | Status: DC

## 2024-10-21 MED ORDER — ALBUTEROL SULFATE (2.5 MG/3ML) 0.083% IN NEBU: 2.5000 mg | INHALATION_SOLUTION | Freq: Four times a day (QID) | RESPIRATORY_TRACT | 2 refills | Status: AC | PRN

## 2024-10-21 NOTE — Telephone Encounter (Signed)
*  Pulm  Pharmacy Patient Advocate Encounter   Received notification from Pt Calls Messages that prior authorization for Roflumilast 250MCG tablets  is required/requested.   Insurance verification completed.   The patient is insured through [facility].   Per test claim: PA required; PA submitted to above mentioned insurance via Latent Key/confirmation #/EOC [id number] Status is pending

## 2024-10-21 NOTE — Addendum Note (Signed)
 Addended by: HOPE ALMARIE ORN on: 10/21/2024 09:18 AM   Modules accepted: Orders

## 2024-10-21 NOTE — Telephone Encounter (Signed)
 She had an acute exacerbation of her COPD in May and now in October requiring prednisone . I have sent in script for Daliresp 250mcg daily

## 2024-10-21 NOTE — Telephone Encounter (Signed)
 Your request has been approved Request Reference Number: [id number]. ROFLUMILAST TAB 250MCG is approved through 12/24/2025. Your patient may now fill this prescription and it will be covered. Authorization Expiration12/31/2026

## 2024-11-05 ENCOUNTER — Encounter: Payer: Self-pay | Admitting: Primary Care

## 2024-11-10 NOTE — Telephone Encounter (Signed)
 I sent a message over to [employer] inregards to ONO

## 2024-11-10 NOTE — Telephone Encounter (Signed)
 Copied from CRM #[id number]. Topic: Clinical - Lab/Test Results >> Nov 10, 2024 12:07 PM Nathanel DEL wrote: Reason for CRM: pt calling for results of overnite ox test.  Pt would like to know if someone could just call her on the phone and go ver results?  Did did schedule video visit w/ Lauraine on Wed 11/19, but states f you could call her, that would be great. Please let her know today, and whether that is waiting too long.  She said last time (in June) she waited too long for result follow up.  Called and spoke with the pt. Pt states ONO was done in October or November.  [facility] do we have these results?

## 2024-11-11 NOTE — Telephone Encounter (Signed)
 I have received the ono results. I have sent it over to Order Level Scanning

## 2024-11-12 ENCOUNTER — Telehealth: Admitting: Acute Care

## 2024-11-12 ENCOUNTER — Telehealth: Payer: Self-pay

## 2024-11-12 ENCOUNTER — Encounter: Payer: Self-pay | Admitting: Acute Care

## 2024-11-12 VITALS — Ht 62.0 in | Wt 145.0 lb

## 2024-11-12 DIAGNOSIS — G4734 Idiopathic sleep related nonobstructive alveolar hypoventilation: Secondary | ICD-10-CM | POA: Diagnosis not present

## 2024-11-12 DIAGNOSIS — J449 Chronic obstructive pulmonary disease, unspecified: Secondary | ICD-10-CM | POA: Diagnosis not present

## 2024-11-12 DIAGNOSIS — Z85118 Personal history of other malignant neoplasm of bronchus and lung: Secondary | ICD-10-CM

## 2024-11-12 NOTE — Progress Notes (Signed)
 Virtual Visit via Video Note  I connected with [name] on 11/12/24 at  9:30 AM EST by a video enabled telemedicine application and verified that I am speaking with the correct person using two identifiers.  Location: Patient:  At home  Provider: [address], [place], [place], [address]    I discussed the limitations of evaluation and management by telemedicine and the availability of in person appointments. The patient expressed understanding and agreed to proceed.  History of Present Illness: Former smoker PMH COPD, non-small lung cancer right upper lobe, osteoarthritis. Patient of Dr. Neysa.   Pt. presents for follow-up for review  of her overnight oximetry results.  We have reviewed the overnight oximetry results.  She dropped her oxygen saturations below 88% for 3 minutes and 14 seconds, saturations were below 89% for 14 minutes and 56 seconds.  This qualifies the patient for group 1 nocturnal desaturations and therefore nocturnal oxygen therapy.  We have ordered nocturnal oxygen at 2 L nasal cannula.  Patient understands she will get a call from the DME regarding delivery.  Patient had no further questions at completion of the call.She has follow up scheduled with Dr. Shelah 12/2024.   Observations/Objective:      Assessment and Plan: Nocturnal oxygen desaturations Qualified for group 1 for nocturnal oxygen therapy Plan You qualified for nocturnal oxygen We have placed an order for oxygen at 2 L [place] at bedtime. Wear every night , or whenever you sleep You will get a call from DME to arrange for delivery. Call if you need us .  Follow Up Instructions: Follow up 01/21/2025 with Dr. Shelah as is scheduled   I discussed the assessment and treatment plan with the patient. The patient was provided an opportunity to ask questions and all were answered. The patient agreed with the plan and demonstrated an understanding of the instructions.   The patient was advised to call  back or seek an in-person evaluation if the symptoms worsen or if the condition fails to improve as anticipated.  I spent 15 minutes dedicated to the care of this patient on the date of this encounter to include pre-visit review of records,video  face-to-face time with the patient discussing conditions above, post visit ordering of testing, clinical documentation with the electronic health record, making appropriate referrals as documented, and communicating necessary information to the patient's healthcare team.   Lauraine JULIANNA Lites, NP

## 2024-11-12 NOTE — Patient Instructions (Addendum)
 It was good to see you today. You qualified for nocturnal oxygen We have placed an order for oxygen at 2 L [place] at bedtime. Wear every night , or whenever you sleep You will get a call from DME to arrange for delivery. Follow up 01/21/2025 with Dr. Shelah as is scheduled Call if you need us . Please contact office for sooner follow up if symptoms do not improve or worsen or seek emergency care

## 2024-11-12 NOTE — Telephone Encounter (Signed)
 ATCx1 to get set up for mychart video visit LMTCB -called twice phone went straight to voice mail

## 2024-11-17 ENCOUNTER — Other Ambulatory Visit: Payer: Self-pay | Admitting: Primary Care

## 2024-11-20 ENCOUNTER — Other Ambulatory Visit: Payer: Self-pay | Admitting: Urgent Care

## 2024-11-25 ENCOUNTER — Other Ambulatory Visit: Payer: Self-pay | Admitting: Urgent Care

## 2024-11-25 DIAGNOSIS — I10 Essential (primary) hypertension: Secondary | ICD-10-CM

## 2024-12-06 ENCOUNTER — Other Ambulatory Visit: Payer: Self-pay | Admitting: Primary Care

## 2024-12-08 ENCOUNTER — Other Ambulatory Visit: Payer: Self-pay | Admitting: Urgent Care

## 2024-12-08 DIAGNOSIS — I10 Essential (primary) hypertension: Secondary | ICD-10-CM

## 2024-12-08 NOTE — Telephone Encounter (Unsigned)
 Copied from CRM #[id number]. Topic: Clinical - Medication Refill >> Dec 08, 2024  2:34 PM Nathanel BROCKS wrote: Medication: lisinopril  (ZESTRIL ) 10 MG tablet  Has the patient contacted their pharmacy? Yes   This is the patient's preferred pharmacy:  [facility] [address] [place] - [address] [place] [address] Phone: [phone] Fax: [phone]  Is this the correct pharmacy for this prescription? Yes If no, delete pharmacy and type the correct one.   Has the prescription been filled recently? Yes  Is the patient out of the medication? Yes  Has the patient been seen for an appointment in the last year OR does the patient have an upcoming appointment? Yes  Can we respond through MyChart? No  Agent: Please be advised that Rx refills may take up to 3 business days. We ask that you follow-up with your pharmacy.

## 2024-12-09 MED ORDER — LISINOPRIL 10 MG PO TABS: 10.0000 mg | ORAL_TABLET | Freq: Every day | ORAL | 0 refills | Status: AC

## 2024-12-20 ENCOUNTER — Other Ambulatory Visit: Payer: Self-pay | Admitting: Urgent Care

## 2024-12-20 DIAGNOSIS — I10 Essential (primary) hypertension: Secondary | ICD-10-CM

## 2024-12-30 ENCOUNTER — Ambulatory Visit: Payer: Self-pay | Admitting: Acute Care

## 2024-12-30 NOTE — Telephone Encounter (Signed)
 FYI Only or Action Required?: Action required by provider: requesting zpak and to wear oxygen during the day.  Patient was last seen in primary care on 08/28/2024 by [name], PA.  Called Nurse Triage reporting Cough.  Symptoms began several days ago.  Interventions attempted: Prescription medications: albuterol , breztri .  Symptoms are: gradually worsening.  Triage Disposition: See HCP Within 4 Hours (Or PCP Triage)  Patient/caregiver understands and will follow disposition?: No, wishes to speak with PCP  E2C2 Pulmonary Triage - Initial Assessment Questions Chief Complaint (e.g., cough, sob, wheezing, fever, chills, sweat or additional symptoms) *Go to specific symptom protocol after initial questions. Cough, difficulty breathing more than normal  How long have symptoms been present? 2 days  Have you tested for COVID or Flu? Note: If not, ask patient if a home test can be taken. If so, instruct patient to call back for positive results. No  MEDICINES:   Have you used any OTC meds to help with symptoms? tylenol   Have you used your inhalers/maintenance medication? Yes If yes, What medications? Albuterol  and Breztri , and neb PRN  OXYGEN: Do you wear supplemental oxygen? Yes If yes, How many liters are you supposed to use? 2 only at night  Do you monitor your oxygen levels? No   SPO2: could not get device to work.  Copied from CRM [id number]. Topic: Clinical - Red Word Triage >> Dec 30, 2024  2:34 PM Dedra B wrote: Red Word that prompted transfer to Nurse Triage: Patient said she is experiencing lung pain, wheezing, productive cough, sore throat, and a headache that won't go away. Warm transfer to NT. Reason for Disposition  Wheezing is present  Answer Assessment - Initial Assessment Questions 1. ONSET: When did the cough begin?      2 days 3. SPUTUM: Describe the color of your sputum (e.g., none, dry cough; clear, white, yellow, green)      clear 4. HEMOPTYSIS: Are you coughing up any blood? If Yes, ask: How much? (e.g., flecks, streaks, tablespoons, etc.)     denies 5. DIFFICULTY BREATHING: Are you having difficulty breathing? If Yes, ask: How bad is it? (e.g., mild, moderate, severe)      States yes mild worse than normal 6. FEVER: Do you have a fever? If Yes, ask: What is your temperature, how was it measured, and when did it start?     99.9 10. OTHER SYMPTOMS: Do you have any other symptoms? (e.g., runny nose, wheezing, chest pain)       Wheezing  Pt states that her oxygen is prescribed for noc, 2 LPM, but states that she has been wearing it intermittently throughout the day. Pt states that she would like a zpak ordered and to know if it is okay that she wear her oxygen during the day as well, pt states that she was told not during the day by the man that delivered the oxygen and set it up  Protocols used: Cough - Acute Productive-A-AH

## 2024-12-31 ENCOUNTER — Telehealth: Payer: Self-pay | Admitting: Pulmonary Disease

## 2024-12-31 NOTE — Telephone Encounter (Signed)
 Dr. Pawar, Patient is requesting a zpack for cough with green mucus.  Having wheezing.  Had had the cough for several days.  She wants to know if she can wear her oxygen while she is sick.   She does not monitor her oxygen levels at home so she does not know what her O2 leverl is at this time.  Oxygen is ordered at HS only after ONO.  Please advise. Thank you.

## 2024-12-31 NOTE — Telephone Encounter (Addendum)
 FYI Only or Action Required?: Action required by provider: update on patient condition and Pt wanting to know if zpack can be sent in and if she can wear her O2 during the day intermittently while she has current upper respiratory symptoms.  Patient is followed in Pulmonology for COP, lung cancer, last seen on 11/12/2024 by Ruthell Lauraine FALCON, NP.  Called Nurse Triage reporting Cough.  Symptoms began several days ago.  Interventions attempted: Prescription medications: albuterol , breztri .SABRA  Symptoms are: unchanged.  Triage Disposition: See HCP Within 4 Hours (Or PCP Triage)  Patient/caregiver understands and will follow disposition?: No, wishes to speak with PCP    Pt calling back as she has not heard back from the office yesterday after completing triage. Advised to be sen that same day, declined and pt just wanted to know if a z-pack could be prescribed. Symptoms unchanged aside from mucus is now more green.  Also wanting to know if it is okay that she wear her oxygen during the day intermittently while she has these upper respiratory symptoms, pt states that she was told not during the day by the man that delivered the oxygen and set it up. Currently on 2L at night.   Forwarding request to office, confirmed preferred pharmacy is: [id number] - [place], [place] - [address]   Lucien Leila MATSU   12/31/2024 11:19 AM  Type: Phone Message  Patient [phone] states called yesterday and spoke with nurse on getting antibiotics for bronchitis. Patient stats has not heard back from the office nor the pharmacy. Patient states still having symptoms: lung pain, wheezing, shortness of breath (COPD), sore throat, headaches, but now coughing cloudy phlegm and taste the green phlegm. Please advise.    [id number] [place], [place] - [address] [place]  [address] Phone: [phone] Fax: [phone]

## 2024-12-31 NOTE — Telephone Encounter (Signed)
 Symptoms sound viral. Recommend COVID/flu testing and evaluation at urgent care, especially if having lung pain as she will need a CXR.  She did not need daytime oxygen at prior OV. Goal is >88-90%. If she's having new onset low oxygen levels, needs to go to the ED. Thanks.

## 2024-12-31 NOTE — Telephone Encounter (Signed)
 I called and spoke with patient, advised of recommendations per [facility]. I let her know to call our office if she gets a positive result of either flu or covid that we have an on call provider.   She verbalized understanding.  Nothing further needed.

## 2025-01-01 ENCOUNTER — Telehealth: Payer: Self-pay

## 2025-01-01 NOTE — Telephone Encounter (Signed)
 Copied from CRM [id number]. Topic: Clinical - Medical Advice >> Dec 31, 2024  4:51 PM Devaughn RAMAN wrote: Reason for CRM: Pt calling stating she has a cough and she is requesting an antibiotic/zpak. Please f/u with pt regarding this.  Has been handled

## 2025-01-02 ENCOUNTER — Telehealth: Payer: Self-pay

## 2025-01-02 ENCOUNTER — Ambulatory Visit: Payer: Self-pay | Admitting: Primary Care

## 2025-01-02 MED ORDER — AZITHROMYCIN 250 MG PO TABS: ORAL_TABLET | ORAL | 0 refills | Status: DC

## 2025-01-02 MED ORDER — ROFLUMILAST 500 MCG PO TABS: 500.0000 ug | ORAL_TABLET | Freq: Every day | ORAL | 11 refills | Status: AC

## 2025-01-02 NOTE — Telephone Encounter (Signed)
 FYI Only or Action Required?: Action required by provider: Declines appt. Wanting to know if abx can be sent in for her symptoms. .  Patient is followed in Pulmonology for COPD, last seen on 11/12/2024 by Ruthell Lauraine FALCON, NP.  Called Nurse Triage reporting Cough and Wheezing.  Symptoms began several days ago.  Interventions attempted: OTC medications: Coricidin and mucinex, Rescue inhaler, Maintenance inhaler, and Nebulizer treatments.  Symptoms are: gradually worsening.  Triage Disposition: See HCP Within 4 Hours (Or PCP Triage)  Patient/caregiver understands and will follow disposition?: No, wishes to speak with PCP   Talked to MD on Wednesday night on the phone. Was told to take covid and flu test which was negative. Was advised to continue prn coricidin and to contact office if she feels worse.   Pt feels worse. Sunday onset of constant cough with yellow sputum. No blood. Intermittent wheezing. Severe SOB with coughing. Baseline has significant SOB, slightly worse. No CP. Body aches. Speaking in clear continuous sentences, no SOB, wheezing or coughing noted over the phone. Temp of 103 F on Wednesday. 98.3 F currently. States she knows she has bronchitis and is only goes away with abxs. Declines appt. Wanting to know if abx can be sent in for her symptoms. Forwarding request to office. Advised UC or ED for worsening symptoms.    [facility] Pulmonary Triage - Initial Assessment Questions Chief Complaint (e.g., cough, sob, wheezing, fever, chills, sweat or additional symptoms) *Go to specific symptom protocol after initial questions. Constant severe cough with intermittent wheezing. Severe SOB with coughing.   How long have symptoms been present? Sunday  Have you tested for COVID or Flu? Note: If not, ask patient if a home test can be taken. If so, instruct patient to call back for positive results. Yes, both negative  MEDICINES:   Have you used any OTC meds to help with symptoms?  Yes If yes, ask What medications? Coricidin and mucinex  Have you used your inhalers/maintenance medication? Yes If yes, What medications? Albuterol  inhaler (2x/day) and albuterol  nebulizer (about 1x/day) and betzri  If inhaler, ask How many puffs and how often? Note: Review instructions on medication in the chart. Albuterol  2 pufss 2x/day  OXYGEN: Do you wear supplemental oxygen? Yes If yes, How many liters are you supposed to use? 2L at night  Do you monitor your oxygen levels? Yes If yes, What is your reading (oxygen level) today? 94%  What is your usual oxygen saturation reading?  (Note: Pulmonary O2 sats should be 90% or greater) 94%    Copied from CRM #[id number]. Topic: Clinical - Red Word Triage >> Jan 02, 2025  8:49 AM Corean SAUNDERS wrote: Red Word that prompted transfer to Nurse Triage: Patient states she has bronchitis, cough, wheezing, chest tightness,and yellow sputum. Reason for Disposition  Wheezing is present  Protocols used: Cough - Acute Productive-A-AH

## 2025-01-02 NOTE — Telephone Encounter (Signed)
 This was handled on 01/01/24 by Izetta.  Nothing further needed.

## 2025-01-02 NOTE — Telephone Encounter (Signed)
 [name], Cimmino addressed this patient's symptomos on 1/7, she felt it was viral.  She was advised to test for covid/flu.  She reports they were both negative.  She has called back again requesting an antibiotic and declining to come in for a visit.  She was advised to be seen at [facility] or ED for worsening symptoms.  Please advise.  Thank you.

## 2025-01-02 NOTE — Telephone Encounter (Signed)
 Patient aware.NFN

## 2025-01-02 NOTE — Telephone Encounter (Signed)
 Sending to [facility] NP who saw patient last 2 visits.

## 2025-01-02 NOTE — Telephone Encounter (Signed)
 Copied from CRM [id number]. Topic: Clinical - Red Word Triage >> Jan 02, 2025  8:49 AM Corean SAUNDERS wrote: Red Word that prompted transfer to Nurse Triage: Patient states she has bronchitis, cough, wheezing, chest tightness,and yellow sputum. >> Jan 02, 2025  1:49 PM Benton O wrote: Patient is calling back concerning her symptoms saying fever is spiking . Patient say she didn't say she wouldn't come just didn't have time to get there at the time of appointment  bronchitis, cough, wheezing, chest tightness,and yellow sputum. Patient didn't want to talk back to nurse triage . Please give patient a call please .   Medication was sent in today patient is aware

## 2025-01-02 NOTE — Telephone Encounter (Signed)
 Patient was last seen in October, followed for COPD with recurrent exacerbations. Lung functoin 64% predicated on previous pulmonary function testing. Maintained on Breztri . Daliresp  approved in October   I will send in Zpack and pred taper for AECOPD  I would also like to increase Daliresp  dose to 500mcg daily  Continue Breztri  two puffs twice daily Continue 2l oxygen at night, can use during the day if needed to maintain >88-90%

## 2025-01-05 ENCOUNTER — Ambulatory Visit: Admitting: Urgent Care

## 2025-01-08 NOTE — Telephone Encounter (Signed)
 Copied from CRM #[id number]. Topic: Clinical - Prescription Issue >> Jan 06, 2025  7:59 AM Russell PARAS wrote: Reason for CRM:   Pt spoke with on 1/9 due to symptoms of bronchitis. She was advised that medication was called in by clinic. She was under the notion that prednisone  would be called in along with the Zpak; however, it wasn't. She reports she typically requires a Zpak and the prednisone  to resolve symptoms.  [facility] [address] And call back with status update  CB#  [phone] >> Jan 06, 2025 12:06 PM Rozanna G wrote: Pt is calling back about the above message, stated she has not gotten a call back, stated she has been calling since last week and no one has called her back.    Looks like the prednisone  was not sent in to her pharmacy with her zithromax ,please advise

## 2025-01-08 NOTE — Telephone Encounter (Signed)
 We did speak with her on 01/03/24 and sent in a zpack which she took. I spoke with her on Tuesday after she called our on-call service.  I have sent in prednisone  taper. NFN

## 2025-01-18 ENCOUNTER — Other Ambulatory Visit: Payer: Self-pay | Admitting: Urgent Care

## 2025-01-18 DIAGNOSIS — I10 Essential (primary) hypertension: Secondary | ICD-10-CM

## 2025-01-20 ENCOUNTER — Ambulatory Visit: Admitting: Urgent Care

## 2025-01-21 ENCOUNTER — Ambulatory Visit: Admitting: *Deleted

## 2025-01-21 ENCOUNTER — Ambulatory Visit: Admitting: Emergency Medicine

## 2025-01-21 ENCOUNTER — Encounter: Payer: Self-pay | Admitting: Emergency Medicine

## 2025-01-21 VITALS — BP 116/60 | HR 90 | Temp 98.2°F | Ht 63.5 in | Wt 133.0 lb

## 2025-01-21 DIAGNOSIS — J439 Emphysema, unspecified: Secondary | ICD-10-CM | POA: Diagnosis not present

## 2025-01-21 DIAGNOSIS — Z87891 Personal history of nicotine dependence: Secondary | ICD-10-CM | POA: Diagnosis not present

## 2025-01-21 DIAGNOSIS — J449 Chronic obstructive pulmonary disease, unspecified: Secondary | ICD-10-CM

## 2025-01-21 DIAGNOSIS — J4489 Other specified chronic obstructive pulmonary disease: Secondary | ICD-10-CM | POA: Diagnosis not present

## 2025-01-21 DIAGNOSIS — C3411 Malignant neoplasm of upper lobe, right bronchus or lung: Secondary | ICD-10-CM | POA: Diagnosis not present

## 2025-01-21 DIAGNOSIS — C3491 Malignant neoplasm of unspecified part of right bronchus or lung: Secondary | ICD-10-CM

## 2025-01-21 NOTE — Progress Notes (Signed)
 Spirometry pre/post and diffusion capacity performed today.Patient used Albuterol  and Breztri  2 hours before appt provider deferred post.

## 2025-01-21 NOTE — Patient Instructions (Addendum)
 We reviewed your pulmonary function testing today. Please continue Breztri  2 puffs twice a day.  Rinse and gargle after use. Keep albuterol  available to use 2 puffs when needed for shortness of breath, chest tightness, wheezing. Flu shot up-to-date Continue oxygen at 2 L/min while sleeping We will perform a walking oximetry on room air today to see if you qualify for supplemental oxygen with exertion Get your CT scan of the chest in April 2026 as planned and follow-up with Dr. Shannon.  Follow Dr. Shelah in April 2026.  Please message sooner if you have any problems.

## 2025-01-21 NOTE — Patient Instructions (Signed)
 Spirometry pre/post and diffusion capacity performed today.

## 2025-01-21 NOTE — Assessment & Plan Note (Signed)
 Slight decrease in her FEV1 compared with 2020.  Overall reassuring pulmonary function testing.  She does have significant exertional dyspnea.  Will recheck to ensure no evidence of exertional desaturation.  She did not desat when she was checked in October 2024.  We reviewed your pulmonary function testing today. Please continue Breztri  2 puffs twice a day.  Rinse and gargle after use. Keep albuterol  available to use 2 puffs when needed for shortness of breath, chest tightness, wheezing. Flu shot up-to-date Continue oxygen at 2 L/min while sleeping We will perform a walking oximetry on room air today to see if you qualify for supplemental oxygen with exertion

## 2025-01-21 NOTE — Assessment & Plan Note (Signed)
 Adenocarcinoma treated by SBRT.  Her surveillance imaging will be due in April 2026.  Plan to follow-up to review

## 2025-01-21 NOTE — Progress Notes (Signed)
 "  Subjective:    Patient ID: [name], female    DOB: [birth year], 64 y.o.   MRN: [id number]  HPI 63 year old woman who has been followed by Dr. 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 Currently managed on Breztri .  Using albuterol  about 2x a day.  She just got over a flare that started early January - was treated with prednisone  + azithro. She has improved - still with some cough. She has exertional SOB w most activity - walking, showering. She flares about 2x a year. Flu shot up to date.   CT chest 10/02/2024 reviewed by me, showed postradiation bandlike fibrosis in the right upper lobe stable compared with priors.  Scattered pulmonary nodules all stable in size and appearance.  No evidence of new nodularity or progressive disease.  There was a new right lateral fourth rib fracture without any evidence of metastatic disease.  Pulmonary function testing performed today and reviewed by me shows severe obstruction with an FEV1 of 1.31 L (55% predicted), normal lung volumes which could suggest pseudonormalization and associated restriction, decreased diffusion capacity.  The FEV1 has decreased compared with 10/28/2019.   Review of Systems As per HPI  Past Medical History:  Diagnosis Date   Allergy    codeine,topamax,lyrica   Anxiety    Arthritis    Cancer of cervix (HCC)    Cataract    Cervical spondylosis without myelopathy 08/04/2013   COPD (chronic obstructive pulmonary disease) (HCC)    Coronary artery calcification    DDD (degenerative disc disease), lumbar    Depression    Dyspnea    on exertion   Emphysema of lung (HCC)    Fibromyalgia    GERD (gastroesophageal reflux disease)    History of radiation therapy    Right lung- 08/13/23-08/20/23- Dr. Lynwood Nasuti   Migraine without aura, with intractable migraine, so stated, without mention of status  migrainosus 12/2020   Osteoporosis    Scoliosis      Family History  Problem Relation Age of Onset   Cancer Father    Cancer Mother      Social History   Socioeconomic History   Marital status: Married    Spouse name: Sharolyn    Number of children: 1   Years of education: 9   Highest education level: 12th grade  Occupational History    Comment: Does not work  Tobacco Use   Smoking status: Former    Current packs/day: 0.00    Average packs/day: 1 pack/day for 49.0 years (49.0 ttl pk-yrs)    Types: Cigarettes    Quit date: 08/09/2023    Years since quitting: 1.4   Smokeless tobacco: Never  Vaping Use   Vaping status: Never Used  Substance and Sexual Activity   Alcohol use: Yes    Alcohol/week: 9.0 standard drinks of alcohol    Types: 2 Cans of beer, 7 Standard drinks or equivalent per week    Comment: beer/mixed drinks   Drug use: No   Sexual activity: Not Currently    Birth control/protection: Post-menopausal    Comment: Hysterectomy  Other Topics Concern   Not on file  Social History Narrative   Patient lives at home Sharolyn her husband.    Patient has 1 child.    Patient has 9 th grade education.    Patient is currently not currently working.  Social Drivers of Health   Tobacco Use: Medium Risk (01/21/2025)   Patient History    Smoking Tobacco Use: Former    Smokeless Tobacco Use: Never    Passive Exposure: Not on file  Financial Resource Strain: Low Risk (02/17/2024)   Overall Financial Resource Strain (CARDIA)    Difficulty of Paying Living Expenses: Not very hard  Food Insecurity: No Food Insecurity (02/17/2024)   Hunger Vital Sign    Worried About Running Out of Food in the Last Year: Never true    Ran Out of Food in the Last Year: Never true  Transportation Needs: No Transportation Needs (02/17/2024)   PRAPARE - Administrator, Civil Service (Medical): No    Lack of Transportation (Non-Medical): No  Physical Activity: Inactive  (02/17/2024)   Exercise Vital Sign    Days of Exercise per Week: 0 days    Minutes of Exercise per Session: 0 min  Stress: No Stress Concern Present (02/17/2024)   [employer] of Occupational Health - Occupational Stress Questionnaire    Feeling of Stress : Only a little  Social Connections: Unknown (02/17/2024)   Social Connection and Isolation Panel    Frequency of Communication with Friends and Family: More than three times a week    Frequency of Social Gatherings with Friends and Family: Patient declined    Attends Religious Services: Patient declined    Database Administrator or Organizations: No    Attends Engineer, Structural: Not on file    Marital Status: Married  Catering Manager Violence: Not At Risk (06/26/2023)   Humiliation, Afraid, Rape, and Kick questionnaire    Fear of Current or Ex-Partner: No    Emotionally Abused: No    Physically Abused: No    Sexually Abused: No  Depression (PHQ2-9): Low Risk (06/20/2024)   Depression (PHQ2-9)    PHQ-2 Score: 0  Alcohol Screen: Low Risk (02/17/2024)   Alcohol Screen    Last Alcohol Screening Score (AUDIT): 3  Housing: Low Risk (02/17/2024)   Housing Stability Vital Sign    Unable to Pay for Housing in the Last Year: No    Number of Times Moved in the Last Year: 0    Homeless in the Last Year: No  Utilities: Not At Risk (11/14/2023)   AHC Utilities    Threatened with loss of utilities: No  Health Literacy: Not on file     Allergies[1]   Outpatient Medications Prior to Visit  Medication Sig Dispense Refill   AIMOVIG 140 MG/ML SOAJ      albuterol  (PROVENTIL ) (2.5 MG/3ML) 0.083% nebulizer solution Take 3 mLs (2.5 mg total) by nebulization every 6 (six) hours as needed for wheezing or shortness of breath. 75 mL 2   albuterol  (VENTOLIN  HFA) 108 (90 Base) MCG/ACT inhaler INHALE 1 TO 2 PUFFS EVERY 6 HOURS AS NEEDED FOR WHEEZING OR SHORTNESS OF BREATH. 54 g 3   amLODipine  (NORVASC ) 5 MG tablet TAKE 1 TABLET (5 MG  TOTAL) BY MOUTH DAILY. 15 tablet 0   aspirin  EC 81 MG tablet Take 81 mg by mouth daily. Swallow whole.     atorvastatin  (LIPITOR) 20 MG tablet TAKE 1 TABLET BY MOUTH EVERY DAY 90 tablet 1   azithromycin  (ZITHROMAX ) 250 MG tablet Per zpack instruction 6 tablet 0   Biotin 1000 MCG CHEW Chew 1,000 mcg by mouth daily.     budesonide -glycopyrrolate -formoterol  (BREZTRI  AEROSPHERE) 160-9-4.8 MCG/ACT AERO inhaler Inhale 2 puffs into the lungs 2 (two) times daily.  10.7 g 6   cyanocobalamin (VITAMIN B12) 1000 MCG tablet Take 2,000 mcg by mouth daily.     diclofenac (VOLTAREN) 75 MG EC tablet Take 75 mg by mouth 2 (two) times daily.     Erenumab-aooe (AIMOVIG, 140 MG DOSE, [place]) Inject 140 mg into the skin every 30 (thirty) days.     famotidine  (PEPCID ) 20 MG tablet TAKE 1 TABLET (20 MG TOTAL) BY MOUTH 2 (TWO) TIMES DAILY AS NEEDED FOR HEARTBURN OR INDIGESTION. 180 tablet 1   gabapentin  (NEURONTIN ) 300 MG capsule Take 300 mg by mouth at bedtime.     hydrOXYzine  (ATARAX ) 25 MG tablet Oral     hydrOXYzine  (VISTARIL ) 25 MG capsule Take 25 mg by mouth 2 (two) times daily.     lamoTRIgine (LAMICTAL) 100 MG tablet Take 100 mg by mouth 2 (two) times daily.      lisinopril  (ZESTRIL ) 10 MG tablet Take 1 tablet (10 mg total) by mouth daily. 90 tablet 0   mirtazapine  (REMERON ) 45 MG tablet Take 1 tablet (45 mg total) by mouth at bedtime.     ondansetron  (ZOFRAN ) 4 MG tablet Take 4 mg by mouth every 8 (eight) hours as needed for nausea or vomiting.     Oxycodone  HCl 10 MG TABS Take 10 mg by mouth every 6 (six) hours.     roflumilast  (DALIRESP ) 500 MCG TABS tablet Take 1 tablet (500 mcg total) by mouth daily. 30 tablet 11   SUMAtriptan  (IMITREX ) 100 MG tablet Take one tab as needed for migraine. May repeat in 2 hours if headache persists or recurs. Do not exceed 2 tabs in 24 hours 12 tablet 2   tiZANidine  (ZANAFLEX ) 4 MG tablet Take 4 mg by mouth 3 (three) times daily.     triamcinolone  cream (KENALOG ) 0.5 % Apply 1  Application topically 2 (two) times daily. To affected areas.Do not exceed 14 days consecutively. 30 g 3   No facility-administered medications prior to visit.        Objective:   Physical Exam  Vitals:   01/21/25 1510  BP: 116/60  Pulse: 90  Temp: 98.2 F (36.8 C)  TempSrc: Oral  SpO2: 96%  Weight: 133 lb (60.3 kg)  Height: 5' 3.5 (1.613 m)    Gen: Pleasant, well-nourished, in no distress,  normal affect  ENT: No lesions,  mouth clear,  oropharynx clear, no postnasal drip  Neck: No JVD, no stridor  Lungs: No use of accessory muscles, distant.  Bilateral end expiratory soft wheezes  Cardiovascular: RRR, heart sounds normal, no murmur or gallops, no peripheral edem  Musculoskeletal: No deformities, no cyanosis or clubbing  Neuro: alert, awake, non focal  Skin: Warm, no lesions or rash      Assessment & Plan:   COPD with chronic bronchitis and emphysema (HCC) Slight decrease in her FEV1 compared with 2020.  Overall reassuring pulmonary function testing.  She does have significant exertional dyspnea.  Will recheck to ensure no evidence of exertional desaturation.  She did not desat when she was checked in October 2024.  We reviewed your pulmonary function testing today. Please continue Breztri  2 puffs twice a day.  Rinse and gargle after use. Keep albuterol  available to use 2 puffs when needed for shortness of breath, chest tightness, wheezing. Flu shot up-to-date Continue oxygen at 2 L/min while sleeping We will perform a walking oximetry on room air today to see if you qualify for supplemental oxygen with exertion  Malignant neoplasm of right upper lobe of  lung (HCC) Adenocarcinoma treated by SBRT.  Her surveillance imaging will be due in April 2026.  Plan to follow-up to review  I personally spent a total of 40 minutes in the care of the patient today including preparing to see the patient, getting/reviewing separately obtained history, performing a medically  appropriate exam/evaluation, counseling and educating, placing orders, documenting clinical information in the EHR, independently interpreting results, and communicating results.   Lamar Chris, MD, PhD 01/21/2025, 3:43 PM [facility] [phone] or if no answer before 7:00PM call [phone] For any issues after 7:00PM please call eLink [phone]      [1]  Allergies Allergen Reactions   Codeine Nausea Only   Topamax [Topiramate] Hives   Lyrica [Pregabalin] Swelling   "

## 2025-01-22 ENCOUNTER — Telehealth: Payer: Self-pay

## 2025-01-22 ENCOUNTER — Ambulatory Visit

## 2025-01-22 ENCOUNTER — Other Ambulatory Visit: Payer: Self-pay | Admitting: Radiation Oncology

## 2025-01-22 LAB — PULMONARY FUNCTION TEST
DL/VA % pred: 78 %
DL/VA: 3.3 ml/min/mmHg/L
DLCO cor % pred: 71 %
DLCO cor: 13.91 ml/min/mmHg
DLCO unc % pred: 71 %
DLCO unc: 13.91 ml/min/mmHg
FEF 25-75 Pre: 0.56 L/s
FEF2575-%Pred-Pre: 26 %
FEV1-%Pred-Pre: 55 %
FEV1-Pre: 1.31 L
FEV1FVC-%Pred-Pre: 72 %
FEV6-%Pred-Pre: 77 %
FEV6-Pre: 2.31 L
FEV6FVC-%Pred-Pre: 101 %
FVC-%Pred-Pre: 76 %
FVC-Pre: 2.37 L
Pre FEV1/FVC ratio: 55 %
Pre FEV6/FVC Ratio: 98 %
RV % pred: 131 %
RV: 2.7 L
TLC % pred: 106 %
TLC: 5.3 L

## 2025-01-22 MED ORDER — GABAPENTIN 100 MG PO CAPS: 100.0000 mg | ORAL_CAPSULE | Freq: Three times a day (TID) | ORAL | 1 refills | Status: AC

## 2025-01-22 NOTE — Telephone Encounter (Signed)
 Patient called in requesting refill on Gabapentin  100mg . Patient continues to have pain under right breast. Requesting prescription be sent to [facility] on [address]

## 2025-01-26 ENCOUNTER — Ambulatory Visit: Admitting: Urgent Care

## 2025-01-27 ENCOUNTER — Ambulatory Visit: Payer: Self-pay | Admitting: Emergency Medicine

## 2025-01-27 NOTE — Telephone Encounter (Signed)
 Attempted to call [facility] to let them know of ER Refusal---No answer at [facility] at this time

## 2025-01-27 NOTE — Telephone Encounter (Signed)
 Called and spoke with the pt and offered ov for today  She declined, stating she is feeling improved today  I asked her to call back for appt if needed or seek emergent care  Nothing further needed

## 2025-01-28 ENCOUNTER — Encounter: Payer: Self-pay | Admitting: Emergency Medicine

## 2025-01-28 ENCOUNTER — Ambulatory Visit: Admitting: Emergency Medicine

## 2025-01-28 ENCOUNTER — Ambulatory Visit

## 2025-01-28 VITALS — BP 124/58 | HR 101 | Temp 99.3°F | Ht 62.0 in | Wt 135.8 lb

## 2025-01-28 DIAGNOSIS — C3411 Malignant neoplasm of upper lobe, right bronchus or lung: Secondary | ICD-10-CM | POA: Diagnosis not present

## 2025-01-28 DIAGNOSIS — R079 Chest pain, unspecified: Secondary | ICD-10-CM | POA: Insufficient documentation

## 2025-01-28 DIAGNOSIS — R0789 Other chest pain: Secondary | ICD-10-CM | POA: Diagnosis not present

## 2025-01-28 DIAGNOSIS — Z720 Tobacco use: Secondary | ICD-10-CM

## 2025-01-28 DIAGNOSIS — R0782 Intercostal pain: Secondary | ICD-10-CM

## 2025-01-28 DIAGNOSIS — J4489 Other specified chronic obstructive pulmonary disease: Secondary | ICD-10-CM

## 2025-01-28 DIAGNOSIS — J439 Emphysema, unspecified: Secondary | ICD-10-CM | POA: Diagnosis not present

## 2025-01-28 DIAGNOSIS — F17210 Nicotine dependence, cigarettes, uncomplicated: Secondary | ICD-10-CM | POA: Diagnosis not present

## 2025-01-28 MED ORDER — IBUPROFEN 800 MG PO TABS: 800.0000 mg | ORAL_TABLET | Freq: Two times a day (BID) | ORAL | 0 refills | Status: AC | PRN

## 2025-01-28 NOTE — Assessment & Plan Note (Signed)
 Desperately needs to decrease her cigarettes, work on smoking cessation.

## 2025-01-28 NOTE — Assessment & Plan Note (Signed)
 Continue the same inhaler regimen

## 2025-01-28 NOTE — Patient Instructions (Addendum)
 Chest x-ray now Please continue your inhaled medication as you have been taking Continue your maintenance oxycodone  as needed for your pain control. Add ibuprofen  800 mg up to every 12 hours as needed for pain You will have to be conservative with movement of your right arm, laying on your right side.  Also try to minimize cough to the best of your ability. You need to work on stopping smoking if at all possible.  This is contributing to cough, probably exacerbating your pain. We are planning for repeat CT scan of the chest in April Follow Dr. Shelah in April.  Please call if you worsen in any way so we see you sooner.[name]

## 2025-01-28 NOTE — Progress Notes (Signed)
 "  Subjective:    Patient ID: [name], female    DOB: 08-27-1961, 64 y.o.   MRN: [id number]  HPI 64 year old woman who has been followed by Dr. 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 Currently managed on Breztri .  Using albuterol  about 2x a day.  She just got over a flare that started early January - was treated with prednisone  + azithro. She has improved - still with some cough. She has exertional SOB w most activity - walking, showering. She flares about 2x a year. Flu shot up to date.   CT chest 10/02/2024 reviewed by me, showed postradiation bandlike fibrosis in the right upper lobe stable compared with priors.  Scattered pulmonary nodules all stable in size and appearance.  No evidence of new nodularity or progressive disease.  There was a new right lateral fourth rib fracture without any evidence of metastatic disease.  Pulmonary function testing performed today and reviewed by me shows severe obstruction with an FEV1 of 1.31 L (55% predicted), normal lung volumes which could suggest pseudonormalization and associated restriction, decreased diffusion capacity.  The FEV1 has decreased compared with 10/28/2019.  Acute office visit 01/28/2025 --acute visit for 64 year old woman with severe COPD, history of right upper lobe adenocarcinoma treated by SBRT with some associated postradiation scarring.  She was treated in early January for an acute flare with prednisone  and azithromycin .  I saw her about 1 week ago to review CT chest from October that showed stable scattered pulmonary nodules without any evidence of new nodular disease.  There was a new right lateral fourth rib fracture without any correlate to support metastatic disease. She underwent walking oximetry at our visit that did not show any evidence of desaturation although she was tachycardic.  She uses 2 L/min  while sleeping. She presents today reporting that she has increased chest pain over the last several days. Through to her R shoulder. Pleuritic, worse on inspiration. Her cough is better.    Review of Systems As per HPI      Objective:   Physical Exam  Vitals:   01/28/25 1534  BP: (!) 124/58  Pulse: (!) 101  Temp: 99.3 F (37.4 C)  SpO2: 93%  Weight: 135 lb 12.8 oz (61.6 kg)  Height: 5' 2 (1.575 m)     Gen: Pleasant, well-nourished, in no distress,  normal affect  ENT: No lesions,  mouth clear,  oropharynx clear, no postnasal drip  Neck: No JVD, no stridor  Lungs: No use of accessory muscles, distant.  Bilateral end expiratory soft wheezes  Cardiovascular: RRR, heart sounds normal, no murmur or gallops, no peripheral edem  Musculoskeletal: No deformities, no cyanosis or clubbing  Neuro: alert, awake, non focal  Skin: Warm, no lesions or rash      Assessment & Plan:   Chest pain Progressive inspiratory and musculoskeletal chest pain in the right upper chest to the axilla and also scapula.  Suspect that this relates to her identified cracked rib (I presume from coughing with a recent bronchitis).  Must talk to so consider possible progression of that injury to a true fracture, pneumothorax, other infiltrative process.  She needs a chest x-ray now and we will perform.  Unfortunately if this is from the cracked rib we will have to wait and watch for it to heal.  Try to minimize coughing, try to minimize movement or  laying on the area.  If she has a pneumothorax then we will address appropriately.  Tobacco abuse Desperately needs to decrease her cigarettes, work on smoking cessation.  COPD with chronic bronchitis and emphysema (HCC) Continue the same inhaler regimen  Malignant neoplasm of right upper lobe of lung (HCC) Due for repeat CT scan of the chest in April.  We will follow-up after to review.   I personally spent a total of 40 minutes in the care of the  patient today including preparing to see the patient, getting/reviewing separately obtained history, performing a medically appropriate exam/evaluation, counseling and educating, placing orders, documenting clinical information in the EHR, independently interpreting results, and communicating results.   Lamar Chris, MD, PhD 01/28/2025, 3:54 PM [facility] [phone] or if no answer before 7:00PM call [phone] For any issues after 7:00PM please call eLink [phone]    "

## 2025-01-28 NOTE — Assessment & Plan Note (Signed)
 Progressive inspiratory and musculoskeletal chest pain in the right upper chest to the axilla and also scapula.  Suspect that this relates to her identified cracked rib (I presume from coughing with a recent bronchitis).  Must talk to so consider possible progression of that injury to a true fracture, pneumothorax, other infiltrative process.  She needs a chest x-ray now and we will perform.  Unfortunately if this is from the cracked rib we will have to wait and watch for it to heal.  Try to minimize coughing, try to minimize movement or laying on the area.  If she has a pneumothorax then we will address appropriately.

## 2025-01-28 NOTE — Assessment & Plan Note (Signed)
 Due for repeat CT scan of the chest in April.  We will follow-up after to review.

## 2025-02-03 ENCOUNTER — Ambulatory Visit

## 2025-04-16 ENCOUNTER — Ambulatory Visit: Admitting: Radiation Oncology

## 2025-04-22 ENCOUNTER — Ambulatory Visit: Admitting: Emergency Medicine

## 2025-05-04 ENCOUNTER — Ambulatory Visit (HOSPITAL_BASED_OUTPATIENT_CLINIC_OR_DEPARTMENT_OTHER): Admitting: Nurse Practitioner
